# Patient Record
Sex: Male | Born: 1950
Health system: Southern US, Community
[De-identification: ages and names within clinical notes are randomized; demographics above are authoritative.]

## PROBLEM LIST (undated history)

## (undated) DIAGNOSIS — Z87891 Personal history of nicotine dependence: Principal | ICD-10-CM

## (undated) DIAGNOSIS — I10 Essential (primary) hypertension: Secondary | ICD-10-CM

## (undated) DIAGNOSIS — E785 Hyperlipidemia, unspecified: Secondary | ICD-10-CM

## (undated) DIAGNOSIS — Z8639 Personal history of other endocrine, nutritional and metabolic disease: Secondary | ICD-10-CM

## (undated) DIAGNOSIS — G4730 Sleep apnea, unspecified: Secondary | ICD-10-CM

## (undated) DIAGNOSIS — I2510 Atherosclerotic heart disease of native coronary artery without angina pectoris: Secondary | ICD-10-CM

## (undated) DIAGNOSIS — I4821 Permanent atrial fibrillation: Secondary | ICD-10-CM

## (undated) DIAGNOSIS — F32A Depression, unspecified: Secondary | ICD-10-CM

## (undated) DIAGNOSIS — K219 Gastro-esophageal reflux disease without esophagitis: Secondary | ICD-10-CM

## (undated) DIAGNOSIS — Z9289 Personal history of other medical treatment: Secondary | ICD-10-CM

## (undated) DIAGNOSIS — F329 Major depressive disorder, single episode, unspecified: Secondary | ICD-10-CM

## (undated) HISTORY — DX: Personal history of other medical treatment: Z92.89

## (undated) HISTORY — PX: HERNIA REPAIR: SHX51

## (undated) HISTORY — PX: TUMOR EXCISION: SHX421

## (undated) HISTORY — PX: APPENDECTOMY: SHX54

## (undated) HISTORY — PX: ANKLE SURGERY: SHX546

## (undated) HISTORY — DX: Personal history of nicotine dependence: Z87.891

## (undated) HISTORY — DX: Permanent atrial fibrillation: I48.21

---

## 2005-08-29 ENCOUNTER — Other Ambulatory Visit: Payer: Self-pay

## 2005-08-29 ENCOUNTER — Observation Stay: Payer: Self-pay | Admitting: Internal Medicine

## 2009-04-21 ENCOUNTER — Ambulatory Visit: Payer: Self-pay | Admitting: Surgery

## 2009-04-28 ENCOUNTER — Ambulatory Visit: Payer: Self-pay | Admitting: Surgery

## 2013-03-11 LAB — LIPID PANEL
Cholesterol: 162 mg/dL (ref 0–200)
HDL: 42 mg/dL (ref 35–70)
LDL Cholesterol: 110 mg/dL
LDL/HDL RATIO: 2.6
Triglycerides: 49 mg/dL (ref 40–160)

## 2013-03-11 LAB — HEPATIC FUNCTION PANEL
ALK PHOS: 76 U/L (ref 25–125)
ALT: 32 U/L (ref 10–40)
AST: 28 U/L (ref 14–40)
Bilirubin, Total: 1.6 mg/dL

## 2013-03-11 LAB — CBC AND DIFFERENTIAL
HCT: 47 % (ref 41–53)
Hemoglobin: 15.8 g/dL (ref 13.5–17.5)
Neutrophils Absolute: 63 /uL
Platelets: 119 10*3/uL — AB (ref 150–399)
WBC: 5.5 10^3/mL

## 2013-03-11 LAB — TSH: TSH: 1.84 u[IU]/mL (ref <=5.90)

## 2013-03-11 LAB — BASIC METABOLIC PANEL
BUN: 16 mg/dL (ref 4–21)
Creatinine: 0.9 mg/dL (ref <=1.3)
Glucose: 93 mg/dL
POTASSIUM: 4.3 mmol/L (ref 3.4–5.3)
Sodium: 144 mmol/L (ref 137–147)

## 2013-03-11 LAB — PSA: PSA: 0.5

## 2015-01-27 DIAGNOSIS — Z87891 Personal history of nicotine dependence: Secondary | ICD-10-CM | POA: Insufficient documentation

## 2015-01-27 DIAGNOSIS — G4700 Insomnia, unspecified: Secondary | ICD-10-CM | POA: Insufficient documentation

## 2015-01-27 DIAGNOSIS — K219 Gastro-esophageal reflux disease without esophagitis: Secondary | ICD-10-CM | POA: Insufficient documentation

## 2015-01-27 DIAGNOSIS — I2510 Atherosclerotic heart disease of native coronary artery without angina pectoris: Secondary | ICD-10-CM | POA: Insufficient documentation

## 2015-01-27 DIAGNOSIS — E669 Obesity, unspecified: Secondary | ICD-10-CM | POA: Insufficient documentation

## 2015-01-27 DIAGNOSIS — D179 Benign lipomatous neoplasm, unspecified: Secondary | ICD-10-CM | POA: Insufficient documentation

## 2015-01-27 DIAGNOSIS — G4733 Obstructive sleep apnea (adult) (pediatric): Secondary | ICD-10-CM | POA: Insufficient documentation

## 2015-01-27 DIAGNOSIS — F32A Depression, unspecified: Secondary | ICD-10-CM | POA: Insufficient documentation

## 2015-01-27 DIAGNOSIS — I10 Essential (primary) hypertension: Secondary | ICD-10-CM | POA: Insufficient documentation

## 2015-01-27 DIAGNOSIS — F329 Major depressive disorder, single episode, unspecified: Secondary | ICD-10-CM | POA: Insufficient documentation

## 2015-01-27 DIAGNOSIS — E785 Hyperlipidemia, unspecified: Secondary | ICD-10-CM | POA: Insufficient documentation

## 2015-02-01 ENCOUNTER — Ambulatory Visit: Payer: Self-pay | Admitting: Family Medicine

## 2015-02-02 ENCOUNTER — Ambulatory Visit (INDEPENDENT_AMBULATORY_CARE_PROVIDER_SITE_OTHER): Payer: Medicare Other | Admitting: Family Medicine

## 2015-02-02 ENCOUNTER — Encounter: Payer: Self-pay | Admitting: Family Medicine

## 2015-02-02 VITALS — BP 168/100 | HR 68 | Temp 98.7°F | Resp 14 | Ht 69.0 in | Wt 217.0 lb

## 2015-02-02 DIAGNOSIS — I10 Essential (primary) hypertension: Secondary | ICD-10-CM

## 2015-02-02 DIAGNOSIS — F32A Depression, unspecified: Secondary | ICD-10-CM

## 2015-02-02 DIAGNOSIS — F329 Major depressive disorder, single episode, unspecified: Secondary | ICD-10-CM

## 2015-02-02 DIAGNOSIS — K4090 Unilateral inguinal hernia, without obstruction or gangrene, not specified as recurrent: Secondary | ICD-10-CM | POA: Diagnosis not present

## 2015-02-02 NOTE — Progress Notes (Signed)
Patient ID: Louis Luna, male   DOB: 26-Mar-1951, 64 y.o.   MRN: [id number]   Patient: Louis Luna Male    DOB: August 11, 1951   64 y.o.   MRN: [id number] Visit Date: 02/02/2015  Today's Provider: [name], MD   Chief Complaint  Patient presents with  . Skin Problem   Subjective:    HPI   Patient states he has some irritation in the lower right abdomen and close to right groin area. He notices burning sensation there with prolong standing. This does not bother him daily but comes and goes. No rash noted. He does state he had hernia surgery about 6 years ago and wonders if this sensation related to the scar. He does mention that he has MESH there. This has been bothering him for about 3 to 4 months.   Previous Medications   ASPIRIN 81 MG TABLET    Take by mouth.   ATENOLOL (TENORMIN) 50 MG TABLET    Take by mouth.   CETIRIZINE (ZYRTEC) 10 MG TABLET    Take by mouth.   CINNAMON 500 MG CAPSULE    Take by mouth.   DIAZEPAM (VALIUM) 5 MG TABLET    Take by mouth.   MULTIPLE VITAMIN PO    Take by mouth.   PSYLLIUM 48.57 % POWD    Take by mouth.   SERTRALINE (ZOLOFT) 50 MG TABLET    Take by mouth.    Review of Systems  Constitutional: Negative.   Respiratory: Negative.   Cardiovascular: Negative.   Genitourinary: Negative.   Skin: Negative.     History  Substance Use Topics  . Smoking status: Former Smoker -- 1.00 packs/day for 30 years    Types: Cigarettes    Quit date: 10/26/2011  . Smokeless tobacco: Never Used  . Alcohol Use: Yes     Comment: ocassionally-beer   Objective:   BP 168/100 mmHg  Pulse 68  Temp(Src) 98.7 F (37.1 C)  Resp 14  Ht 5\' 9"  (1.753 m)  Wt 217 lb (98.431 kg)  BMI 32.03 kg/m2  Physical Exam  Constitutional: He appears well-developed and well-nourished.  Eyes: Conjunctivae are normal. Pupils are equal, round, and reactive to light.  Neck: Normal range of motion. Neck supple.  Cardiovascular: Normal rate, regular rhythm, normal  heart sounds and intact distal pulses.   No murmur heard. Pulmonary/Chest: Effort normal and breath sounds normal.  Abdominal: Soft. There is no tenderness. A hernia is present. Hernia confirmed positive in the right inguinal area (moderate size present).  Genitourinary: Penis normal.  Skin: Skin is dry. No rash noted. No erythema.        Assessment & Plan:     Inguinal hernia, right - New. Plan: Ambulatory referral to General Surgery. Discussed with patient that he does not have to have surgery but it would be good t ogo for consultation. He did have hernia surgery in 2010. Refer back to Dr. Tamala Julian.  Essential hypertension -elevated today. Patient has not taking his B/P medication yet this morning. Advised patient to exercise as he wanted today and right after that take his medication. Follow for now. No changes. Re check on the next visit.  Clinical depression-stable.     Follow up: 4 months.

## 2015-03-13 ENCOUNTER — Other Ambulatory Visit: Payer: Self-pay | Admitting: Family Medicine

## 2015-03-22 ENCOUNTER — Other Ambulatory Visit: Payer: Self-pay | Admitting: Family Medicine

## 2015-04-04 ENCOUNTER — Telehealth: Payer: Self-pay | Admitting: Family Medicine

## 2015-04-04 NOTE — Telephone Encounter (Signed)
Pt's wife would like Dr. Rosanna Randy to write a letter stating that due to pt's hernia he can not travel. Wife stated that they were supposed to go on a cruise in September but because of pt having the hernia they can not go. Wife stated that pt is going to have surgery but hasn't scheduled it and he hasn't seen anyone other than Dr. Rosanna Randy in June about the hernia. Wife stated they need the letter to give to the company they booked the cruise with. Thanks TNP

## 2015-04-04 NOTE — Telephone Encounter (Signed)
See below please-aa 

## 2015-04-15 NOTE — Telephone Encounter (Signed)
Okay to write a letter for me to sign saying that he has a hernia. I'm not sure I cannot not told him he could not go on a cruise. He does need to follow-up with surgery and make a plan for how to deal with this.

## 2015-04-20 NOTE — Telephone Encounter (Signed)
Pt's wife returned Ana's call and would like a call back. Thanks TNP

## 2015-04-20 NOTE — Telephone Encounter (Signed)
[id number] 

## 2015-04-20 NOTE — Telephone Encounter (Signed)
Spoke with [name], wife, advised as below. No note needed-aa

## 2015-05-04 ENCOUNTER — Encounter: Payer: Self-pay | Admitting: Emergency Medicine

## 2015-05-04 ENCOUNTER — Inpatient Hospital Stay
Admission: EM | Admit: 2015-05-04 | Discharge: 2015-05-08 | DRG: 502 | Disposition: A | Discharge status: Home Health | Payer: Medicare Other | Attending: Orthopedic Surgery | Admitting: Orthopedic Surgery

## 2015-05-04 ENCOUNTER — Inpatient Hospital Stay: Payer: Medicare Other

## 2015-05-04 ENCOUNTER — Telehealth: Payer: Self-pay | Admitting: Family Medicine

## 2015-05-04 ENCOUNTER — Emergency Department: Payer: Medicare Other

## 2015-05-04 DIAGNOSIS — F329 Major depressive disorder, single episode, unspecified: Secondary | ICD-10-CM | POA: Diagnosis present

## 2015-05-04 DIAGNOSIS — Z803 Family history of malignant neoplasm of breast: Secondary | ICD-10-CM | POA: Diagnosis not present

## 2015-05-04 DIAGNOSIS — M25562 Pain in left knee: Secondary | ICD-10-CM | POA: Diagnosis not present

## 2015-05-04 DIAGNOSIS — Z87891 Personal history of nicotine dependence: Secondary | ICD-10-CM

## 2015-05-04 DIAGNOSIS — S76119A Strain of unspecified quadriceps muscle, fascia and tendon, initial encounter: Secondary | ICD-10-CM | POA: Diagnosis present

## 2015-05-04 DIAGNOSIS — Z7982 Long term (current) use of aspirin: Secondary | ICD-10-CM | POA: Diagnosis not present

## 2015-05-04 DIAGNOSIS — I10 Essential (primary) hypertension: Secondary | ICD-10-CM | POA: Diagnosis present

## 2015-05-04 DIAGNOSIS — Z820 Family history of epilepsy and other diseases of the nervous system: Secondary | ICD-10-CM | POA: Diagnosis not present

## 2015-05-04 DIAGNOSIS — G4733 Obstructive sleep apnea (adult) (pediatric): Secondary | ICD-10-CM | POA: Diagnosis present

## 2015-05-04 DIAGNOSIS — E785 Hyperlipidemia, unspecified: Secondary | ICD-10-CM | POA: Diagnosis present

## 2015-05-04 DIAGNOSIS — T148XXA Other injury of unspecified body region, initial encounter: Secondary | ICD-10-CM

## 2015-05-04 DIAGNOSIS — Z79899 Other long term (current) drug therapy: Secondary | ICD-10-CM

## 2015-05-04 DIAGNOSIS — Z8249 Family history of ischemic heart disease and other diseases of the circulatory system: Secondary | ICD-10-CM

## 2015-05-04 DIAGNOSIS — W1781XA Fall down embankment (hill), initial encounter: Secondary | ICD-10-CM | POA: Diagnosis present

## 2015-05-04 DIAGNOSIS — Z811 Family history of alcohol abuse and dependence: Secondary | ICD-10-CM | POA: Diagnosis not present

## 2015-05-04 DIAGNOSIS — Z833 Family history of diabetes mellitus: Secondary | ICD-10-CM | POA: Diagnosis not present

## 2015-05-04 DIAGNOSIS — S76112A Strain of left quadriceps muscle, fascia and tendon, initial encounter: Secondary | ICD-10-CM | POA: Diagnosis present

## 2015-05-04 DIAGNOSIS — Y9353 Activity, golf: Secondary | ICD-10-CM | POA: Diagnosis not present

## 2015-05-04 DIAGNOSIS — I2510 Atherosclerotic heart disease of native coronary artery without angina pectoris: Secondary | ICD-10-CM | POA: Diagnosis present

## 2015-05-04 DIAGNOSIS — Y929 Unspecified place or not applicable: Secondary | ICD-10-CM | POA: Diagnosis not present

## 2015-05-04 LAB — COMPREHENSIVE METABOLIC PANEL
ALBUMIN: 4.4 g/dL (ref 3.5–5.0)
ALT: 26 U/L (ref 17–63)
AST: 36 U/L (ref 15–41)
Alkaline Phosphatase: 75 U/L (ref 38–126)
Anion gap: 8 (ref 5–15)
BUN: 20 mg/dL (ref 6–20)
CHLORIDE: 105 mmol/L (ref 101–111)
CO2: 26 mmol/L (ref 22–32)
CREATININE: 0.87 mg/dL (ref 0.61–1.24)
Calcium: 9.1 mg/dL (ref 8.9–10.3)
GFR calc Af Amer: >60 mL/min (ref >60)
GLUCOSE: 92 mg/dL (ref 65–99)
POTASSIUM: 4.3 mmol/L (ref 3.5–5.1)
Sodium: 139 mmol/L (ref 135–145)
Total Bilirubin: 1.5 mg/dL — ABNORMAL HIGH (ref 0.3–1.2)
Total Protein: 7 g/dL (ref 6.5–8.1)

## 2015-05-04 LAB — CBC
HEMATOCRIT: 41.7 % (ref 40.0–52.0)
HEMOGLOBIN: 14.1 g/dL (ref 13.0–18.0)
MCH: 30 pg (ref 26.0–34.0)
MCHC: 33.7 g/dL (ref 32.0–36.0)
MCV: 89 fL (ref 80.0–100.0)
Platelets: 123 10*3/uL — ABNORMAL LOW (ref 150–440)
RBC: 4.68 MIL/uL (ref 4.40–5.90)
RDW: 17.2 % — ABNORMAL HIGH (ref 11.5–14.5)
WBC: 7.5 10*3/uL (ref 3.8–10.6)

## 2015-05-04 LAB — MRSA PCR SCREENING: MRSA by PCR: NEGATIVE

## 2015-05-04 LAB — PROTIME-INR
INR: 1.37
PROTHROMBIN TIME: 17.1 s — AB (ref 11.4–15.0)

## 2015-05-04 MED ORDER — CHLORHEXIDINE GLUCONATE 4 % EX LIQD: 60.0000 mL | Freq: Once | CUTANEOUS | Status: DC

## 2015-05-04 MED ORDER — SERTRALINE HCL 50 MG PO TABS
50.0000 mg | ORAL_TABLET | Freq: Every day | ORAL | Status: DC
Start: 2015-05-05 — End: 2015-05-08
  Filled 2015-05-04 (×2): qty 1

## 2015-05-04 MED ORDER — ATENOLOL 50 MG PO TABS
50.0000 mg | ORAL_TABLET | Freq: Every day | ORAL | Status: DC
  Administered 2015-05-05: 50 mg via ORAL
  Filled 2015-05-04 (×3): qty 1

## 2015-05-04 MED ORDER — TRAMADOL HCL 50 MG PO TABS
50.0000 mg | ORAL_TABLET | Freq: Once | ORAL | Status: AC
  Administered 2015-05-04: 50 mg via ORAL
  Filled 2015-05-04: qty 1

## 2015-05-04 MED ORDER — MORPHINE SULFATE (PF) 2 MG/ML IV SOLN
0.5000 mg | INTRAVENOUS | Status: DC | PRN
  Administered 2015-05-06: 0.5 mg via INTRAVENOUS
  Filled 2015-05-04: qty 1

## 2015-05-04 MED ORDER — IBUPROFEN 800 MG PO TABS
800.0000 mg | ORAL_TABLET | Freq: Once | ORAL | Status: AC
  Administered 2015-05-04: 800 mg via ORAL
  Filled 2015-05-04: qty 1

## 2015-05-04 MED ORDER — DIAZEPAM 5 MG PO TABS
5.0000 mg | ORAL_TABLET | Freq: Every day | ORAL | Status: DC | PRN
  Filled 2015-05-04: qty 1

## 2015-05-04 MED ORDER — IBUPROFEN 800 MG PO TABS: 800.0000 mg | ORAL_TABLET | Freq: Three times a day (TID) | ORAL | Status: DC | PRN

## 2015-05-04 MED ORDER — SODIUM CHLORIDE 0.9 % IV SOLN
INTRAVENOUS | Status: DC
  Administered 2015-05-04 – 2015-05-05 (×4): via INTRAVENOUS

## 2015-05-04 MED ORDER — HYDROCODONE-ACETAMINOPHEN 5-325 MG PO TABS
1.0000 | ORAL_TABLET | Freq: Four times a day (QID) | ORAL | Status: DC | PRN
  Administered 2015-05-05 (×2): 1 via ORAL
  Administered 2015-05-06: 2 via ORAL
  Administered 2015-05-06 (×2): 1 via ORAL
  Administered 2015-05-06: 2 via ORAL
  Filled 2015-05-04: qty 1
  Filled 2015-05-04: qty 2
  Filled 2015-05-04: qty 1
  Filled 2015-05-04: qty 2
  Filled 2015-05-04 (×2): qty 1

## 2015-05-04 MED ORDER — TRAMADOL HCL 50 MG PO TABS: 50.0000 mg | ORAL_TABLET | Freq: Four times a day (QID) | ORAL | Status: DC | PRN

## 2015-05-04 MED ORDER — SODIUM CHLORIDE 0.9 % IV SOLN: INTRAVENOUS | Status: DC

## 2015-05-04 MED ORDER — CEFAZOLIN SODIUM-DEXTROSE 2-3 GM-% IV SOLR
2.0000 g | INTRAVENOUS | Status: DC
  Filled 2015-05-04 (×2): qty 50

## 2015-05-04 NOTE — H&P (Signed)
Subjective:   Patient is a 64 y.o. male presents with left knee injury while golfing. He was stepping down a slope after having a ball go off to the side of the course, and felt a pop and went down with left knee pain. He's been unable to actively extend the knee since then and has noticed swelling and pain. Onset of symptoms was abrupt starting 2 hours ago with unchanged course since that time. The pain is located in the anterior left knee. Patient describes the pain as sharp continuous and rated as severe. Pain has been associated with swelling. Patient denies loss of consciousness. Symptoms are aggravated by any motion of the knee. Symptoms improve with maintaining knee extension. Past history includes no prior left knee surgery.  Previous studies include knee x-rays today showing apparent avulsion injury of the quadriceps.  Patient Active Problem List   Diagnosis Date Noted  . Quadriceps muscle rupture 05/04/2015  . Atherosclerosis of coronary artery 01/27/2015  . Clinical depression 01/27/2015  . Acid reflux 01/27/2015  . History of tobacco use 01/27/2015  . HLD (hyperlipidemia) 01/27/2015  . BP (high blood pressure) 01/27/2015  . Fatty tumor 01/27/2015  . Obstructive sleep apnea 01/27/2015   No past medical history on file.  Past Surgical History  Procedure Laterality Date  . Hernia repair      inguinal-left 04/2009  . Tumor excision      from Rt side of the neck-benign  . Ankle surgery    . Appendectomy      Prescriptions prior to admission  Medication Sig Dispense Refill Last Dose  . aspirin EC 81 MG tablet Take 81 mg by mouth daily.   05/04/2015 at 0800  . atenolol (TENORMIN) 50 MG tablet Take 50 mg by mouth daily.   05/04/2015 at 0800  . diazepam (VALIUM) 5 MG tablet Take 5 mg by mouth daily as needed for anxiety.   05/04/2015 at Unknown time  . Multiple Vitamin (MULTIVITAMIN WITH MINERALS) TABS tablet Take 1 tablet by mouth daily.   05/04/2015 at Unknown time  . psyllium (METAMUCIL  SMOOTH TEXTURE) 28 % packet Take 1 packet by mouth daily.   05/04/2015 at Unknown time  . sertraline (ZOLOFT) 50 MG tablet Take 50 mg by mouth daily.   05/04/2015 at Unknown time   No Known Allergies  Social History  Substance Use Topics  . Smoking status: Former Smoker -- 1.00 packs/day for 30 years    Types: Cigarettes    Quit date: 10/26/2011  . Smokeless tobacco: Never Used  . Alcohol Use: Yes     Comment: ocassionally-beer    Family History  Problem Relation Age of Onset  . Diabetes Mother   . Hypertension Mother   . Breast cancer Mother   . Alcohol abuse Father   . Hypertension Father   . Alzheimer's disease Father   . Alcohol abuse Brother   . Hypertension Brother   . Breast cancer Sister   . Melanoma Sister   . Depression Sister   . Breast cancer Sister   . Depression Sister   . Hypertension Brother     Review of Systems Pertinent items are noted in HPI.  Objective:   Patient Vitals for the past 8 hrs:  BP Temp Temp src Pulse Resp SpO2 Height Weight  05/04/15 1642 133/73 mmHg 97.6 F (36.4 C) Oral 73 16 100 % - -  05/04/15 1611 134/75 mmHg - - 68 16 98 % - -  05/04/15 1316 139/82  mmHg 97.6 F (36.4 C) Oral 65 18 98 % 5\' 8"  (1.727 m) 97.523 kg (215 lb)     Total I/O In: -  Out: 300 [Urine:300]    BP 133/73 mmHg  Pulse 73  Temp(Src) 97.6 F (36.4 C) (Oral)  Resp 16  Ht 5\' 8"  (1.727 m)  Wt 97.523 kg (215 lb)  BMI 32.70 kg/m2  SpO2 100%  General Appearance:    Alert, cooperative, no distress, appears stated age  Head:    Normocephalic, without obvious abnormality, atraumatic  Eyes:    PERRL, conjunctiva/corneas clear, EOM's intact, fundi    benign, both eyes       Ears:    Normal TM's and external ear canals, both ears  Nose:   Nares normal, septum midline, mucosa normal, no drainage    or sinus tenderness  Throat:   Lips, mucosa, and tongue normal; teeth and gums normal  Neck:   Supple, symmetrical, trachea midline, no adenopathy;       thyroid:   No enlargement/tenderness/nodules; no carotid   bruit or JVD  Back:     Symmetric, no curvature, ROM normal, no CVA tenderness  Lungs:     Clear to auscultation bilaterally, respirations unlabored  Chest wall:    No tenderness or deformity  Heart:    Regular rate and rhythm, S1 and S2 normal, no murmur, rub   or gallop  Abdomen:     Soft, non-tender, bowel sounds active all four quadrants,    no masses, no organomegaly  Genitalia:    Normal male without lesion, discharge or tenderness  Rectal:    Normal tone, normal prostate, no masses or tenderness;   guaiac negative stool  Extremities:   palpable dorsalis pedis and posterior tibial pulse to the left lower extremity. Skin is intact there is a large knee effusion and a palpable defect proximal to the patella with patella baja, left patella is approximately 3 cm distal compared to the right. He is unable to maintain active extension of the knee has gravity   Pulses:   2+ and symmetric all extremities  Skin:   Skin color, texture, turgor normal, no rashes or lesions  Lymph nodes:   Cervical, supraclavicular, and axillary nodes normal  Neurologic:   CNII-XII intact. Normal strength, sensation and reflexes      throughout    ECG: EKG reading pending.  Data ReviewRadiology review: Quadriceps injury with patella baja  Assessment:   Active Problems:   Quadriceps muscle rupture  loss of extensor mechanism necessitating repair to allow him to ambulate  Plan:   Plan on quadriceps repair tomorrow with multiple suture anchors. Risks benefits possible competitions were discussed with the patient. Discussed extensive postop rehabilitation and potential for rerupture

## 2015-05-04 NOTE — ED Provider Notes (Signed)
[facility] Emergency Department Provider Note  ____________________________________________  Time seen: Approximately 1:42 PM  I have reviewed the triage vital signs and the nursing notes.   HISTORY  Chief Complaint Knee Pain    HPI Louis Luna is a 64 y.o. male patient complaining of left knee pain secondary to a slipping incident while playing golf. Patient stated he was going down an embankment pickup golf ball when his right leg slipped and  severe twisting incident to the left knee. Left knee immediately swollen. Instead occurred approximately 2 hours ago. Patient states pain is currently at a 3/10 but immediately increases to a 7/10 with extension and dorsiflexion of the right lower extremity. Patient state difficult to bear weight. No palliative measures for this complaint.  No past medical history on file.  Patient Active Problem List   Diagnosis Date Noted  . Atherosclerosis of coronary artery 01/27/2015  . Clinical depression 01/27/2015  . Acid reflux 01/27/2015  . History of tobacco use 01/27/2015  . HLD (hyperlipidemia) 01/27/2015  . BP (high blood pressure) 01/27/2015  . Fatty tumor 01/27/2015  . Obstructive sleep apnea 01/27/2015    Past Surgical History  Procedure Laterality Date  . Hernia repair      inguinal-left 04/2009  . Tumor excision      from Rt side of the neck-benign  . Ankle surgery    . Appendectomy      Current Outpatient Rx  Name  Route  Sig  Dispense  Refill  . aspirin 81 MG tablet   Oral   Take by mouth.         Marland Kitchen atenolol (TENORMIN) 50 MG tablet      TAKE ONE TABLET BY MOUTH EVERY DAY   90 tablet   2   . cetirizine (ZYRTEC) 10 MG tablet   Oral   Take by mouth.         . Cinnamon 500 MG capsule   Oral   Take by mouth.         . diazepam (VALIUM) 5 MG tablet      TAKE ONE TABLET BY MOUTH NIGHTLY AT BEDTIME AS NEEDED FOR SLEEP   30 tablet   5   . ibuprofen (ADVIL,MOTRIN) 800 MG tablet   Oral   Take 1 tablet (800 mg total) by mouth every 8 (eight) hours as needed for moderate pain.   15 tablet   0   . MULTIPLE VITAMIN PO   Oral   Take by mouth.         . Psyllium 48.57 % POWD   Oral   Take by mouth.         . sertraline (ZOLOFT) 50 MG tablet      TAKE ONE TABLET BY MOUTH EVERY DAY   30 tablet   11   . traMADol (ULTRAM) 50 MG tablet   Oral   Take 1 tablet (50 mg total) by mouth every 6 (six) hours as needed.   20 tablet   0     Allergies Review of patient's allergies indicates no known allergies.  Family History  Problem Relation Age of Onset  . Diabetes Mother   . Hypertension Mother   . Breast cancer Mother   . Alcohol abuse Father   . Hypertension Father   . Alzheimer's disease Father   . Alcohol abuse Brother   . Hypertension Brother   . Breast cancer Sister   . Melanoma Sister   . Depression  Sister   . Breast cancer Sister   . Depression Sister   . Hypertension Brother     Social History Social History  Substance Use Topics  . Smoking status: Former Smoker -- 1.00 packs/day for 30 years    Types: Cigarettes    Quit date: 10/26/2011  . Smokeless tobacco: Never Used  . Alcohol Use: Yes     Comment: ocassionally-beer    Review of Systems Constitutional: No fever/chills Eyes: No visual changes. ENT: No sore throat. Cardiovascular: Denies chest pain. Respiratory: Denies shortness of breath. Gastrointestinal: No abdominal pain.  No nausea, no vomiting.  No diarrhea.  No constipation. Genitourinary: Negative for dysuria. Musculoskeletal: Left knee pain and edema Skin: Negative for rash. Neurological: Negative for headaches, focal weakness or numbness. 10-point ROS otherwise negative.  ____________________________________________   PHYSICAL EXAM:  VITAL SIGNS: ED Triage Vitals  Enc Vitals Group     BP 05/04/15 1316 139/82 mmHg     Pulse Rate 05/04/15 1316 65     Resp 05/04/15 1316 18     Temp 05/04/15 1316 97.6 F  (36.4 C)     Temp Source 05/04/15 1316 Oral     SpO2 05/04/15 1316 98 %     Weight 05/04/15 1316 215 lb (97.523 kg)     Height 05/04/15 1316 5\' 8"  (1.727 m)     Head Cir --      Peak Flow --      Pain Score 05/04/15 1321 3     Pain Loc --      Pain Edu? --      Excl. in [place]? --     Constitutional: Alert and oriented. Well appearing and in no acute distress. Eyes: Conjunctivae are normal. PERRL. EOMI. Head: Atraumatic. Nose: No congestion/rhinnorhea. Mouth/Throat: Mucous membranes are moist.  Oropharynx non-erythematous. Neck: No stridor.  No cervical spine tenderness to palpation. Hematological/Lymphatic/Immunilogical: No cervical lymphadenopathy. Cardiovascular: Normal rate, regular rhythm. Grossly normal heart sounds.  Good peripheral circulation. Respiratory: Normal respiratory effort.  No retractions. Lungs CTAB. Gastrointestinal: Soft and nontender. No distention. No abdominal bruits. No CVA tenderness. Musculoskeletal: No obvious deformity but obvious edema to superior aspect of the left patella. Tenderness palpation insertion point the superior patella tendon insertion point. Decreased range of motion with flexion.. Neurologic:  Normal speech and language. No gross focal neurologic deficits are appreciated. No gait instability. Skin:  Skin is warm, dry and intact. No rash noted. Psychiatric: Mood and affect are normal. Speech and behavior are normal.  ____________________________________________   LABS (all labs ordered are listed, but only abnormal results are displayed)  Labs Reviewed - No data to display ____________________________________________  EKG   ____________________________________________  RADIOLOGY avulsion fracture of the superior pole of the patella. I, Sable Feil, personally viewed and evaluated these images (plain radiographs) as part of my medical decision making.   ____________________________________________   PROCEDURES  Procedure(s)  performed: None  Critical Care performed: No  ____________________________________________   INITIAL IMPRESSION / ASSESSMENT AND PLAN / ED COURSE  Pertinent labs & imaging results that were available during my care of the patient were reviewed by me and considered in my medical decision making (see chart for details).  Avulsion fracture superior aspect of left patella. Discussed patient with Dr. Rudene Christians  who advised knee immobilizer and crutches and follow-up in his office in 2 days. State MRI is not needed when asked about getting study before discharge. Patient given prescription for ibuprofen and tramadol.Dr. Rudene Christians came to exam patient  and determine there is a patella tendon rupture and will admit patient. ____________________________________________   FINAL CLINICAL IMPRESSION(S) / ED DIAGNOSES  Final diagnoses:  Avulsion fracture      Sable Feil, PA-C 05/04/15 [place], PA-C 05/04/15 [place], PA-C 05/04/15 1548  Nena Polio, MD 05/04/15 [phone]

## 2015-05-04 NOTE — ED Notes (Signed)
Pt was playing golf, when he slipped and injured left knee. Has pain just above left knee.

## 2015-05-04 NOTE — Telephone Encounter (Signed)
Pt's wife called saying he is scheduled for tendon reattached on left  Knee tomorrow.  He would like for you to call him regarding his surgery.  (per wife,  he just wanted to talk to you before surgery)  His call back is  [phone]  [facility]

## 2015-05-04 NOTE — Telephone Encounter (Signed)
Pt reports that Dr. Rudene Christians will be operating on pt's left knee tomorrow for a "ruptured tendon". States the time has not yet been scheduled, and is requesting your opinion on the surgeon. CB# [phone]. Renaldo Fiddler, CMA

## 2015-05-05 ENCOUNTER — Inpatient Hospital Stay: Payer: Medicare Other | Admitting: Anesthesiology

## 2015-05-05 ENCOUNTER — Encounter
Admission: EM | Disposition: A | Discharge status: Home Health | Payer: Self-pay | Source: Home / Self Care | Attending: Orthopedic Surgery

## 2015-05-05 ENCOUNTER — Encounter: Payer: Self-pay | Admitting: Anesthesiology

## 2015-05-05 HISTORY — PX: QUADRICEPS TENDON REPAIR: SHX756

## 2015-05-05 SURGERY — REPAIR, TENDON, QUADRICEPS
Anesthesia: General | Site: Knee | Laterality: Left | Wound class: Clean

## 2015-05-05 MED ORDER — FENTANYL CITRATE (PF) 100 MCG/2ML IJ SOLN: 25.0000 ug | INTRAMUSCULAR | Status: DC | PRN

## 2015-05-05 MED ORDER — ROCURONIUM BROMIDE 100 MG/10ML IV SOLN
INTRAVENOUS | Status: DC | PRN
  Administered 2015-05-05: 25 mg via INTRAVENOUS
  Administered 2015-05-05: 5 mg via INTRAVENOUS

## 2015-05-05 MED ORDER — SODIUM CHLORIDE 0.9 % IV SOLN: INTRAVENOUS | Status: DC

## 2015-05-05 MED ORDER — ONDANSETRON HCL 4 MG/2ML IJ SOLN
INTRAMUSCULAR | Status: DC | PRN
  Administered 2015-05-05: 4 mg via INTRAVENOUS

## 2015-05-05 MED ORDER — ACETAMINOPHEN 10 MG/ML IV SOLN
INTRAVENOUS | Status: DC | PRN
  Administered 2015-05-05: 1000 mg via INTRAVENOUS

## 2015-05-05 MED ORDER — ONDANSETRON HCL 4 MG/2ML IJ SOLN
4.0000 mg | Freq: Once | INTRAMUSCULAR | Status: AC | PRN
  Administered 2015-05-05: 4 mg via INTRAVENOUS

## 2015-05-05 MED ORDER — LIDOCAINE HCL (CARDIAC) 20 MG/ML IV SOLN
INTRAVENOUS | Status: DC | PRN
  Administered 2015-05-05: 100 mg via INTRAVENOUS

## 2015-05-05 MED ORDER — PROPOFOL 10 MG/ML IV BOLUS
INTRAVENOUS | Status: DC | PRN
  Administered 2015-05-05: 180 mg via INTRAVENOUS

## 2015-05-05 MED ORDER — PROMETHAZINE HCL 25 MG/ML IJ SOLN
12.5000 mg | Freq: Once | INTRAMUSCULAR | Status: AC
  Administered 2015-05-05: 12.5 mg via INTRAVENOUS

## 2015-05-05 MED ORDER — BUPIVACAINE-EPINEPHRINE (PF) 0.5% -1:200000 IJ SOLN
INTRAMUSCULAR | Status: DC | PRN
  Administered 2015-05-05: 30 mL via PERINEURAL

## 2015-05-05 MED ORDER — HYDROMORPHONE HCL 1 MG/ML IJ SOLN
INTRAMUSCULAR | Status: DC | PRN
  Administered 2015-05-05: 1 mg via INTRAVENOUS

## 2015-05-05 MED ORDER — KETOROLAC TROMETHAMINE 30 MG/ML IJ SOLN
INTRAMUSCULAR | Status: DC | PRN
  Administered 2015-05-05: 30 mg via INTRAVENOUS

## 2015-05-05 MED ORDER — LACTATED RINGERS IV SOLN
INTRAVENOUS | Status: DC | PRN
  Administered 2015-05-05: 17:00:00 via INTRAVENOUS

## 2015-05-05 MED ORDER — FENTANYL CITRATE (PF) 100 MCG/2ML IJ SOLN
INTRAMUSCULAR | Status: DC | PRN
  Administered 2015-05-05 (×2): 50 ug via INTRAVENOUS

## 2015-05-05 MED ORDER — CEFAZOLIN SODIUM-DEXTROSE 2-3 GM-% IV SOLR
INTRAVENOUS | Status: DC | PRN
  Administered 2015-05-05: 2 g via INTRAVENOUS

## 2015-05-05 MED ORDER — NEOMYCIN-POLYMYXIN B GU 40-200000 IR SOLN
Status: DC | PRN
  Administered 2015-05-05: 4 mL

## 2015-05-05 MED ORDER — MIDAZOLAM HCL 2 MG/2ML IJ SOLN
INTRAMUSCULAR | Status: DC | PRN
  Administered 2015-05-05: 2 mg via INTRAVENOUS

## 2015-05-05 MED ORDER — SUCCINYLCHOLINE CHLORIDE 20 MG/ML IJ SOLN
INTRAMUSCULAR | Status: DC | PRN
  Administered 2015-05-05: 120 mg via INTRAVENOUS

## 2015-05-05 SURGICAL SUPPLY — 33 items
ANCHOR SUPER #2 ORTHOCORD (MISCELLANEOUS) ×2 IMPLANT
ANCHOR SUT BIO SW 4.75X19.1 (Anchor) ×4 IMPLANT
BANDAGE ELASTIC 6 CLIP NS LF (GAUZE/BANDAGES/DRESSINGS) ×2 IMPLANT
BLADE CLIPPER SURG (BLADE) ×2 IMPLANT
BLADE SURG SZ10 CARB STEEL (BLADE) ×2 IMPLANT
BNDG COHESIVE 4X5 TAN STRL (GAUZE/BANDAGES/DRESSINGS) ×2 IMPLANT
BNDG ESMARK 4X12 TAN STRL LF (GAUZE/BANDAGES/DRESSINGS) ×2 IMPLANT
BRACE KNEE POST OP SHORT (BRACE) ×2 IMPLANT
CANISTER SUCT 1200ML W/VALVE (MISCELLANEOUS) ×2 IMPLANT
CHLORAPREP W/TINT 26ML (MISCELLANEOUS) ×2 IMPLANT
GAUZE PETRO XEROFOAM 1X8 (MISCELLANEOUS) ×6 IMPLANT
GAUZE SPONGE 4X4 12PLY STRL (GAUZE/BANDAGES/DRESSINGS) ×4 IMPLANT
GLOVE BIOGEL PI IND STRL 9 (GLOVE) ×1 IMPLANT
GLOVE BIOGEL PI INDICATOR 9 (GLOVE) ×1
GLOVE SURG ORTHO 9.0 STRL STRW (GLOVE) ×2 IMPLANT
GOWN SPECIALTY ULTRA XL (MISCELLANEOUS) ×2 IMPLANT
GOWN STRL REUS W/ TWL LRG LVL3 (GOWN DISPOSABLE) ×1 IMPLANT
GOWN STRL REUS W/TWL LRG LVL3 (GOWN DISPOSABLE) ×1
HANDLE YANKAUER SUCT BULB TIP (MISCELLANEOUS) ×2 IMPLANT
KIT RM TURNOVER STRD PROC AR (KITS) ×2 IMPLANT
NS IRRIG 1000ML POUR BTL (IV SOLUTION) ×2 IMPLANT
PACK EXTREMITY ARMC (MISCELLANEOUS) ×2 IMPLANT
PAD ABD DERMACEA PRESS 5X9 (GAUZE/BANDAGES/DRESSINGS) ×8 IMPLANT
PAD CAST CTTN 4X4 STRL (SOFTGOODS) ×1 IMPLANT
PAD GROUND ADULT SPLIT (MISCELLANEOUS) ×2 IMPLANT
PADDING CAST COTTON 4X4 STRL (SOFTGOODS) ×1
PASSER SUT SWANSON 36MM LOOP (INSTRUMENTS) ×2 IMPLANT
SPONGE LAP 18X18 5 PK (GAUZE/BANDAGES/DRESSINGS) ×2 IMPLANT
STAPLER SKIN PROX 35W (STAPLE) ×2 IMPLANT
STOCKINETTE M/LG 89821 (MISCELLANEOUS) ×2 IMPLANT
SUT TIGER TAPE 7 IN WHITE (SUTURE) ×2 IMPLANT
SUT VIC AB 2-0 CT1 27 (SUTURE) ×1
SUT VIC AB 2-0 CT1 TAPERPNT 27 (SUTURE) ×1 IMPLANT

## 2015-05-05 NOTE — OR Nursing (Signed)
Patient states that nausea has subsided some.

## 2015-05-05 NOTE — Progress Notes (Signed)
Pt. Returned from surgery with locked brace and ice pack on left leg. Minimal pain. VSS. Neurochecks WDL. Family at the bedside.

## 2015-05-05 NOTE — Transfer of Care (Signed)
Immediate Anesthesia Transfer of Care Note  Patient: Louis Luna  Procedure(s) Performed: Procedure(s): REPAIR QUADRICEP TENDON (Left)  Patient Location: PACU  Anesthesia Type:General  Level of Consciousness: awake, alert , oriented and patient cooperative  Airway & Oxygen Therapy: Patient Spontanous Breathing and Patient connected to nasal cannula oxygen  Post-op Assessment: Report given to RN and Post -op Vital signs reviewed and stable  Post vital signs: Reviewed and stable  Last Vitals:   Filed Vitals:   05/05/15 1832  BP: 147/70  Pulse:   Temp: 36.5 C  Resp: 12    Complications: No apparent anesthesia complications

## 2015-05-05 NOTE — Progress Notes (Signed)
Spoke with Louis Luna in Respiratory, pt has scheduled EKG for this morning.

## 2015-05-05 NOTE — Op Note (Signed)
05/04/2015 - 05/05/2015  6:32 PM  PATIENT:  Louis Luna  64 y.o. male  PRE-OPERATIVE DIAGNOSIS:  QUADRACEPS tendon REPAIR  POST-OPERATIVE DIAGNOSIS:  QUADRACEPS tendon REPAIR  PROCEDURE:  Procedure(s): REPAIR QUADRICEP TENDON (Left)  SURGEON: Laurene Footman, MD  ASSISTANTS: None  ANESTHESIA:   spinal  EBL:  Total I/O In: 700 [I.V.:700] Out: 625 [Urine:600; Blood:25]  BLOOD ADMINISTERED:none  DRAINS: none   LOCAL MEDICATIONS USED:  MARCAINE     SPECIMEN:  No Specimen  DISPOSITION OF SPECIMEN:  N/A  COUNTS:  YES  TOURNIQUET:  47 minutes at 300 mmHg  IMPLANTS: Arthritis bio composite swivel lock suture anchor 2  DICTATION: .Dragon Dictation  patient was brought to the operating room and after adequate general anesthesia was obtained, the left leg was prepped and draped in the sterile fashion with a tourniquet by the upper thigh. After patient identification and timeout procedures were completed, the tourniquet was raised to 300 mmHg. A MIDLINE SKIN INCISION WAS MADE AND THE PROXIMAL POLE THE PATELLA AND DISTAL QUADRICEPS IDENTIFIED THERE WAS A COMPLETE RUPTURE. THE PROXIMAL END OF THE PATELLA WAS DEBRIDED TO GET RID OF SOME FRAYED TENDON AND A HEAVY SUTURE TAPE WAS WOVEN THROUGH THE QUADRICEPS TENDON WITH KRAKW SUTURES. ON BOTH SIDES. 2 SUTURE ANCHORS WERE THEN PREPARED WITH FIRST DRILLING 20 MM HOLES AND THEN PASSING THE SUTURE FROM THE KRAKW SUTURES COMING OUT THROUGH THE END OF THE QUADRICEPS TENDON INTO EACH OF THESE THERE IS INSERTED INTO THE PATELLA WITH APPROPRIATE TENSION AND TIGHTENED AND THEN THE RE-RESIDUAL SUTURE WAS WOVEN THROUGH THE TENDON AS ON ADDITIONAL FIXATION. A SECOND SUTURE TAPE WAS USED TO REINFORCE THE REPAIR AND THE ETHIBOND SUTURE THAT CAME WITH THIS AS A PASSING DEVICE WAS USED TO REPAIR THE MEDIAL AND LATERAL RETINACULUM. THE KNEE WAS STABLE TO 100 OF FLEXION AT THE CLOSE OF THE CASE. THE WOUND WAS IRRIGATED AND THEN CLOSED WITH 2-0 VICRYL  SUBCUTANEOUSLY AND SKIN STAPLES 30 CC OF HALF PERCENT SENSORCAINE WITH EPINEPHRINE INFILTRATED FOR POSTOP ANALGESIA. WOUND WAS CUT CLOSED WITH STAPLES AND THEN DRESSINGS WITH XEROFORM 4 X 4 WEB ROLL AND ACE WRAP FOLLOWED BY A HINGED KNEE BRACE LOCKED IN EXTENSION   CARE: Continues inpatient  PATIENT DISPOSITION:  PACU - hemodynamically stable.

## 2015-05-05 NOTE — Anesthesia Procedure Notes (Signed)
Procedure Name: Intubation Date/Time: 05/05/2015 5:12 PM Performed by: Aline Brochure Pre-anesthesia Checklist: Patient identified, Emergency Drugs available, Suction available and Patient being monitored Patient Re-evaluated:Patient Re-evaluated prior to inductionOxygen Delivery Method: Circle system utilized Preoxygenation: Pre-oxygenation with 100% oxygen Intubation Type: IV induction Ventilation: Mask ventilation without difficulty Laryngoscope Size: Mac and 4 Grade View: Grade II Tube type: Oral Tube size: 7.5 mm Number of attempts: 1 Airway Equipment and Method: Patient positioned with wedge pillow and Bougie stylet Placement Confirmation: positive ETCO2 and breath sounds checked- equal and bilateral Secured at: 22 cm Tube secured with: Tape Dental Injury: Teeth and Oropharynx as per pre-operative assessment  Difficulty Due To: Difficult Airway- due to anterior larynx

## 2015-05-05 NOTE — Care Management Note (Signed)
Case Management Note  Patient Details  Name: [name] MRN: [id number] Date of Birth: 1951/02/09  Subjective/Objective:                  Patient pending surgery today. He would like to return home with his wife Louis Luna. He uses [facility] for Rx. He has no DME and has never needed home health.   Action/Plan:  List of home health agencies left with patient. RNCM to continue to follow PT recommendation for therapy and DME.   Expected Discharge Date:  05/08/15               Expected Discharge Plan:     In-House Referral:     Discharge planning Services  CM Consult  Post Acute Care Choice:  Durable Medical Equipment, Home Health Choice offered to:  Patient  DME Arranged:    DME Agency:     HH Arranged:    [place] Agency:     Status of Service:     Medicare Important Message Given:    Date Medicare IM Given:    Medicare IM give by:    Date Additional Medicare IM Given:    Additional Medicare Important Message give by:     If discussed at [place] of Stay Meetings, dates discussed:    Additional Comments:  Marshell Garfinkel, RN 05/05/2015, 11:30 AM

## 2015-05-05 NOTE — Anesthesia Preprocedure Evaluation (Addendum)
Anesthesia Evaluation  Patient identified by MRN, date of birth, ID band  Reviewed: Allergy & Precautions, NPO status , Patient's Chart, lab work & pertinent test results  Airway Mallampati: II  TM Distance: >3 FB     Dental  (+) Chipped   Pulmonary sleep apnea , former smoker,    Pulmonary exam normal        Cardiovascular hypertension, Pt. on medications + CAD  Normal cardiovascular exam     Neuro/Psych Depression    GI/Hepatic Neg liver ROS, GERD  Medicated and Controlled,  Endo/Other  negative endocrine ROS  Renal/GU negative Renal ROS     Musculoskeletal Quadriceps rupture   Abdominal Normal abdominal exam  (+)   Peds negative pediatric ROS (+)  Hematology negative hematology ROS (+)   Anesthesia Other Findings   Reproductive/Obstetrics                           Anesthesia Physical Anesthesia Plan  ASA: III  Anesthesia Plan: General   Post-op Pain Management:    Induction: Intravenous  Airway Management Planned: Oral ETT  Additional Equipment:   Intra-op Plan:   Post-operative Plan: Extubation in OR  Informed Consent: I have reviewed the patients History and Physical, chart, labs and discussed the procedure including the risks, benefits and alternatives for the proposed anesthesia with the patient or authorized representative who has indicated his/her understanding and acceptance.   Dental advisory given  Plan Discussed with: CRNA and Surgeon  Anesthesia Plan Comments:       Anesthesia Quick Evaluation

## 2015-05-06 ENCOUNTER — Encounter: Payer: Self-pay | Admitting: Orthopedic Surgery

## 2015-05-06 LAB — CBC
HEMATOCRIT: 38.4 % — AB (ref 40.0–52.0)
Hemoglobin: 13.1 g/dL (ref 13.0–18.0)
MCH: 30.2 pg (ref 26.0–34.0)
MCHC: 34 g/dL (ref 32.0–36.0)
MCV: 88.8 fL (ref 80.0–100.0)
PLATELETS: 123 10*3/uL — AB (ref 150–440)
RBC: 4.33 MIL/uL — ABNORMAL LOW (ref 4.40–5.90)
RDW: 17.4 % — AB (ref 11.5–14.5)
WBC: 7.7 10*3/uL (ref 3.8–10.6)

## 2015-05-06 LAB — CREATININE, SERUM
Creatinine, Ser: 0.88 mg/dL (ref 0.61–1.24)
GFR calc Af Amer: >60 mL/min (ref >60)

## 2015-05-06 MED ORDER — METOCLOPRAMIDE HCL 5 MG PO TABS
5.0000 mg | ORAL_TABLET | Freq: Three times a day (TID) | ORAL | Status: DC | PRN
Start: 2015-05-06 — End: 2015-05-08

## 2015-05-06 MED ORDER — ONDANSETRON HCL 4 MG PO TABS: 4.0000 mg | ORAL_TABLET | Freq: Four times a day (QID) | ORAL | Status: DC | PRN

## 2015-05-06 MED ORDER — BISACODYL 10 MG RE SUPP
10.0000 mg | Freq: Every day | RECTAL | Status: DC | PRN
  Administered 2015-05-07: 10 mg via RECTAL
  Filled 2015-05-06 (×2): qty 1

## 2015-05-06 MED ORDER — ASPIRIN EC 81 MG PO TBEC
81.0000 mg | DELAYED_RELEASE_TABLET | Freq: Every day | ORAL | Status: DC
  Administered 2015-05-06 – 2015-05-08 (×3): 81 mg via ORAL
  Filled 2015-05-06 (×3): qty 1

## 2015-05-06 MED ORDER — MAGNESIUM CITRATE PO SOLN
1.0000 | Freq: Once | ORAL | Status: DC | PRN
Start: 2015-05-06 — End: 2015-05-08

## 2015-05-06 MED ORDER — ENOXAPARIN SODIUM 40 MG/0.4ML ~~LOC~~ SOLN
40.0000 mg | SUBCUTANEOUS | Status: DC
  Administered 2015-05-06 – 2015-05-08 (×3): 40 mg via SUBCUTANEOUS
  Filled 2015-05-06 (×3): qty 0.4

## 2015-05-06 MED ORDER — PSYLLIUM 95 % PO PACK
1.0000 | PACK | Freq: Every day | ORAL | Status: DC
  Administered 2015-05-06 – 2015-05-08 (×3): 1 via ORAL
  Filled 2015-05-06 (×3): qty 1

## 2015-05-06 MED ORDER — MAGNESIUM HYDROXIDE 400 MG/5ML PO SUSP: 30.0000 mL | Freq: Every day | ORAL | Status: DC | PRN

## 2015-05-06 MED ORDER — CEFAZOLIN SODIUM-DEXTROSE 2-3 GM-% IV SOLR
2.0000 g | Freq: Four times a day (QID) | INTRAVENOUS | Status: AC
  Administered 2015-05-06 (×3): 2 g via INTRAVENOUS
  Filled 2015-05-06 (×3): qty 50

## 2015-05-06 MED ORDER — OXYCODONE HCL 5 MG PO TABS
5.0000 mg | ORAL_TABLET | ORAL | Status: DC | PRN
  Administered 2015-05-06: 10 mg via ORAL
  Administered 2015-05-06: 5 mg via ORAL
  Administered 2015-05-07: 10 mg via ORAL
  Administered 2015-05-07: 5 mg via ORAL
  Filled 2015-05-06: qty 2
  Filled 2015-05-06 (×2): qty 1
  Filled 2015-05-06: qty 2

## 2015-05-06 MED ORDER — DOCUSATE SODIUM 100 MG PO CAPS
100.0000 mg | ORAL_CAPSULE | Freq: Two times a day (BID) | ORAL | Status: DC
Start: 2015-05-06 — End: 2015-05-08
  Administered 2015-05-06 – 2015-05-08 (×4): 100 mg via ORAL
  Filled 2015-05-06 (×5): qty 1

## 2015-05-06 MED ORDER — ACETAMINOPHEN 325 MG PO TABS: 650.0000 mg | ORAL_TABLET | Freq: Four times a day (QID) | ORAL | Status: DC | PRN

## 2015-05-06 MED ORDER — ONDANSETRON HCL 4 MG/2ML IJ SOLN: 4.0000 mg | Freq: Four times a day (QID) | INTRAMUSCULAR | Status: DC | PRN

## 2015-05-06 MED ORDER — METOCLOPRAMIDE HCL 5 MG/ML IJ SOLN: 5.0000 mg | Freq: Three times a day (TID) | INTRAMUSCULAR | Status: DC | PRN

## 2015-05-06 MED ORDER — DIAZEPAM 5 MG PO TABS
5.0000 mg | ORAL_TABLET | Freq: Every day | ORAL | Status: DC | PRN
  Administered 2015-05-06 – 2015-05-07 (×2): 5 mg via ORAL
  Filled 2015-05-06: qty 1

## 2015-05-06 MED ORDER — ATENOLOL 50 MG PO TABS
50.0000 mg | ORAL_TABLET | Freq: Every day | ORAL | Status: DC
  Administered 2015-05-06 – 2015-05-08 (×3): 50 mg via ORAL
  Filled 2015-05-06: qty 1

## 2015-05-06 MED ORDER — ACETAMINOPHEN 650 MG RE SUPP: 650.0000 mg | Freq: Four times a day (QID) | RECTAL | Status: DC | PRN

## 2015-05-06 MED ORDER — ADULT MULTIVITAMIN W/MINERALS CH
1.0000 | ORAL_TABLET | Freq: Every day | ORAL | Status: DC
  Administered 2015-05-07 – 2015-05-08 (×2): 1 via ORAL
  Filled 2015-05-06 (×2): qty 1

## 2015-05-06 MED ORDER — METHOCARBAMOL 1000 MG/10ML IJ SOLN
500.0000 mg | Freq: Four times a day (QID) | INTRAMUSCULAR | Status: DC | PRN
  Filled 2015-05-06: qty 5

## 2015-05-06 MED ORDER — ZOLPIDEM TARTRATE 5 MG PO TABS: 5.0000 mg | ORAL_TABLET | Freq: Every evening | ORAL | Status: DC | PRN

## 2015-05-06 MED ORDER — HYDROCODONE-ACETAMINOPHEN 5-325 MG PO TABS: 1.0000 | ORAL_TABLET | Freq: Four times a day (QID) | ORAL | Status: DC | PRN

## 2015-05-06 MED ORDER — METHOCARBAMOL 500 MG PO TABS: 500.0000 mg | ORAL_TABLET | Freq: Four times a day (QID) | ORAL | Status: DC | PRN

## 2015-05-06 MED ORDER — SERTRALINE HCL 50 MG PO TABS
50.0000 mg | ORAL_TABLET | Freq: Every day | ORAL | Status: DC
  Administered 2015-05-06 – 2015-05-08 (×3): 50 mg via ORAL
  Filled 2015-05-06: qty 1

## 2015-05-06 MED ORDER — MORPHINE SULFATE (PF) 2 MG/ML IV SOLN
2.0000 mg | INTRAVENOUS | Status: DC | PRN
  Administered 2015-05-06: 2 mg via INTRAVENOUS
  Filled 2015-05-06: qty 1

## 2015-05-06 NOTE — Care Management Note (Signed)
Case Management Note  Patient Details  Name: Louis Luna MRN: [id number] Date of Birth: 26-Apr-1951  Subjective/Objective:     Hospital bed ordered from [facility]              Action/Plan: Home next 24 hours if medically stable  Expected Discharge Date:  05/08/15               Expected Discharge Plan:     In-House Referral:     Discharge planning Services  CM Consult  Post Acute Care Choice:  Durable Medical Equipment, Home Health Choice offered to:  Patient  DME Arranged:    DME Agency:     HH Arranged:    [place] Agency:     Status of Service:     Medicare Important Message Given:    Date Medicare IM Given:    Medicare IM give by:    Date Additional Medicare IM Given:    Additional Medicare Important Message give by:     If discussed at [place] of Stay Meetings, dates discussed:    Additional Comments:  Jolly Mango, RN 05/06/2015, 12:36 PM

## 2015-05-06 NOTE — Anesthesia Postprocedure Evaluation (Signed)
  Anesthesia Post-op Note  Patient: Louis Luna  Procedure(s) Performed: Procedure(s): REPAIR QUADRICEP TENDON (Left)  Anesthesia type:General  Patient location: PACU  Post pain: Pain level controlled  Post assessment: Post-op Vital signs reviewed, Patient's Cardiovascular Status Stable, Respiratory Function Stable, Patent Airway and No signs of Nausea or vomiting  Post vital signs: Reviewed and stable  Last Vitals:  Filed Vitals:   05/06/15 0726  BP: 135/69  Pulse: 65  Temp: 36.8 C  Resp: 18    Level of consciousness: awake, alert  and patient cooperative  Complications: No apparent anesthesia complications

## 2015-05-06 NOTE — Progress Notes (Addendum)
  Subjective: 1 Day Post-Op Procedure(s) (LRB): REPAIR QUADRICEP TENDON (Left) Patient reports pain as moderate.   Patient seen in rounds with Dr. Rudene Christians. Patient is well, and has had no acute complaints or problems Plan is to go Home after hospital stay. Negative for chest pain and shortness of breath Fever: no Gastrointestinal: Negative for nausea and vomiting, now. The patient had nausea in the evening but is improved.  Objective: Vital signs in last 24 hours: Temp:  [97.7 F (36.5 C)-98.5 F (36.9 C)] 98.2 F (36.8 C) (09/09 0610) Pulse Rate:  [49-91] 64 (09/09 0610) Resp:  [12-21] 18 (09/09 0610) BP: (126-180)/(64-112) 130/64 mmHg (09/09 0610) SpO2:  [95 %-100 %] 95 % (09/09 0610)  Intake/Output from previous day:  Intake/Output Summary (Last 24 hours) at 05/06/15 0626 Last data filed at 05/06/15 0559  Gross per 24 hour  Intake 2667.5 ml  Output   1210 ml  Net 1457.5 ml    Intake/Output this shift: Total I/O In: 1967.5 [I.V.:1967.5] Out: 585 [Urine:585]  Labs:  Recent Labs  05/04/15 1608  HGB 14.1    Recent Labs  05/04/15 1608  WBC 7.5  RBC 4.68  HCT 41.7  PLT 123*    Recent Labs  05/04/15 1608  NA 139  K 4.3  CL 105  CO2 26  BUN 20  CREATININE 0.87  GLUCOSE 92  CALCIUM 9.1    Recent Labs  05/04/15 1608  INR 1.37     EXAM General - Patient is Alert and Oriented Extremity - Neurovascular intact Dorsiflexion/Plantar flexion intact Dressing/Incision - clean, dry Motor Function - intact, moving foot and toes well on exam.   History reviewed. No pertinent past medical history.  Assessment/Plan: 1 Day Post-Op Procedure(s) (LRB): REPAIR QUADRICEP TENDON (Left) Active Problems:   Quadriceps muscle rupture  Estimated body mass index is 32.7 kg/(m^2) as calculated from the following:   Height as of this encounter: 5\' 8"  (1.727 m).   Weight as of this encounter: 97.523 kg (215 lb). Up with therapy Plan for discharge tomorrow  DVT  Prophylaxis - Foot Pumps Weight-Bearing as tolerated to left leg  Reche Dixon, PA-C Orthopaedic Surgery 05/06/2015, 6:26 AM   Lovenox in hospital, plan ASA 81 mg daily at home

## 2015-05-06 NOTE — Progress Notes (Signed)
Physical Therapy Treatment Patient Details Name: Louis Luna MRN: [id number] DOB: Mar 23, 1951 Today's Date: 05/06/2015    History of Present Illness Pt is a 64 y.o. male who sustained a L knee avulsion fx of superior pole patella medially after slipping playing golf.  Pt s/p 05/05/15 L quadricep tendon repair.  Per discussion with Dr. Rudene Christians 05/06/15, pt PWB'ing 20-30 pounds L LE with brace locked in extension (NO ROM of knee).  PMH includes R ankle surgery and tumor excision of R neck.    PT Comments    Pt progressing well with functional mobility and able to ambulate 80 feet with RW CGA (chair to follow); pt did well maintaining [place] status this afternoon and reported no increase in pain during session.  Pt also demonstrates improved ability to stand from bed (at lowest height) and from recliner chair with RW this afternoon.  Anticipate with continued progress and ability to navigate stairs into home, pt will be able to discharge home with support of family.   Follow Up Recommendations  Home health PT;Supervision for mobility/OOB (pending pt's progress)     Equipment Recommendations  Rolling walker with 5" wheels;3in1 (PT)    Recommendations for Other Services       Precautions / Restrictions Precautions Precautions: Fall Required Braces or Orthoses: Knee Immobilizer - Left;Other Brace/Splint Knee Immobilizer - Left: On at all times Other Brace/Splint: L LE brace locked in extension at all times Restrictions Weight Bearing Restrictions: Yes LLE Weight Bearing: Partial weight bearing LLE Partial Weight Bearing Percentage or Pounds: 20-30 pounds Other Position/Activity Restrictions: No L knee ROM    Mobility  Bed Mobility Overal bed mobility: Needs Assistance Bed Mobility: Sit to Supine       Sit to supine: Supervision   General bed mobility comments: pt used his R LE to assist L LE into bed  Transfers Overall transfer level: Needs assistance Equipment used: Rolling  walker (2 wheeled) Transfers: Sit to/from [employer] to Stand: Min assist (from recliner chair and from bed at lowest height) Stand pivot transfers: Min guard (recliner to bed)       General transfer comment: intermittent vc's required for hand and feet positioning and for maintaining WB'ing precautions  Ambulation/Gait Ambulation/Gait assistance: Min guard;+2 safety/equipment (2nd assist for chair follow) Ambulation Distance (Feet): 80 Feet Assistive device: Rolling walker (2 wheeled)   Gait velocity: decreased   General Gait Details: pt required initial vc's and demo for PWB (20-30 pound) status; pt did well maintaining WB'ing (pt toe touch for balance when advancing RW and then [place] on L LE when advancing R LE)   Stairs            Wheelchair Mobility    Modified Rankin (Stroke Patients Only)       Balance                                    Cognition Arousal/Alertness: Awake/alert Behavior During Therapy: WFL for tasks assessed/performed Overall Cognitive Status: Within Functional Limits for tasks assessed                      Exercises      General Comments  Pt agreeable to PT session.      Pertinent Vitals/Pain Pain Assessment: 0-10 Pain Score: 6  Pain Location: L knee Pain Descriptors / Indicators: Sore;Tender Pain Intervention(s): Limited activity within patient's tolerance;Monitored during  session;Repositioned;Patient requesting pain meds-RN notified  Vitals stable and WFL throughout treatment session.    Home Living                      Prior Function            PT Goals (current goals can now be found in the care plan section) Acute Rehab PT Goals Patient Stated Goal: To be able to walk again PT Goal Formulation: With patient Time For Goal Achievement: 05/20/15 Potential to Achieve Goals: Good Progress towards PT goals: Progressing toward goals    Frequency  BID    PT Plan  Current plan remains appropriate    Co-evaluation             End of Session Equipment Utilized During Treatment: Gait belt;Left knee immobilizer Activity Tolerance: Patient tolerated treatment well;No increased pain Patient left: in bed;with call bell/phone within reach;with bed alarm set;with SCD's reapplied (L heel elevated via towel roll)     Time: 1610-9604 PT Time Calculation (min) (ACUTE ONLY): 38 min  Charges:  $Gait Training: 23-37 mins $Therapeutic Activity: 8-22 mins                    G CodesLeitha Bleak 2015-05-09, 5:40 PM Leitha Bleak, [place]

## 2015-05-06 NOTE — Evaluation (Signed)
Physical Therapy Evaluation Patient Details Name: Louis Luna MRN: [id number] DOB: 08/25/1951 Today's Date: 05/06/2015   History of Present Illness  Pt is a 64 y.o. male who sustained a L knee avulsion fx of superior pole patella medially after slipping playing golf.  Pt s/p 05/05/15 L quadricep tendon repair.  Per discussion with Dr. Rudene Christians 05/06/15, pt PWB'ing 20-30 pounds L LE with brace locked in extension (NO ROM of knee).  PMH includes R ankle surgery and tumor excision of R neck.  Clinical Impression  Currently pt demonstrates impairments with strength, balance, and limitations with functional mobility.  Prior to admission, pt was independent without AD.  Pt lives with his wife in 2 story home but is getting hospital bed delivered and plans to stay on main floor of home (has 4-5 steps to enter).  Currently pt required 2 assist to stand with bed height elevated (pt c/o R thigh cramping complicating standing) but after initial vc's for [place] status pt able to ambulate with 1 assist and RW short distance in room (SBA of 2nd for safety).  Pt would benefit from skilled PT to address above noted impairments and functional limitations.  Recommend pt discharge to home with HHPT (pending pt's progress) and assist for stairs when medically appropriate.     Follow Up Recommendations Home health PT;Supervision for mobility/OOB (pending pt's progress)    Equipment Recommendations  Rolling walker with 5" wheels;3in1 (PT)    Recommendations for Other Services       Precautions / Restrictions Precautions Precautions: Fall Required Braces or Orthoses: Other Brace/Splint Other Brace/Splint: L LE brace locked in extension at all times Restrictions Weight Bearing Restrictions: Yes LLE Weight Bearing: Partial weight bearing LLE Partial Weight Bearing Percentage or Pounds: 20-30 pounds Other Position/Activity Restrictions: No L knee ROM      Mobility  Bed Mobility Overal bed mobility: Needs  Assistance Bed Mobility: Supine to Sit     Supine to sit: Min assist;HOB elevated     General bed mobility comments: assist for L LE  Transfers Overall transfer level: Needs assistance Equipment used: Rolling walker (2 wheeled) Transfers: Sit to/from Stand Sit to Stand: Mod assist;+2 physical assistance;From elevated surface (2 trials to stand (pt unable to stand with bed at lowest position so bed height elevated))         General transfer comment: vc's required for hand and feet positioning and for maintaining WB'ing precautions  Ambulation/Gait Ambulation/Gait assistance: Min assist;+2 safety/equipment Ambulation Distance (Feet): 12 Feet Assistive device: Rolling walker (2 wheeled)       General Gait Details: pt required initial vc's for PWB (20-30 pound) status but then improved with practice/repetition  Stairs            Wheelchair Mobility    Modified Rankin (Stroke Patients Only)       Balance Overall balance assessment: Needs assistance Sitting-balance support: Feet supported Sitting balance-Leahy Scale: Good     Standing balance support: Bilateral upper extremity supported (on RW) Standing balance-Leahy Scale: Good                               Pertinent Vitals/Pain Pain Assessment: 0-10 Pain Score: 6  Pain Location: L knee Pain Descriptors / Indicators: Sore;Tender Pain Intervention(s): Limited activity within patient's tolerance;Monitored during session;Premedicated before session  See flowsheet for HR and O2 details.    Home Living Family/patient expects to be discharged to:: Private residence Living Arrangements:  Spouse/significant other Available Help at Discharge: Family Type of Home: House Home Access: Stairs to enter   [employer] of Steps: 4-5 steps to enter (front entry has B rails but can't reach both at same time; back entry has no railing) Home Layout: Two level;Able to live on main level with  bedroom/bathroom (pt has hospital bed planned for delivery today) Home Equipment: Hospital bed      Prior Function Level of Independence: Independent               Hand Dominance        Extremity/Trunk Assessment   Upper Extremity Assessment: Overall WFL for tasks assessed           Lower Extremity Assessment: RLE deficits/detail;LLE deficits/detail RLE Deficits / Details: WFL LLE Deficits / Details: good DF/PF AROM  Cervical / Trunk Assessment: Normal  Communication   Communication: No difficulties  Cognition Arousal/Alertness: Awake/alert Behavior During Therapy: WFL for tasks assessed/performed Overall Cognitive Status: Within Functional Limits for tasks assessed                      General Comments General comments (skin integrity, edema, etc.): bandages in place L LE  Nursing cleared pt for participation in physical therapy.  Pt agreeable to PT session. Pt's wife present during PT session. [place] discussed with MD Rudene Christians prior to PT session and MD placed [name] recommendations in PT order after session completed.    Exercises        Assessment/Plan    PT Assessment Patient needs continued PT services  PT Diagnosis Difficulty walking   PT Problem List Decreased strength;Decreased activity tolerance;Decreased balance;Decreased mobility;Decreased knowledge of use of DME;Decreased knowledge of precautions;Pain  PT Treatment Interventions DME instruction;Gait training;Stair training;Functional mobility training;Therapeutic activities;Therapeutic exercise;Balance training;Patient/family education   PT Goals (Current goals can be found in the Care Plan section) Acute Rehab PT Goals Patient Stated Goal: To be able to walk again PT Goal Formulation: With patient Time For Goal Achievement: 05/20/15 Potential to Achieve Goals: Good    Frequency BID   Barriers to discharge Inaccessible home environment Stairs    Co-evaluation                End of Session Equipment Utilized During Treatment: Gait belt;Left knee immobilizer Activity Tolerance: Patient tolerated treatment well Patient left: in chair;with call bell/phone within reach;with chair alarm set;with family/visitor present Nurse Communication: Mobility status;Precautions;Weight bearing status         Time: 7782-4235 PT Time Calculation (min) (ACUTE ONLY): 37 min   Charges:   PT Evaluation $Initial PT Evaluation Tier I: 1 Procedure PT Treatments $Therapeutic Activity: 8-22 mins   PT G CodesLeitha Luna 05-25-15, 1:29 PM Louis Luna, [place]

## 2015-05-06 NOTE — Progress Notes (Signed)
Pt. Alert and oriented. VSS. Pain controlled with meds per MAR. Neurochecks WDL. Pt. Dangled at the bedside. Tolerating PO's. Ice pack on incision. Resting quietly. Will continue to monitor.

## 2015-05-06 NOTE — Progress Notes (Signed)
Patient walking in hallway with PT - using walker.

## 2015-05-07 MED ORDER — HYDROCODONE-ACETAMINOPHEN 5-325 MG PO TABS
1.0000 | ORAL_TABLET | ORAL | Status: DC | PRN
  Administered 2015-05-07 (×2): 2 via ORAL
  Administered 2015-05-07 – 2015-05-08 (×2): 1 via ORAL
  Filled 2015-05-07: qty 1
  Filled 2015-05-07: qty 2
  Filled 2015-05-07: qty 1
  Filled 2015-05-07: qty 2

## 2015-05-07 MED ORDER — LACTULOSE 10 GM/15ML PO SOLN
10.0000 g | Freq: Two times a day (BID) | ORAL | Status: DC | PRN
  Administered 2015-05-07: 10 g via ORAL
  Filled 2015-05-07: qty 30

## 2015-05-07 MED ORDER — HYDROCODONE-ACETAMINOPHEN 5-325 MG PO TABS: 1.0000 | ORAL_TABLET | ORAL | Status: DC | PRN

## 2015-05-07 NOTE — Progress Notes (Signed)
Dr.Hooten here to see patient.  

## 2015-05-07 NOTE — Progress Notes (Signed)
   Subjective: 2 Days Post-Op Procedure(s) (LRB): REPAIR QUADRICEP TENDON (Left) Patient reports pain as mild.   Patient is well, and has had no acute complaints or problems Continue with physical therapy today.  Plan is to go Home after hospital stay provided he does well today. no nausea and no vomiting Patient denies any chest pains or shortness of breath. Objective: Vital signs in last 24 hours: Temp:  [98.1 F (36.7 C)-98.5 F (36.9 C)] 98.1 F (36.7 C) (09/10 0413) Pulse Rate:  [68-79] 71 (09/10 0413) Resp:  [18-20] 18 (09/10 0413) BP: (121-131)/(61-80) 131/61 mmHg (09/10 0413) SpO2:  [96 %-97 %] 96 % (09/10 0413) well approximated incision Heels are non tender and elevated off the bed using rolled towels Intake/Output from previous day: 09/09 0701 - 09/10 0700 In: 240 [P.O.:240] Out: 1910 [Urine:1910] Intake/Output this shift:     Recent Labs  05/04/15 1608 05/06/15 1002  HGB 14.1 13.1    Recent Labs  05/04/15 1608 05/06/15 1002  WBC 7.5 7.7  RBC 4.68 4.33*  HCT 41.7 38.4*  PLT 123* 123*    Recent Labs  05/04/15 1608 05/06/15 1002  NA 139  --   K 4.3  --   CL 105  --   CO2 26  --   BUN 20  --   CREATININE 0.87 0.88  GLUCOSE 92  --   CALCIUM 9.1  --     Recent Labs  05/04/15 1608  INR 1.37    EXAM General - Patient is Alert, Appropriate and Oriented Extremity - Neurologically intact Neurovascular intact Sensation intact distally Intact pulses distally Dorsiflexion/Plantar flexion intact Dressing - dressing C/D/I Motor Function - intact, moving foot and toes well on exam. Swelling of left lower extremity decreased but still has some swelling to the thigh region.  History reviewed. No pertinent past medical history.  Assessment/Plan: 2 Days Post-Op Procedure(s) (LRB): REPAIR QUADRICEP TENDON (Left) Active Problems:   Quadriceps muscle rupture  Estimated body mass index is 32.7 kg/(m^2) as calculated from the following:   Height  as of this encounter: 5\' 8"  (1.727 m).   Weight as of this encounter: 97.523 kg (215 lb). Up with therapy Plan for discharge tomorrow Discharge home with home health  Labs: Reviewed DVT Prophylaxis - Lovenox and TED hose Partial weightbearing left leg. 20-30 pounds  Knee immobilizer to be on at all times in extension. Range of motion at this time Patient will be discharged to home on aspirin and no Lovenox Patient does have a bed also at rehabilitation but we'll keep him one extra day in the hospital so that he go home tomorrow. Patient has not had a bowel movement as of yet.    Jillyn Ledger. [employer] [place] 05/07/2015, 7:41 AM

## 2015-05-07 NOTE — Progress Notes (Signed)
Physical Therapy Treatment Patient Details Name: Louis Luna MRN: [id number] DOB: [birth year] Today's Date: 05/07/2015    History of Present Illness Pt is a 64 y.o. male who sustained a L knee avulsion fx of superior pole patella medially after slipping playing golf.  Pt s/p 05/05/15 L quadricep tendon repair.  Per discussion with Dr. Rudene Christians 05/06/15, pt PWB'ing 20-30 pounds L LE with brace locked in extension (NO ROM of knee).  PMH includes R ankle surgery and tumor excision of R neck.    PT Comments    Patient responded well to cues for better positioning during bed mobility and transfers. He is able to transition sit to supine with supervision using bed rails. Patient requires CGA for sit<>Stand transfers demonstrating better safety awareness requiring less cues for advancing LLE forward. Patient was instructed in stair negotiation this afternoon utilizing railing for safety. Patient required min-mod  A for stair negotiation but was able to verbalize understanding of correct technique. He would benefit from additional skilled PT intervention to improve balance/gait safety including stair negotiation ability for safe entry/exit into home.  Follow Up Recommendations  Home health PT;Supervision for mobility/OOB (pending pt's progress)     Equipment Recommendations  Rolling walker with 5" wheels;3in1 (PT)    Recommendations for Other Services       Precautions / Restrictions Precautions Precautions: Fall Required Braces or Orthoses: Knee Immobilizer - Left;Other Brace/Splint Knee Immobilizer - Left: On at all times Other Brace/Splint: L LE brace locked in extension at all times Restrictions Weight Bearing Restrictions: Yes LLE Weight Bearing: Partial weight bearing LLE Partial Weight Bearing Percentage or Pounds: 20-30 pounds Other Position/Activity Restrictions: No L knee ROM    Mobility  Bed Mobility Overal bed mobility: Needs Assistance Bed Mobility: Sit to Supine;Supine to  Sit     Supine to sit: HOB elevated;Min assist Sit to supine: Supervision;HOB elevated   General bed mobility comments: Patient able to initiate LLE movement better, but requires min A for holding LLE into hip flexion as patient scoots edge of bed to avoid knee flexion; Patient required min Vcs for hand placement; Able to transition sit to supine with supervision, lifting LLE independently;  Transfers Overall transfer level: Needs assistance Equipment used: Rolling walker (2 wheeled) Transfers: Sit to/from Stand Sit to Stand: Min guard         General transfer comment: Transferred sit<>Stand from bed/chair, with RW, CGA with min Vcs for hand placement and to advance LLE forward prior to sitting for maintaining weight bearing precautions.  Ambulation/Gait Ambulation/Gait assistance: Min assist Ambulation Distance (Feet): 100 Feet Assistive device: Rolling walker (2 wheeled)   Gait velocity: decreased   General Gait Details: Patient required mod VCs for weight bearing precautions of 20-30 pounds. He tends to ambulate without LLE weight bearing, and will swing through pushing through BUE; Patient fatigues quickly in BUE and required 1 standing rest breaks where he was instructed in LLE toe touch weight bearing;    Stairs Stairs: Yes Stairs assistance: Mod assist Stair Management: One rail Left Number of Stairs: 4 General stair comments: ascend/descend 4 steps with BUE on left rail, one step at a time with BUE pushing through rail x1 rep with cues for foot placement and hand placement  Wheelchair Mobility    Modified Rankin (Stroke Patients Only)       Balance  Cognition Arousal/Alertness: Awake/alert Behavior During Therapy: WFL for tasks assessed/performed Overall Cognitive Status: Within Functional Limits for tasks assessed                      Exercises General Exercises - Lower Extremity Ankle  Circles/Pumps: AROM;Both;15 reps;Seated Gluteal Sets: AROM;Both;5 reps;Seated Long Arc Quad: AROM;Right;15 reps;Seated Hip ABduction/ADduction: AROM;Left;5 reps;Supine Hip Flexion/Marching: AROM;Right;15 reps;Seated Other Exercises Other Exercises: Patient required min Vcs for correct exercise technique including to increase ROM on RLE for better strengthening;He denies any increase in LLE pain with exercise. Other Exercises: bridges with RLE only, supine x10 reps with cues for glut squeeze at end of bridge;    General Comments        Pertinent Vitals/Pain Pain Assessment: 0-10 Pain Score: 4  Pain Location: left knee Pain Descriptors / Indicators: Sore Pain Intervention(s): Limited activity within patient's tolerance;Monitored during session;Repositioned    Home Living                      Prior Function            PT Goals (current goals can now be found in the care plan section) Acute Rehab PT Goals Patient Stated Goal: To be able to walk again PT Goal Formulation: With patient Time For Goal Achievement: 05/20/15 Potential to Achieve Goals: Good Progress towards PT goals: Progressing toward goals    Frequency  BID    PT Plan Current plan remains appropriate    Co-evaluation             End of Session Equipment Utilized During Treatment: Gait belt;Left knee immobilizer Activity Tolerance: Patient tolerated treatment well;No increased pain Patient left: with call bell/phone within reach;in chair;with chair alarm set (L heel elevated via towel roll)     Time: 1310-1355 PT Time Calculation (min) (ACUTE ONLY): 45 min  Charges:  $Gait Training: 8-22 mins $Therapeutic Exercise: 8-22 mins $Therapeutic Activity: 8-22 mins                    G Codes:      Hopkins,Alexys Gassett PT, DPT 05/07/2015, 2:06 PM

## 2015-05-07 NOTE — Progress Notes (Signed)
Physical Therapy Treatment Patient Details Name: Louis Luna MRN: [id number] DOB: 07-31-1951 Today's Date: 05/07/2015    History of Present Illness Pt is a 64 y.o. male who sustained a L knee avulsion fx of superior pole patella medially after slipping playing golf.  Pt s/p 05/05/15 L quadricep tendon repair.  Per discussion with Dr. Rudene Christians 05/06/15, pt PWB'ing 20-30 pounds L LE with brace locked in extension (NO ROM of knee).  PMH includes R ankle surgery and tumor excision of R neck.    PT Comments    Patient instructed in BLE strengthening/ROM exercise. PT avoided LLE knee ROM, but instead focused on hip for better strengthening. Patient demonstrate better ability to initiate LLE movement with bed mobility. He did have difficulty with walking, tending to avoid LLE weight bearing and demonstrating a swing to gait pattern with RW. Patient requires min A with gait, ambulating approximately 100 feet with 2 standing rest breaks due to fatigue. He would benefit from additional skilled PT intervention to improve balance/gait safety.  Follow Up Recommendations  Home health PT;Supervision for mobility/OOB (pending pt's progress)     Equipment Recommendations  Rolling walker with 5" wheels;3in1 (PT)    Recommendations for Other Services       Precautions / Restrictions Precautions Precautions: Fall Required Braces or Orthoses: Knee Immobilizer - Left;Other Brace/Splint Knee Immobilizer - Left: On at all times Other Brace/Splint: L LE brace locked in extension at all times Restrictions Weight Bearing Restrictions: Yes LLE Weight Bearing: Partial weight bearing LLE Partial Weight Bearing Percentage or Pounds: 20-30 pounds Other Position/Activity Restrictions: No L knee ROM    Mobility  Bed Mobility Overal bed mobility: Needs Assistance Bed Mobility: Supine to Sit     Supine to sit: Min assist;HOB elevated     General bed mobility comments: Patient able to initiate LLE movement  better, but requires min A for holding LLE into hip flexion as patient scoots edge of bed to avoid knee flexion; Patient required min Vcs for hand placement;  Transfers Overall transfer level: Needs assistance Equipment used: Rolling walker (2 wheeled)   Sit to Stand: Min assist         General transfer comment: Transferred sit<>Stand from bed/chair, with RW, min A with min Vcs for hand placement and to advance LLE forward prior to sitting for maintaining weight bearing precautions.  Ambulation/Gait Ambulation/Gait assistance: Min assist Ambulation Distance (Feet): 100 Feet Assistive device: Rolling walker (2 wheeled)   Gait velocity: decreased   General Gait Details: Patient required mod VCs for weight bearing precautions of 20-30 points. He tends to ambulate without LLE weight bearing, and will swing through pushing through BUE; Patient fatigues quickly in BUE and required 2 standing rest breaks where he was instructed in LLE toe touch weight bearing;    Stairs            Wheelchair Mobility    Modified Rankin (Stroke Patients Only)       Balance                                    Cognition Arousal/Alertness: Awake/alert Behavior During Therapy: WFL for tasks assessed/performed Overall Cognitive Status: Within Functional Limits for tasks assessed                      Exercises General Exercises - Lower Extremity Ankle Circles/Pumps: AROM;Both;15 reps Gluteal Sets: AROM;Both;5 reps;Seated Straight  Leg Raises: AROM;AAROM;Right;Left;Supine;15 reps (AAROM on LLE) Other Exercises Other Exercises: Patient required min Vcs for correct exercise technique including to increase ROM on RLE for better strengthening; He fatigues with SLR flexion on LLE but was able to participate better without knee pain/discomfort.    General Comments        Pertinent Vitals/Pain Pain Assessment: 0-10 Pain Score: 5  Pain Location: left knee Pain Descriptors /  Indicators: Sore Pain Intervention(s): Limited activity within patient's tolerance;Monitored during session;Repositioned    Home Living                      Prior Function            PT Goals (current goals can now be found in the care plan section) Acute Rehab PT Goals Patient Stated Goal: To be able to walk again PT Goal Formulation: With patient Time For Goal Achievement: 05/20/15 Potential to Achieve Goals: Good Progress towards PT goals: Progressing toward goals    Frequency  BID    PT Plan Current plan remains appropriate    Co-evaluation             End of Session Equipment Utilized During Treatment: Gait belt;Left knee immobilizer Activity Tolerance: Patient tolerated treatment well;No increased pain Patient left: with call bell/phone within reach;in chair;with chair alarm set (L heel elevated via towel roll)     Time: 9021-1155 PT Time Calculation (min) (ACUTE ONLY): 40 min  Charges:  $Gait Training: 8-22 mins $Therapeutic Exercise: 8-22 mins $Therapeutic Activity: 8-22 mins                    G Codes:      Hopkins,Margaret PT, DPT 05/07/2015, 12:41 PM

## 2015-05-07 NOTE — Progress Notes (Signed)
Pt. Alert and oriented. VSS. Pain controlled with PO pain meds. POD 2. Brace to left leg with ice pack on top. Using urinal. Tolerating PO's. Neurochecks WDL. Resting quietly.

## 2015-05-07 NOTE — Discharge Instructions (Signed)
Patient instructions  Patient will take one 81 mg enteric-coated aspirin per day. Keep the wound clean and dry. Do not take a shower or get the wound wet until the staples are removed at 2 weeks. Continue knee immobilizer at all times except for washing the skin. No range of motion to be performed on the left knee until notified by Dr. Rudene Christians. Regular diet Continue Norco 1-2 tablets every 4-6 hours when necessary pain. May take tramadol in between doses of Norco if needed. Continue Polar Care to left leg Patient will need follow-up with [facility] orthopedics the first of next week for wound check.

## 2015-05-08 MED ORDER — HYDROCODONE-ACETAMINOPHEN 5-325 MG PO TABS: 1.0000 | ORAL_TABLET | ORAL | Status: DC | PRN

## 2015-05-08 NOTE — Progress Notes (Signed)
Discussed discharge orders and instructions with pt and wife.  No questions at this time.  Rx given and IV removed.  Pt transported via car by wife and daughter.  Clarise Cruz, RN

## 2015-05-08 NOTE — Care Management Note (Signed)
Case Management Note  Patient Details  Name: Louis Luna MRN: [id number] Date of Birth: 16-Oct-1950  Subjective/Objective:      [name] did not want to wait to have his rolling walker delivered to [facility] today and he was provided with a loaner front wheel rolling walker by the nursing staff. Chastity at [facility] was advised and [facility] will mail a walker to [name] home address which should arrive within one to two days.               Action/Plan:   Expected Discharge Date:  05/08/15               Expected Discharge Plan:     In-House Referral:     Discharge planning Services  CM Consult  Post Acute Care Choice:  Durable Medical Equipment, Home Health Choice offered to:  Patient  DME Arranged:    DME Agency:     HH Arranged:    [place] Agency:     Status of Service:     Medicare Important Message Given:    Date Medicare IM Given:    Medicare IM give by:    Date Additional Medicare IM Given:    Additional Medicare Important Message give by:     If discussed at [place] of Stay Meetings, dates discussed:    Additional Comments:  Trinadee Verhagen A, RN 05/08/2015, 2:46 PM

## 2015-05-08 NOTE — Progress Notes (Signed)
Physical Therapy Treatment Patient Details Name: Louis Luna MRN: [id number] DOB: 06-13-1951 Today's Date: 05/08/2015    History of Present Illness Pt is a 64 y.o. male who sustained a L knee avulsion fx of superior pole patella medially after slipping playing golf.  Pt s/p 05/05/15 L quadricep tendon repair.  Per discussion with Dr. Rudene Christians 05/06/15, pt PWB'ing 20-30 pounds L LE with brace locked in extension (NO ROM of knee).  PMH includes R ankle surgery and tumor excision of R neck.    PT Comments    Patient progressing well. He is able to perform increased BLE AROM/strengthening exercise with less difficulty. Patient is supervision for bed mobility and mostly supervision-CGA for transfers with RW. Patient ambulates with close supervision demonstrating good mechanics, maintaining weight bearing restrictions. Patient was instructed in stair negotiation today and was min guard when going up backwards with RW. He demonstrates better safety awareness as compared to yesterday session. Patient would benefit from additional skilled PT Intervention to improve LE strength, balance/gait safety.  Follow Up Recommendations  Home health PT;Supervision for mobility/OOB (pending pt's progress)     Equipment Recommendations  Rolling walker with 5" wheels;3in1 (PT)    Recommendations for Other Services       Precautions / Restrictions Precautions Precautions: Fall Required Braces or Orthoses: Knee Immobilizer - Left;Other Brace/Splint Knee Immobilizer - Left: On at all times Other Brace/Splint: L LE brace locked in extension at all times Restrictions Weight Bearing Restrictions: Yes LLE Weight Bearing: Partial weight bearing LLE Partial Weight Bearing Percentage or Pounds: 20-30 pounds Other Position/Activity Restrictions: No L knee ROM    Mobility  Bed Mobility Overal bed mobility: Needs Assistance Bed Mobility: Supine to Sit     Supine to sit: Supervision     General bed mobility  comments: Patient able to use bed rails and move LLE independently with transitioning supine to sit. He does require supervision for safety when sitting edge of bed; patient required min VCs for correct exercise technique.  Transfers Overall transfer level: Needs assistance Equipment used: Rolling walker (2 wheeled) Transfers: Sit to/from Stand Sit to Stand: Min guard         General transfer comment: Patient transferred sit<>stand from bed/chair, CGA with RW. He is able to demonstrate good safety awareness with good hand placement. He did require modA +2 for transferring from sitting on steps x1 rep with B rail assist as patient had difficulty pushing through RLE from low surface.  Ambulation/Gait Ambulation/Gait assistance: Supervision Ambulation Distance (Feet): 75 Feet Assistive device: Rolling walker (2 wheeled)   Gait velocity: decreased   General Gait Details:  He tends to ambulate without LLE weight bearing, and will swing through pushing through BUE; Patient fatigues quickly in BUE and required 1 standing rest breaks where he was instructed in LLE toe touch weight bearing; Patient able to maintain LLE weight bearing precautions well.   Stairs Stairs: Yes Stairs assistance: Min guard Stair Management: One rail Left;Backwards;With walker Number of Stairs: 8 General stair comments: Pt ascend/descend 4 steps x2 reps; Initially patient attempted sideways with using left rail, however he had increased difficulty pushing through BUE; PT instructed patient in backwards with RW for assistance. Patient able to demonstrate good safety awareness and maintained weight bearing restrictions when performing backwards.  Wheelchair Mobility    Modified Rankin (Stroke Patients Only)       Balance  Cognition Arousal/Alertness: Awake/alert Behavior During Therapy: WFL for tasks assessed/performed Overall Cognitive Status: Within  Functional Limits for tasks assessed                      Exercises General Exercises - Lower Extremity Ankle Circles/Pumps: AROM;Both;15 reps;Supine Heel Slides: AROM;Right;15 reps;Supine Hip ABduction/ADduction: AROM;Both;15 reps;Supine Straight Leg Raises: AROM;Strengthening;15 reps;Supine;Right Other Exercises Other Exercises: Patient required min Vcs for correct exercise technique including to increase ROM on RLE for better strengthening;He denies any increase in LLE pain with exercise. Other Exercises: bridges with RLE only, supine x15 reps with cues for glut squeeze at end of bridge;    General Comments        Pertinent Vitals/Pain Pain Assessment: 0-10 Pain Score: 5  Pain Location: left knee Pain Descriptors / Indicators: Cramping Pain Intervention(s): Limited activity within patient's tolerance;Monitored during session;Repositioned    Home Living                      Prior Function            PT Goals (current goals can now be found in the care plan section) Acute Rehab PT Goals Patient Stated Goal: To be able to walk again PT Goal Formulation: With patient Time For Goal Achievement: 05/20/15 Potential to Achieve Goals: Good Progress towards PT goals: Progressing toward goals    Frequency  BID    PT Plan Current plan remains appropriate    Co-evaluation             End of Session Equipment Utilized During Treatment: Gait belt;Left knee immobilizer Activity Tolerance: Patient tolerated treatment well;No increased pain Patient left: with call bell/phone within reach;in chair;with chair alarm set (L heel elevated via towel roll)     Time: 7591-6384 PT Time Calculation (min) (ACUTE ONLY): 42 min  Charges:  $Gait Training: 23-37 mins $Therapeutic Exercise: 8-22 mins                    G Codes:      Hopkins,Ethelene Closser PT, DPT 05/08/2015, 9:48 AM

## 2015-05-08 NOTE — Care Management Note (Signed)
Case Management Note  Patient Details  Name: Louis Luna MRN: [id number] Date of Birth: 1950-09-06  Subjective/Objective:      Referral faxed to [place] requesting home health PT.               Action/Plan:   Expected Discharge Date:  05/08/15               Expected Discharge Plan:     In-House Referral:     Discharge planning Services  CM Consult  Post Acute Care Choice:  Durable Medical Equipment, Home Health Choice offered to:  Patient  DME Arranged:    DME Agency:     HH Arranged:    [place] Agency:     Status of Service:     Medicare Important Message Given:    Date Medicare IM Given:    Medicare IM give by:    Date Additional Medicare IM Given:    Additional Medicare Important Message give by:     If discussed at [place] of Stay Meetings, dates discussed:    Additional Comments:  Cordero Surette A, RN 05/08/2015, 1:10 PM

## 2015-05-08 NOTE — Discharge Summary (Signed)
Physician Discharge Summary  Patient ID: Louis Luna MRN: [id number] DOB/AGE: 65-10-1950 65 y.o.  Admit date: 05/04/2015 Discharge date: 05/08/2015  Admission Diagnoses:  Hypertension [I10] Avulsion fracture [T14.8]   Discharge Diagnoses: Patient Active Problem List   Diagnosis Date Noted  . Quadriceps muscle rupture 05/04/2015  . Atherosclerosis of coronary artery 01/27/2015  . Clinical depression 01/27/2015  . Acid reflux 01/27/2015  . History of tobacco use 01/27/2015  . HLD (hyperlipidemia) 01/27/2015  . BP (high blood pressure) 01/27/2015  . Fatty tumor 01/27/2015  . Obstructive sleep apnea 01/27/2015    History reviewed. No pertinent past medical history.   Transfusion: No transfusions given doing this admission   Consultants (if any):   no consultations on this admission  Discharged Condition: Improved  Hospital Course: [name] is an 64 y.o. male who was admitted 05/04/2015 with a diagnosis of rupture left quadricep mechanism and went to the operating room on 05/04/2015 - 05/05/2015 and underwent the above named procedures.    Surgeries:Procedure(s):  REPAIR QUADRICEP TENDON on 05/04/2015 - 05/05/2015  ANESTHESIA: spinal  EBL: Total I/O In: 700 [I.V.:700] Out: 625 [Urine:600; Blood:25]  BLOOD ADMINISTERED:none  DRAINS: none   LOCAL MEDICATIONS USED: MARCAINE   SPECIMEN: No Specimen  DISPOSITION OF SPECIMEN: N/A  COUNTS: YES  TOURNIQUET: 47 minutes at 300 mmHg  IMPLANTS: Arthritis bio composite swivel lock suture anchor 2 Patient tolerated the surgery well. No complications .Patient was taken to PACU where she was stabilized and then transferred to the orthopedic floor.  Patient started on Lovenox 30 q 12 hrs. Foot pumps applied bilaterally at 80 mm hg. Heels elevated off bed with rolled towels. No evidence of DVT. Calves non tender. Negative Homan. Physical therapy started on day #1 for gait training and transfer with OT starting on   day #1 for ADL and assisted devices. Patient has done well with therapy. Ambulated 100 feet upon being discharged. Was able to go up 4 steps independently and safely.  Patient's IV , foley discontinued on day #1    He was given perioperative antibiotics:  Anti-infectives    Start     Dose/Rate Route Frequency Ordered Stop   05/06/15 0902  ceFAZolin (ANCEF) IVPB 2 g/50 mL premix     2 g 100 mL/hr over 30 Minutes Intravenous Every 6 hours 05/06/15 0902 05/06/15 2230   05/05/15 0600  ceFAZolin (ANCEF) IVPB 2 g/50 mL premix  Status:  Discontinued     2 g 100 mL/hr over 30 Minutes Intravenous On call to O.R. 05/04/15 1534 05/06/15 0902    .  He was given sequential compression devices, early ambulation, and TED stockings and Lovenox for DVT prophylaxis.  He benefited maximally from the hospital stay and there were no complications.    Recent vital signs:  Filed Vitals:   05/08/15 0408  BP: 129/68  Pulse: 67  Temp: 98.7 F (37.1 C)  Resp: 18    Recent laboratory studies:  Lab Results  Component Value Date   HGB 13.1 05/06/2015   HGB 14.1 05/04/2015   HGB 15.8 03/11/2013   Lab Results  Component Value Date   WBC 7.7 05/06/2015   PLT 123* 05/06/2015   Lab Results  Component Value Date   INR 1.37 05/04/2015   Lab Results  Component Value Date   NA 139 05/04/2015   K 4.3 05/04/2015   CL 105 05/04/2015   CO2 26 05/04/2015   BUN 20 05/04/2015   CREATININE 0.88 05/06/2015  GLUCOSE 92 05/04/2015    Discharge Medications:     Medication List    TAKE these medications        aspirin EC 81 MG tablet  Take 81 mg by mouth daily.     atenolol 50 MG tablet  Commonly known as:  TENORMIN  Take 50 mg by mouth daily.     diazepam 5 MG tablet  Commonly known as:  VALIUM  Take 5 mg by mouth daily as needed for anxiety.     HYDROcodone-acetaminophen 5-325 MG per tablet  Commonly known as:  NORCO/VICODIN  Take 1-2 tablets by mouth every 4 (four) hours as needed for  moderate pain.     HYDROcodone-acetaminophen 5-325 MG per tablet  Commonly known as:  NORCO/VICODIN  Take 1-2 tablets by mouth every 4 (four) hours as needed for moderate pain.     ibuprofen 800 MG tablet  Commonly known as:  ADVIL,MOTRIN  Take 1 tablet (800 mg total) by mouth every 8 (eight) hours as needed for moderate pain.     multivitamin with minerals Tabs tablet  Take 1 tablet by mouth daily.     psyllium 28 % packet  Commonly known as:  METAMUCIL SMOOTH TEXTURE  Take 1 packet by mouth daily.     sertraline 50 MG tablet  Commonly known as:  ZOLOFT  Take 50 mg by mouth daily.     traMADol 50 MG tablet  Commonly known as:  ULTRAM  Take 1 tablet (50 mg total) by mouth every 6 (six) hours as needed.        Diagnostic Studies: Chest Portable 1 View  05/04/2015   CLINICAL DATA:  Hypertension.  Preop.  EXAM: PORTABLE CHEST - 1 VIEW  COMPARISON:  None.  FINDINGS: The heart size and mediastinal contours are within normal limits. Both lungs are clear. The visualized skeletal structures are unremarkable.  IMPRESSION: No acute cardiopulmonary abnormality seen.   Electronically Signed   By: Marijo Conception, M.D.   On: 05/04/2015 15:54   Dg Knee Complete 4 Views Left  05/04/2015   CLINICAL DATA:  Fall today  EXAM: LEFT KNEE - COMPLETE 4+ VIEW  COMPARISON:  None.  FINDINGS: Extensive soft tissue swelling superior to the patella with joint effusion. There is a bony fragment superior to the patella medially compatible with avulsed superior pole of the patella. This is a relatively small piece of avulsed bone.  Medial and lateral joint space is normal.  No other fracture.  IMPRESSION: Avulsion fracture of the superior pole patella medially. There is joint effusion and soft tissue edema/ hemorrhage superior to the patella.   Electronically Signed   By: Franchot Gallo M.D.   On: 05/04/2015 14:27    Disposition: Final discharge disposition not confirmed      Discharge Instructions    Diet -  low sodium heart healthy    Complete by:  As directed      Diet - low sodium heart healthy    Complete by:  As directed      Increase activity slowly    Complete by:  As directed      Increase activity slowly    Complete by:  As directed            Follow-up Information    Follow up with MENZ,MICHAEL, MD. Schedule an appointment as soon as possible for a visit in 3 days.   Specialty:  Orthopedic Surgery   Why:  for further treatment.  Contact information:   [place] [address] [phone]        Signed: Watt Climes 05/08/2015, 6:37 AM

## 2015-05-08 NOTE — Care Management Note (Signed)
Case Management Note  Patient Details  Name: Louis Luna MRN: [id number] Date of Birth: [birth year]  Subjective/Objective:       Also obtained a verbal order from Dr Marry Guan for home health PT.              Action/Plan:   Expected Discharge Date:  05/08/15               Expected Discharge Plan:     In-House Referral:     Discharge planning Services  CM Consult  Post Acute Care Choice:  Durable Medical Equipment, Home Health Choice offered to:  Patient  DME Arranged:    DME Agency:     HH Arranged:    [place] Agency:     Status of Service:     Medicare Important Message Given:    Date Medicare IM Given:    Medicare IM give by:    Date Additional Medicare IM Given:    Additional Medicare Important Message give by:     If discussed at [place] of Stay Meetings, dates discussed:    Additional Comments:  Thalia Turkington A, RN 05/08/2015, 1:08 PM

## 2015-05-08 NOTE — Care Management Note (Signed)
Case Management Note  Patient Details  Name: Louis Luna MRN: [id number] Date of Birth: Jan 04, 1951  Subjective/Objective:     Called Vance Peper PA for Dr Rudene Christians and received a verbal order for [name] to have home health PT.               Action/Plan:   Expected Discharge Date:  05/08/15               Expected Discharge Plan:     In-House Referral:     Discharge planning Services  CM Consult  Post Acute Care Choice:  Durable Medical Equipment, Home Health Choice offered to:  Patient  DME Arranged:    DME Agency:     HH Arranged:    [place] Agency:     Status of Service:     Medicare Important Message Given:    Date Medicare IM Given:    Medicare IM give by:    Date Additional Medicare IM Given:    Additional Medicare Important Message give by:     If discussed at [place] of Stay Meetings, dates discussed:    Additional Comments:  Stewart Pimenta A, RN 05/08/2015, 1:00 PM

## 2015-05-08 NOTE — Care Management Note (Signed)
Case Management Note  Patient Details  Name: MITUL HALLOWELL MRN: [id number] Date of Birth: 17-Feb-1951  Subjective/Objective:     Pending call back from Vance Peper per home health orders.               Action/Plan:   Expected Discharge Date:  05/08/15               Expected Discharge Plan:     In-House Referral:     Discharge planning Services  CM Consult  Post Acute Care Choice:  Durable Medical Equipment, Home Health Choice offered to:  Patient  DME Arranged:    DME Agency:     HH Arranged:    [place] Agency:     Status of Service:     Medicare Important Message Given:    Date Medicare IM Given:    Medicare IM give by:    Date Additional Medicare IM Given:    Additional Medicare Important Message give by:     If discussed at [place] of Stay Meetings, dates discussed:    Additional Comments:  Mychael Soots A, RN 05/08/2015, 9:37 AM

## 2015-05-08 NOTE — Progress Notes (Signed)
   Subjective: 3 Days Post-Op Procedure(s) (LRB): REPAIR QUADRICEP TENDON (Left) Patient reports pain as 2 on 0-10 scale.   Patient is well, and has had no acute complaints or problems Continue with physical  therapy today.  Plan is to go Home after hospital stay. no nausea and no vomiting Patient denies any chest pains or shortness of breath. Objective: Vital signs in last 24 hours: Temp:  [97.8 F (36.6 C)-99.4 F (37.4 C)] 98.7 F (37.1 C) (09/11 0408) Pulse Rate:  [58-85] 67 (09/11 0408) Resp:  [18] 18 (09/11 0408) BP: (122-141)/(65-85) 129/68 mmHg (09/11 0408) SpO2:  [93 %-98 %] 95 % (09/11 0408) well approximated incision Heels are non tender and elevated off the bed using rolled towels Intake/Output from previous day: 09/10 0701 - 09/11 0700 In: 720 [P.O.:720] Out: 1410 [Urine:1410] Intake/Output this shift: Total I/O In: -  Out: 825 [Urine:825]   Recent Labs  05/06/15 1002  HGB 13.1    Recent Labs  05/06/15 1002  WBC 7.7  RBC 4.33*  HCT 38.4*  PLT 123*    Recent Labs  05/06/15 1002  CREATININE 0.88   No results for input(s): LABPT, INR in the last 72 hours.  EXAM General - Patient is Alert, Appropriate and Oriented Extremity - Neurologically intact Neurovascular intact Sensation intact distally Intact pulses distally Dorsiflexion/Plantar flexion intact Dressing - dressing C/D/I Motor Function - intact, moving foot and toes well on exam.   History reviewed. No pertinent past medical history.  Assessment/Plan: 3 Days Post-Op Procedure(s) (LRB): REPAIR QUADRICEP TENDON (Left) Active Problems:   Quadriceps muscle rupture  Estimated body mass index is 32.7 kg/(m^2) as calculated from the following:   Height as of this encounter: 5\' 8"  (1.727 m).   Weight as of this encounter: 97.523 kg (215 lb). Up with therapy Discharge home with home health today  Labs: None DVT Prophylaxis - Lovenox Partial weightbearing left leg   Jon R. Viola Burbank 05/08/2015, 6:31 AM

## 2015-05-08 NOTE — Progress Notes (Signed)
Discharge home with home health PT. T.O: Dr Trilby Drummer, RN, BSN, CM on 05/08/15 @ 1pm.

## 2015-06-06 ENCOUNTER — Ambulatory Visit: Payer: Medicare Other | Admitting: Family Medicine

## 2015-08-18 ENCOUNTER — Other Ambulatory Visit: Payer: Self-pay

## 2015-08-18 MED ORDER — DIAZEPAM 5 MG PO TABS: 5.0000 mg | ORAL_TABLET | Freq: Every day | ORAL | Status: DC | PRN

## 2015-08-18 NOTE — Telephone Encounter (Signed)
Per Dr. Rosanna Randy, ok to send in diazepam into patient's pharmacy.

## 2015-08-24 ENCOUNTER — Other Ambulatory Visit: Payer: Self-pay | Admitting: Family Medicine

## 2015-08-25 ENCOUNTER — Telehealth: Payer: Self-pay

## 2015-08-25 MED ORDER — DIAZEPAM 5 MG PO TABS: 5.0000 mg | ORAL_TABLET | Freq: Every day | ORAL | Status: DC | PRN

## 2015-08-25 NOTE — Telephone Encounter (Signed)
Patient emailed refill request for Diazepam this is Dr.Gilbert's patient. Please review. Thank you-aa

## 2015-08-25 NOTE — Telephone Encounter (Signed)
I have signed the medication above for refills. Can we please call this into his pharmacy for him? Thank you!

## 2015-08-26 NOTE — Telephone Encounter (Signed)
Pt advised when he came by the office this morning asking about his refill, RX called in-aa

## 2015-09-18 DIAGNOSIS — G478 Other sleep disorders: Secondary | ICD-10-CM | POA: Diagnosis not present

## 2015-09-18 DIAGNOSIS — G4733 Obstructive sleep apnea (adult) (pediatric): Secondary | ICD-10-CM | POA: Diagnosis not present

## 2015-09-19 ENCOUNTER — Other Ambulatory Visit: Payer: Self-pay | Admitting: Family Medicine

## 2015-09-19 DIAGNOSIS — F329 Major depressive disorder, single episode, unspecified: Secondary | ICD-10-CM

## 2015-09-19 DIAGNOSIS — F32A Depression, unspecified: Secondary | ICD-10-CM

## 2015-09-28 ENCOUNTER — Telehealth: Payer: Self-pay

## 2015-09-28 NOTE — Telephone Encounter (Signed)
error 

## 2015-10-19 DIAGNOSIS — G4733 Obstructive sleep apnea (adult) (pediatric): Secondary | ICD-10-CM | POA: Diagnosis not present

## 2015-10-19 DIAGNOSIS — G478 Other sleep disorders: Secondary | ICD-10-CM | POA: Diagnosis not present

## 2015-11-15 ENCOUNTER — Telehealth: Payer: Self-pay

## 2015-11-15 NOTE — Telephone Encounter (Signed)
Called patient to let him know that I told him the wrong date for his CPE. His appt is 12/22/15 @9 :45 NOT on 12/15/15. Thanks!

## 2015-11-16 DIAGNOSIS — G478 Other sleep disorders: Secondary | ICD-10-CM | POA: Diagnosis not present

## 2015-11-16 DIAGNOSIS — G4733 Obstructive sleep apnea (adult) (pediatric): Secondary | ICD-10-CM | POA: Diagnosis not present

## 2015-12-17 DIAGNOSIS — G478 Other sleep disorders: Secondary | ICD-10-CM | POA: Diagnosis not present

## 2015-12-17 DIAGNOSIS — G4733 Obstructive sleep apnea (adult) (pediatric): Secondary | ICD-10-CM | POA: Diagnosis not present

## 2015-12-22 ENCOUNTER — Ambulatory Visit (INDEPENDENT_AMBULATORY_CARE_PROVIDER_SITE_OTHER): Payer: Medicare Other | Admitting: Family Medicine

## 2015-12-22 ENCOUNTER — Encounter: Payer: Self-pay | Admitting: Family Medicine

## 2015-12-22 VITALS — BP 124/82 | HR 80 | Temp 98.0°F | Resp 16 | Ht 68.0 in | Wt 232.0 lb

## 2015-12-22 DIAGNOSIS — G4733 Obstructive sleep apnea (adult) (pediatric): Secondary | ICD-10-CM | POA: Diagnosis not present

## 2015-12-22 DIAGNOSIS — F329 Major depressive disorder, single episode, unspecified: Secondary | ICD-10-CM

## 2015-12-22 DIAGNOSIS — F32A Depression, unspecified: Secondary | ICD-10-CM

## 2015-12-22 DIAGNOSIS — Z0000 Encounter for general adult medical examination without abnormal findings: Secondary | ICD-10-CM | POA: Diagnosis not present

## 2015-12-22 DIAGNOSIS — E785 Hyperlipidemia, unspecified: Secondary | ICD-10-CM | POA: Diagnosis not present

## 2015-12-22 DIAGNOSIS — I10 Essential (primary) hypertension: Secondary | ICD-10-CM | POA: Diagnosis not present

## 2015-12-22 DIAGNOSIS — Z87891 Personal history of nicotine dependence: Secondary | ICD-10-CM

## 2015-12-22 DIAGNOSIS — Z1211 Encounter for screening for malignant neoplasm of colon: Secondary | ICD-10-CM

## 2015-12-22 MED ORDER — SERTRALINE HCL 50 MG PO TABS: 50.0000 mg | ORAL_TABLET | Freq: Every day | ORAL | Status: DC

## 2015-12-22 MED ORDER — DIAZEPAM 5 MG PO TABS: 5.0000 mg | ORAL_TABLET | Freq: Every day | ORAL | Status: DC | PRN

## 2015-12-22 MED ORDER — ATENOLOL 50 MG PO TABS: 50.0000 mg | ORAL_TABLET | Freq: Every day | ORAL | Status: DC

## 2015-12-22 NOTE — Progress Notes (Signed)
Patient ID: Louis Luna, male   DOB: 26-Sep-1950, 65 y.o.   MRN: [id number]       Patient: Louis Luna, Male    DOB: 04-20-1951, 65 y.o.   MRN: [id number] Visit Date: 12/22/2015  Today's Provider: [name], MD   Chief Complaint  Patient presents with  . Medicare annual wellness visit  . Hypertension   Subjective:    Annual wellness visit Louis Luna is a 65 y.o. male. He feels well. He reports exercising 3 times a week. He reports he is sleeping well. He sleeps on average 7 hours a night.  Colonoscopy- never Tdap- 08/19/2007 Zoster- 02/07/2012   Hypertension, follow-up:  BP Readings from Last 3 Encounters:  12/22/15 124/82  05/08/15 136/67  02/02/15 168/100    He was last seen for hypertension 6 months ago.  BP at that visit was 160/100. Management since that visit includes no changes. He reports good compliance with treatment. He is not having side effects.  He is exercising. He is adherent to low salt diet.   Outside blood pressures are checked occasionally. Patient denies chest pressure/discomfort, irregular heart beat, palpitations and tachypnea.      Weight trend: stable Wt Readings from Last 3 Encounters:  12/22/15 232 lb (105.235 kg)  05/04/15 215 lb (97.523 kg)  02/02/15 217 lb (98.431 kg)    Current diet: well balanced    Lipid/Cholesterol, Follow-up:   Last seen for this 6 months ago.  Management changes since that visit include no changes. . Last Lipid Panel:    Component Value Date/Time   CHOL 162 03/11/2013   TRIG 49 03/11/2013   HDL 42 03/11/2013   LDLCALC 110 03/11/2013    He reports good compliance with treatment. He is not having side effects.  Current symptoms include none and have been stable. Weight trend: stable Prior visit with dietician: no Current diet: well balanced Current exercise: walking  Wt Readings from Last 3 Encounters:  12/22/15 232 lb (105.235 kg)  05/04/15 215 lb (97.523 kg)    02/02/15 217 lb (98.431 kg)     Review of Systems  Constitutional: Negative.   HENT: Negative.   Eyes: Negative.   Respiratory: Negative.   Cardiovascular: Negative.   Gastrointestinal: Negative.   Endocrine: Negative.   Genitourinary: Negative.   Musculoskeletal: Positive for myalgias and arthralgias. Negative for back pain, joint swelling, gait problem and neck pain.  Skin: Negative.   Allergic/Immunologic: Negative.   Neurological: Negative.   Hematological: Negative.   Psychiatric/Behavioral: Negative.     Social History   Social History  . Marital Status: Married    Spouse Name: [name]  . Number of Children: 2  . Years of Education: 18   Occupational History  . retired    Social History Main Topics  . Smoking status: Former Smoker -- 1.00 packs/day for 30 years    Types: Cigarettes    Quit date: 10/26/2011  . Smokeless tobacco: Never Used  . Alcohol Use: 0.6 oz/week    1 Cans of beer per week     Comment: ocassionally-beer  . Drug Use: No  . Sexual Activity: No   Other Topics Concern  . Not on file   Social History Narrative    History reviewed. No pertinent past medical history.   Patient Active Problem List   Diagnosis Date Noted  . Quadriceps muscle rupture 05/04/2015  . Atherosclerosis of coronary artery 01/27/2015  . Clinical depression 01/27/2015  . Acid reflux 01/27/2015  .  History of tobacco use 01/27/2015  . HLD (hyperlipidemia) 01/27/2015  . BP (high blood pressure) 01/27/2015  . Fatty tumor 01/27/2015  . Obstructive sleep apnea 01/27/2015    Past Surgical History  Procedure Laterality Date  . Hernia repair      inguinal-left 04/2009  . Tumor excision      from Rt side of the neck-benign  . Ankle surgery    . Appendectomy    . Quadriceps tendon repair Left 05/05/2015    Procedure: REPAIR QUADRICEP TENDON;  Surgeon: Hessie Knows, MD;  Location: [facility] ORS;  Service: Orthopedics;  Laterality: Left;    His family history includes  Alcohol abuse in his brother and father; Alzheimer's disease in his father; Breast cancer in his mother, sister, and sister; Depression in his sister and sister; Diabetes in his mother; Hypertension in his brother, brother, father, and mother; Melanoma in his sister.    Previous Medications   ASPIRIN EC 81 MG TABLET    Take 81 mg by mouth daily.   HYDROCODONE-ACETAMINOPHEN (NORCO/VICODIN) 5-325 MG PER TABLET    Take 1-2 tablets by mouth every 4 (four) hours as needed for moderate pain.   HYDROCODONE-ACETAMINOPHEN (NORCO/VICODIN) 5-325 MG PER TABLET    Take 1-2 tablets by mouth every 4 (four) hours as needed for moderate pain.   IBUPROFEN (ADVIL,MOTRIN) 800 MG TABLET    Take 1 tablet (800 mg total) by mouth every 8 (eight) hours as needed for moderate pain.   MULTIPLE VITAMIN (MULTIVITAMIN WITH MINERALS) TABS TABLET    Take 1 tablet by mouth daily.   PSYLLIUM (METAMUCIL SMOOTH TEXTURE) 28 % PACKET    Take 1 packet by mouth daily.   TRAMADOL (ULTRAM) 50 MG TABLET    Take 1 tablet (50 mg total) by mouth every 6 (six) hours as needed.    Patient Care Team: Jerrol Banana., MD as PCP - General (Family Medicine)     Objective:   Vitals: BP 124/82 mmHg  Pulse 80  Temp(Src) 98 F (36.7 C)  Resp 16  Ht 5\' 8"  (1.727 m)  Wt 232 lb (105.235 kg)  BMI 35.28 kg/m2  Physical Exam  Constitutional: He is oriented to person, place, and time. He appears well-developed and well-nourished.  HENT:  Head: Normocephalic and atraumatic.  Right Ear: External ear normal.  Left Ear: External ear normal.  Nose: Nose normal.  Eyes: Conjunctivae are normal.  Neck: Neck supple. No thyromegaly present.  Cardiovascular: Normal rate, regular rhythm and normal heart sounds.   Pulmonary/Chest: Effort normal and breath sounds normal.  Abdominal: Soft.  Genitourinary: Rectum normal, prostate normal and penis normal.  Lymphadenopathy:    He has no cervical adenopathy.  Neurological: He is alert and oriented  to person, place, and time.  Skin: Skin is warm and dry.  Lipomas on arms and trunk.  Psychiatric: He has a normal mood and affect. His behavior is normal. Judgment and thought content normal.    Activities of Daily Living In your present state of health, do you have any difficulty performing the following activities: 12/22/2015 05/04/2015  Hearing? N -  Vision? N -  Difficulty concentrating or making decisions? N -  Walking or climbing stairs? N -  Dressing or bathing? N -  Doing errands, shopping? N N    Fall Risk Assessment Fall Risk  12/22/2015  Falls in the past year? Yes  Number falls in past yr: 1  Injury with Fall? Yes     Depression Screen  PHQ 2/9 Scores 12/22/2015  PHQ - 2 Score 0    Cognitive Testing - 6-CIT  Correct? Score   What year is it? yes 0 0 or 4  What month is it? yes 0 0 or 3  Memorize:    Pia Mau,  41,  [address]  [place],      What time is it? (within 1 hour) yes 0 0 or 3  Count backwards from 20 yes 0 0, 2, or 4  Name the months of the year yes 0 0, 2, or 4  Repeat name & address above no 4 0, 2, 4, 6, 8, or 10       TOTAL SCORE  4/28   Interpretation:  Normal  Normal (0-7) Abnormal (8-28)       Assessment & Plan:     Annual Wellness Visit  Reviewed patient's Family Medical History Reviewed and updated list of patient's medical providers Assessment of cognitive impairment was done Assessed patient's functional ability Established a written schedule for health screening [place] Completed and Reviewed  Exercise Activities and Dietary recommendations Goals    None      Immunization History  Administered Date(s) Administered  . Tdap 08/19/2007  . Zoster 02/07/2012    Health Maintenance  Topic Date Due  . Hepatitis C Screening  04-Jan-1951  . COLONOSCOPY  09/16/1999  . PNA vac Low Risk Adult (1 of 2 - PCV13) 09/15/2014  . INFLUENZA VACCINE  03/27/2016  . TETANUS/TDAP  08/18/2017  . ZOSTAVAX   Completed      Discussed health benefits of physical activity, and encouraged him to engage in regular exercise appropriate for his age and condition.    1. Medicare annual wellness visit, subsequent   2. Obstructive sleep apnea  - CBC with Differential/Platelet - TSH  3. Essential hypertension  - Comprehensive metabolic panel - TSH  4. HLD (hyperlipidemia)  - Lipid panel  5. History of tobacco use 1ppd for 40 years. - CT CHEST LUNG CA SCREEN LOW DOSE W/O CM; Future  6. Colon cancer screening  - Ambulatory referral to Gastroenterology  7. Clinical depression Stable for years. - sertraline (ZOLOFT) 50 MG tablet; Take 1 tablet (50 mg total) by mouth daily.  Dispense: 30 tablet; Refill: 12  I have done the exam and reviewed the above chart and it is accurate to the best of my knowledge.

## 2015-12-26 ENCOUNTER — Telehealth: Payer: Self-pay | Admitting: *Deleted

## 2015-12-26 NOTE — Telephone Encounter (Signed)
Received referral for low dose lung cancer screening CT scan. Voicemail left at phone number listed in EMR for patient to call me back to facilitate scheduling scan.  

## 2015-12-27 ENCOUNTER — Telehealth: Payer: Self-pay | Admitting: Family Medicine

## 2015-12-27 NOTE — Telephone Encounter (Signed)
Pt would like to get lab work done tomorrow morning.He states that he will need [facility] form to get this done

## 2015-12-27 NOTE — Telephone Encounter (Signed)
Pt advised that lab slip re printed, he lost his original slip-aa

## 2015-12-28 DIAGNOSIS — I10 Essential (primary) hypertension: Secondary | ICD-10-CM | POA: Diagnosis not present

## 2015-12-28 DIAGNOSIS — E785 Hyperlipidemia, unspecified: Secondary | ICD-10-CM | POA: Diagnosis not present

## 2015-12-28 DIAGNOSIS — G4733 Obstructive sleep apnea (adult) (pediatric): Secondary | ICD-10-CM | POA: Diagnosis not present

## 2015-12-29 LAB — CBC WITH DIFFERENTIAL/PLATELET
Basophils Absolute: 0.1 10*3/uL (ref 0.0–0.2)
Basos: 1 %
EOS (ABSOLUTE): 0.2 10*3/uL (ref 0.0–0.4)
Eos: 4 %
HEMATOCRIT: 41.1 % (ref 37.5–51.0)
Hemoglobin: 13.7 g/dL (ref 12.6–17.7)
IMMATURE GRANULOCYTES: 0 %
Immature Grans (Abs): 0 10*3/uL (ref 0.0–0.1)
LYMPHS ABS: 1.4 10*3/uL (ref 0.7–3.1)
LYMPHS: 26 %
MCH: 29.8 pg (ref 26.6–33.0)
MCHC: 33.3 g/dL (ref 31.5–35.7)
MCV: 90 fL (ref 79–97)
MONOS ABS: 0.5 10*3/uL (ref 0.1–0.9)
Monocytes: 10 %
Neutrophils Absolute: 3.2 10*3/uL (ref 1.4–7.0)
Neutrophils: 59 %
PLATELETS: 158 10*3/uL (ref 150–379)
RBC: 4.59 x10E6/uL (ref 4.14–5.80)
RDW: 14.9 % (ref 12.3–15.4)
WBC: 5.5 10*3/uL (ref 3.4–10.8)

## 2015-12-29 LAB — COMPREHENSIVE METABOLIC PANEL
A/G RATIO: 1.9 (ref 1.2–2.2)
ALT: 20 IU/L (ref 0–44)
AST: 23 IU/L (ref 0–40)
Albumin: 4.2 g/dL (ref 3.6–4.8)
Alkaline Phosphatase: 85 IU/L (ref 39–117)
BILIRUBIN TOTAL: 0.8 mg/dL (ref 0.0–1.2)
BUN/Creatinine Ratio: 17 (ref 10–24)
BUN: 16 mg/dL (ref 8–27)
CALCIUM: 9.2 mg/dL (ref 8.6–10.2)
CHLORIDE: 102 mmol/L (ref 96–106)
CO2: 24 mmol/L (ref 18–29)
Creatinine, Ser: 0.92 mg/dL (ref 0.76–1.27)
GFR calc Af Amer: 100 mL/min/{1.73_m2} (ref >59)
GFR, EST NON AFRICAN AMERICAN: 86 mL/min/{1.73_m2} (ref >59)
GLOBULIN, TOTAL: 2.2 g/dL (ref 1.5–4.5)
Glucose: 99 mg/dL (ref 65–99)
POTASSIUM: 4.6 mmol/L (ref 3.5–5.2)
SODIUM: 142 mmol/L (ref 134–144)
Total Protein: 6.4 g/dL (ref 6.0–8.5)

## 2015-12-29 LAB — LIPID PANEL
CHOL/HDL RATIO: 4.1 ratio (ref 0.0–5.0)
Cholesterol, Total: 153 mg/dL (ref 100–199)
HDL: 37 mg/dL — AB (ref >39)
LDL Calculated: 105 mg/dL — ABNORMAL HIGH (ref 0–99)
TRIGLYCERIDES: 53 mg/dL (ref 0–149)
VLDL Cholesterol Cal: 11 mg/dL (ref 5–40)

## 2015-12-29 LAB — TSH: TSH: 1.91 u[IU]/mL (ref 0.450–4.500)

## 2016-01-17 ENCOUNTER — Telehealth: Payer: Self-pay | Admitting: *Deleted

## 2016-01-17 NOTE — Telephone Encounter (Signed)
Received referral for initial lung cancer screening scan. Contacted patient and obtained smoking history (former smoker, quit 10/26/11, 30 pack year history), as well as answering questions related to screening process. Patient is tentatively scheduled for shared decision making visit and CT scan on 01/27/16, pending insurance approval from business office.

## 2016-01-26 ENCOUNTER — Other Ambulatory Visit: Payer: Self-pay | Admitting: Family Medicine

## 2016-01-26 ENCOUNTER — Encounter: Payer: Self-pay | Admitting: Family Medicine

## 2016-01-26 DIAGNOSIS — Z87891 Personal history of nicotine dependence: Secondary | ICD-10-CM

## 2016-01-26 HISTORY — DX: Personal history of nicotine dependence: Z87.891

## 2016-01-27 ENCOUNTER — Inpatient Hospital Stay: Payer: Medicare Other | Attending: Family Medicine | Admitting: Family Medicine

## 2016-01-27 ENCOUNTER — Encounter: Payer: Self-pay | Admitting: Family Medicine

## 2016-01-27 ENCOUNTER — Ambulatory Visit
Admission: RE | Admit: 2016-01-27 | Discharge: 2016-01-27 | Disposition: A | Discharge status: Home / Self Care | Payer: Medicare Other | Source: Ambulatory Visit | Attending: Family Medicine | Admitting: Family Medicine

## 2016-01-27 DIAGNOSIS — Z87891 Personal history of nicotine dependence: Secondary | ICD-10-CM | POA: Diagnosis not present

## 2016-01-27 DIAGNOSIS — Z122 Encounter for screening for malignant neoplasm of respiratory organs: Secondary | ICD-10-CM | POA: Diagnosis not present

## 2016-01-27 DIAGNOSIS — K769 Liver disease, unspecified: Secondary | ICD-10-CM | POA: Insufficient documentation

## 2016-01-27 NOTE — Progress Notes (Signed)
In accordance with CMS guidelines, patient has meet eligibility criteria including age, absence of signs or symptoms of lung cancer, the specific calculation of cigarette smoking pack-years was 30 years and is a former smoker, having quit in 2013.   A shared decision-making session was conducted prior to the performance of CT scan. This includes one or more decision aids, includes benefits and harms of screening, follow-up diagnostic testing, over-diagnosis, false positive rate, and total radiation exposure.  Counseling on the importance of adherence to annual lung cancer LDCT screening, impact of co-morbidities, and ability or willingness to undergo diagnosis and treatment is imperative for compliance of the program.  Counseling on the importance of continued smoking cessation for former smokers; the importance of smoking cessation for current smokers and information about tobacco cessation interventions have been given to patient including the [place] at [facility], 1800 quit [place], as well as [place] specific smoking cessation programs.  Written order for lung cancer screening with LDCT has been given to the patient and any and all questions have been answered to the best of my abilities.   Yearly follow up will be scheduled by Burgess Estelle, Thoracic Navigator.

## 2016-01-30 ENCOUNTER — Telehealth: Payer: Self-pay | Admitting: *Deleted

## 2016-01-30 NOTE — Telephone Encounter (Signed)
Notified patient of LDCT lung cancer screening results with recommendation for 12 month follow up imaging. Also notified of incidental finding noted below. Patient verbalizes understanding.   IMPRESSION: 1. Lung-RADS Category 1, negative. Continue annual screening with low-dose chest CT without contrast in 12 months. 2. Coronary artery calcification. 3. Enlarging low-attenuation lesion in the liver, likely a cyst although definitive characterization is limited without post-contrast imaging.

## 2016-03-14 ENCOUNTER — Encounter: Payer: Self-pay | Admitting: *Deleted

## 2016-03-22 ENCOUNTER — Encounter: Payer: Self-pay | Admitting: *Deleted

## 2016-03-23 ENCOUNTER — Ambulatory Visit: Payer: Medicare Other | Admitting: Anesthesiology

## 2016-03-23 ENCOUNTER — Ambulatory Visit
Admission: RE | Admit: 2016-03-23 | Discharge: 2016-03-23 | Disposition: A | Discharge status: Home / Self Care | Payer: Medicare Other | Source: Ambulatory Visit | Attending: Unknown Physician Specialty | Admitting: Unknown Physician Specialty

## 2016-03-23 ENCOUNTER — Encounter: Payer: Self-pay | Admitting: *Deleted

## 2016-03-23 ENCOUNTER — Ambulatory Visit
Admission: RE | Admit: 2016-03-23 | Payer: Medicare Other | Source: Ambulatory Visit | Admitting: Unknown Physician Specialty

## 2016-03-23 ENCOUNTER — Encounter: Admission: RE | Payer: Self-pay | Source: Ambulatory Visit

## 2016-03-23 ENCOUNTER — Encounter
Admission: RE | Disposition: A | Discharge status: Home / Self Care | Payer: Self-pay | Source: Ambulatory Visit | Attending: Unknown Physician Specialty

## 2016-03-23 DIAGNOSIS — K219 Gastro-esophageal reflux disease without esophagitis: Secondary | ICD-10-CM | POA: Insufficient documentation

## 2016-03-23 DIAGNOSIS — F329 Major depressive disorder, single episode, unspecified: Secondary | ICD-10-CM | POA: Insufficient documentation

## 2016-03-23 DIAGNOSIS — D125 Benign neoplasm of sigmoid colon: Secondary | ICD-10-CM | POA: Insufficient documentation

## 2016-03-23 DIAGNOSIS — K5730 Diverticulosis of large intestine without perforation or abscess without bleeding: Secondary | ICD-10-CM | POA: Diagnosis not present

## 2016-03-23 DIAGNOSIS — Z87891 Personal history of nicotine dependence: Secondary | ICD-10-CM | POA: Diagnosis not present

## 2016-03-23 DIAGNOSIS — I2510 Atherosclerotic heart disease of native coronary artery without angina pectoris: Secondary | ICD-10-CM | POA: Diagnosis not present

## 2016-03-23 DIAGNOSIS — I10 Essential (primary) hypertension: Secondary | ICD-10-CM | POA: Diagnosis not present

## 2016-03-23 DIAGNOSIS — Z7982 Long term (current) use of aspirin: Secondary | ICD-10-CM | POA: Insufficient documentation

## 2016-03-23 DIAGNOSIS — Z1211 Encounter for screening for malignant neoplasm of colon: Secondary | ICD-10-CM | POA: Diagnosis not present

## 2016-03-23 DIAGNOSIS — K635 Polyp of colon: Secondary | ICD-10-CM | POA: Diagnosis not present

## 2016-03-23 DIAGNOSIS — K648 Other hemorrhoids: Secondary | ICD-10-CM | POA: Diagnosis not present

## 2016-03-23 DIAGNOSIS — G4730 Sleep apnea, unspecified: Secondary | ICD-10-CM | POA: Insufficient documentation

## 2016-03-23 DIAGNOSIS — Z79899 Other long term (current) drug therapy: Secondary | ICD-10-CM | POA: Diagnosis not present

## 2016-03-23 DIAGNOSIS — K640 First degree hemorrhoids: Secondary | ICD-10-CM | POA: Diagnosis not present

## 2016-03-23 DIAGNOSIS — K5790 Diverticulosis of intestine, part unspecified, without perforation or abscess without bleeding: Secondary | ICD-10-CM | POA: Diagnosis not present

## 2016-03-23 HISTORY — DX: Essential (primary) hypertension: I10

## 2016-03-23 HISTORY — PX: COLONOSCOPY: SHX5424

## 2016-03-23 HISTORY — DX: Major depressive disorder, single episode, unspecified: F32.9

## 2016-03-23 HISTORY — DX: Personal history of other endocrine, nutritional and metabolic disease: Z86.39

## 2016-03-23 HISTORY — DX: Gastro-esophageal reflux disease without esophagitis: K21.9

## 2016-03-23 HISTORY — DX: Atherosclerotic heart disease of native coronary artery without angina pectoris: I25.10

## 2016-03-23 HISTORY — DX: Sleep apnea, unspecified: G47.30

## 2016-03-23 HISTORY — DX: Depression, unspecified: F32.A

## 2016-03-23 LAB — SURGICAL PATHOLOGY

## 2016-03-23 LAB — HM COLONOSCOPY

## 2016-03-23 SURGERY — COLONOSCOPY
Anesthesia: General

## 2016-03-23 SURGERY — COLONOSCOPY WITH PROPOFOL
Anesthesia: General

## 2016-03-23 MED ORDER — PROPOFOL 500 MG/50ML IV EMUL
INTRAVENOUS | Status: DC | PRN
  Administered 2016-03-23: 125 ug/kg/min via INTRAVENOUS

## 2016-03-23 MED ORDER — LACTATED RINGERS IV SOLN
INTRAVENOUS | Status: DC | PRN
  Administered 2016-03-23: 14:00:00 via INTRAVENOUS

## 2016-03-23 MED ORDER — PROPOFOL 10 MG/ML IV BOLUS
INTRAVENOUS | Status: DC | PRN
  Administered 2016-03-23: 100 mg via INTRAVENOUS

## 2016-03-23 MED ORDER — SODIUM CHLORIDE 0.9 % IV SOLN: INTRAVENOUS | Status: DC

## 2016-03-23 MED ORDER — ONDANSETRON HCL 4 MG/2ML IJ SOLN: 4.0000 mg | Freq: Once | INTRAMUSCULAR | Status: DC | PRN

## 2016-03-23 MED ORDER — SODIUM CHLORIDE 0.9 % IV SOLN
INTRAVENOUS | Status: DC
  Administered 2016-03-23: 1000 mL via INTRAVENOUS

## 2016-03-23 MED ORDER — FENTANYL CITRATE (PF) 100 MCG/2ML IJ SOLN: 25.0000 ug | INTRAMUSCULAR | Status: DC | PRN

## 2016-03-23 MED ORDER — EPHEDRINE SULFATE 50 MG/ML IJ SOLN
INTRAMUSCULAR | Status: DC | PRN
  Administered 2016-03-23: 10 mg via INTRAVENOUS

## 2016-03-23 NOTE — H&P (Signed)
Primary Care Physician:  [name], MD Primary Gastroenterologist:  Dr. Vira Agar  Pre-Procedure History & Physical: HPI:  Louis Luna is a 65 y.o. male is here for an colonoscopy.   Past Medical History:  Diagnosis Date  . Coronary artery disease   . Depression   . GERD (gastroesophageal reflux disease)   . H/O elevated lipids   . Hypertension   . Personal history of tobacco use, presenting hazards to health 01/26/2016  . Sleep apnea     Past Surgical History:  Procedure Laterality Date  . ANKLE SURGERY    . APPENDECTOMY    . HERNIA REPAIR     inguinal-left 04/2009  . QUADRICEPS TENDON REPAIR Left 05/05/2015   Procedure: REPAIR QUADRICEP TENDON;  Surgeon: Hessie Knows, MD;  Location: [facility] ORS;  Service: Orthopedics;  Laterality: Left;  . TUMOR EXCISION     from Rt side of the neck-benign    Prior to Admission medications   Medication Sig Start Date End Date Taking? Authorizing Provider  aspirin EC 81 MG tablet Take 81 mg by mouth daily.   Yes Historical Provider, MD  atenolol (TENORMIN) 50 MG tablet Take 1 tablet (50 mg total) by mouth daily. 12/22/15  Yes Richard Maceo Pro., MD  diazepam (VALIUM) 5 MG tablet Take 1 tablet (5 mg total) by mouth daily as needed for anxiety. 12/22/15  Yes Richard Maceo Pro., MD  HYDROcodone-acetaminophen (NORCO/VICODIN) 5-325 MG per tablet Take 1-2 tablets by mouth every 4 (four) hours as needed for moderate pain. 05/07/15  Yes Watt Climes, PA  HYDROcodone-acetaminophen (NORCO/VICODIN) 5-325 MG per tablet Take 1-2 tablets by mouth every 4 (four) hours as needed for moderate pain. 05/08/15  Yes Watt Climes, PA  ibuprofen (ADVIL,MOTRIN) 800 MG tablet Take 1 tablet (800 mg total) by mouth every 8 (eight) hours as needed for moderate pain. 05/04/15  Yes Sable Feil, PA-C  Multiple Vitamin (MULTIVITAMIN WITH MINERALS) TABS tablet Take 1 tablet by mouth daily.   Yes Historical Provider, MD  psyllium (METAMUCIL SMOOTH TEXTURE) 28 % packet  Take 1 packet by mouth daily.   Yes Historical Provider, MD  sertraline (ZOLOFT) 50 MG tablet Take 1 tablet (50 mg total) by mouth daily. 12/22/15  Yes Richard Maceo Pro., MD  traMADol (ULTRAM) 50 MG tablet Take 1 tablet (50 mg total) by mouth every 6 (six) hours as needed. 05/04/15 05/03/16 Yes Sable Feil, PA-C    Allergies as of 02/22/2016  . (No Known Allergies)    Family History  Problem Relation Age of Onset  . Diabetes Mother   . Hypertension Mother   . Breast cancer Mother   . Alcohol abuse Father   . Hypertension Father   . Alzheimer's disease Father   . Alcohol abuse Brother   . Hypertension Brother   . Breast cancer Sister   . Melanoma Sister   . Depression Sister   . Breast cancer Sister   . Depression Sister   . Hypertension Brother     Social History   Social History  . Marital status: Married    Spouse name: [name]  . Number of children: 2  . Years of education: 80   Occupational History  . retired    Social History Main Topics  . Smoking status: Former Smoker    Packs/day: 1.00    Years: 30.00    Types: Cigarettes    Quit date: 10/26/2011  . Smokeless tobacco: Never Used  .  Alcohol use 0.6 oz/week    1 Cans of beer per week     Comment: ocassionally-beer  . Drug use: No  . Sexual activity: No   Other Topics Concern  . Not on file   Social History Narrative  . No narrative on file    Review of Systems: See HPI, otherwise negative ROS  Physical Exam: BP (!) 142/80   Pulse (!) 59   Temp 97.6 F (36.4 C) (Tympanic)   Resp 18   Ht 5\' 9"  (1.753 m)   Wt 102.1 kg (225 lb)   SpO2 97%   BMI 33.23 kg/m  General:   Alert,  pleasant and cooperative in NAD Head:  Normocephalic and atraumatic. Neck:  Supple; no masses or thyromegaly. Lungs:  Clear throughout to auscultation.    Heart:  Regular rate and rhythm. Abdomen:  Soft, nontender and nondistended. Normal bowel sounds, without guarding, and without rebound.   Neurologic:  Alert and   oriented x4;  grossly normal neurologically.  Impression/Plan: [name] is here for an colonoscopy to be performed for screening  Risks, benefits, limitations, and alternatives regarding  colonoscopy have been reviewed with the patient.  Questions have been answered.  All parties agreeable.   Gaylyn Cheers, MD  03/23/2016, 1:36 PM

## 2016-03-23 NOTE — Anesthesia Postprocedure Evaluation (Signed)
Anesthesia Post Note  Patient: Louis Luna  Procedure(s) Performed: Procedure(s) (LRB): COLONOSCOPY (N/A)  Patient location during evaluation: PACU Anesthesia Type: General Level of consciousness: awake and alert and oriented Pain management: pain level controlled Vital Signs Assessment: post-procedure vital signs reviewed and stable Respiratory status: spontaneous breathing Cardiovascular status: blood pressure returned to baseline Anesthetic complications: no    Last Vitals:  Vitals:   03/23/16 1433 03/23/16 1443  BP: 107/65 109/73  Pulse: (!) 52 (!) 56  Resp: (!) 22 (!) 22  Temp:      Last Pain:  Vitals:   03/23/16 1413  TempSrc: Tympanic                 Joelly Bolanos

## 2016-03-23 NOTE — Op Note (Signed)
[facility]astroenter[facility] Patient Name: Louis Luna Procedure Date: 03/23/2016 1:38 PM MRN: [id number] Account #: [id number] Date of Birth: April 30, 1951 Admit Type: Outpatient Age: 65 Room: [facility] ENDO ROOM 1 Gender: Male Note Status: Finalized Procedure:            Colonoscopy Indications:          Screening for colorectal malignant neoplasm Providers:            Scot Jun, MD Referring MD:         Ferdinand Lango. Sullivan Lone, MD (Referring MD) Medicines:            Propofol per Anesthesia Complications:        No immediate complications. Procedure:            Pre-Anesthesia Assessment:                       - After reviewing the risks and benefits, the patient                        was deemed in satisfactory condition to undergo the                        procedure.                       After obtaining informed consent, the colonoscope was                        passed under direct vision. Throughout the procedure,                        the patient's blood pressure, pulse, and oxygen                        saturations were monitored continuously. The                        Colonoscope was introduced through the anus and                        advanced to the the cecum, identified by appendiceal                        orifice and ileocecal valve. The colonoscopy was                        performed without difficulty. The patient tolerated the                        procedure well. The quality of the bowel preparation                        was excellent. Findings:      A diminutive polyp was found in the sigmoid colon. The polyp was       sessile. The polyp was removed with a jumbo cold forceps. Resection and       retrieval were complete.      Multiple medium-mouthed diverticula were found in the sigmoid colon.      Internal hemorrhoids were found during endoscopy. The hemorrhoids were       medium-sized and Grade  I (internal hemorrhoids that do not  prolapse).      The exam was otherwise without abnormality. Impression:           - One diminutive polyp in the sigmoid colon, removed                        with a jumbo cold forceps. Resected and retrieved.                       - Diverticulosis in the sigmoid colon.                       - Internal hemorrhoids.                       - The examination was otherwise normal. Recommendation:       - Await pathology results. Manya Silvas, MD 03/23/2016 2:05:40 PM This report has been signed electronically. Number of Addenda: 0 Note Initiated On: 03/23/2016 1:38 PM Scope Withdrawal Time: 0 hours 14 minutes 4 seconds  Total Procedure Duration: 0 hours 19 minutes 21 seconds       [facility]

## 2016-03-23 NOTE — Anesthesia Preprocedure Evaluation (Signed)
Anesthesia Evaluation  Patient identified by MRN, date of birth, ID band Patient awake    Reviewed: Allergy & Precautions, NPO status , Patient's Chart, lab work & pertinent test results, reviewed documented beta blocker date and time   Airway Mallampati: II  TM Distance: >3 FB     Dental  (+) Chipped   Pulmonary sleep apnea , former smoker,    Pulmonary exam normal        Cardiovascular hypertension, Pt. on medications and Pt. on home beta blockers + CAD  Normal cardiovascular exam     Neuro/Psych PSYCHIATRIC DISORDERS Depression    GI/Hepatic Neg liver ROS, GERD  Medicated,  Endo/Other  negative endocrine ROS  Renal/GU negative Renal ROS  negative genitourinary   Musculoskeletal negative musculoskeletal ROS (+) Quadriceps rupture   Abdominal Normal abdominal exam  (+)   Peds negative pediatric ROS (+)  Hematology negative hematology ROS (+)   Anesthesia Other Findings   Reproductive/Obstetrics                             Anesthesia Physical  Anesthesia Plan  ASA: III  Anesthesia Plan: General   Post-op Pain Management:    Induction: Intravenous  Airway Management Planned: Nasal Cannula  Additional Equipment:   Intra-op Plan:   Post-operative Plan:   Informed Consent: I have reviewed the patients History and Physical, chart, labs and discussed the procedure including the risks, benefits and alternatives for the proposed anesthesia with the patient or authorized representative who has indicated his/her understanding and acceptance.   Dental advisory given  Plan Discussed with: CRNA and Surgeon  Anesthesia Plan Comments:         Anesthesia Quick Evaluation

## 2016-03-23 NOTE — Transfer of Care (Signed)
Immediate Anesthesia Transfer of Care Note  Patient: Louis Luna  Procedure(s) Performed: Procedure(s): COLONOSCOPY (N/A)  Patient Location: PACU  Anesthesia Type:General  Level of Consciousness: awake  Airway & Oxygen Therapy: Patient Spontanous Breathing and Patient connected to nasal cannula oxygen  Post-op Assessment: Report given to RN and Post -op Vital signs reviewed and stable  Post vital signs: Reviewed and stable  Last Vitals:  Vitals:   03/23/16 1304 03/23/16 1413  BP: (!) 142/80 117/62  Pulse: (!) 59 71  Resp: 18 18  Temp: 36.4 C 36.3 C    Last Pain:  Vitals:   03/23/16 1413  TempSrc: Tympanic         Complications: No apparent anesthesia complications

## 2016-03-26 ENCOUNTER — Encounter: Payer: Self-pay | Admitting: Unknown Physician Specialty

## 2016-04-23 ENCOUNTER — Ambulatory Visit
Admission: RE | Admit: 2016-04-23 | Discharge: 2016-04-23 | Disposition: A | Discharge status: Home / Self Care | Payer: Medicare Other | Source: Ambulatory Visit | Attending: Family Medicine | Admitting: Family Medicine

## 2016-04-23 ENCOUNTER — Ambulatory Visit (INDEPENDENT_AMBULATORY_CARE_PROVIDER_SITE_OTHER): Payer: Medicare Other | Admitting: Family Medicine

## 2016-04-23 VITALS — BP 118/88 | HR 72 | Temp 98.2°F | Resp 14 | Wt 231.0 lb

## 2016-04-23 DIAGNOSIS — M7051 Other bursitis of knee, right knee: Secondary | ICD-10-CM | POA: Insufficient documentation

## 2016-04-23 DIAGNOSIS — I739 Peripheral vascular disease, unspecified: Secondary | ICD-10-CM | POA: Diagnosis not present

## 2016-04-23 DIAGNOSIS — M1711 Unilateral primary osteoarthritis, right knee: Secondary | ICD-10-CM | POA: Diagnosis not present

## 2016-04-23 DIAGNOSIS — M179 Osteoarthritis of knee, unspecified: Secondary | ICD-10-CM | POA: Diagnosis not present

## 2016-04-23 DIAGNOSIS — I10 Essential (primary) hypertension: Secondary | ICD-10-CM

## 2016-04-23 DIAGNOSIS — G4733 Obstructive sleep apnea (adult) (pediatric): Secondary | ICD-10-CM | POA: Diagnosis not present

## 2016-04-23 MED ORDER — NAPROXEN 500 MG PO TABS: 500.0000 mg | ORAL_TABLET | Freq: Two times a day (BID) | ORAL | 0 refills | Status: DC

## 2016-04-23 NOTE — Progress Notes (Signed)
Subjective:  HPI  Patient has had right knee pain for 2 weeks now. Pain started after he mowed the yard and had to physically push the lawnmower for about 3 hours due to it not working properly automatically. Ever since then he has had achy/naggin pain located in the back of the right knee and inner lateral area. No weakness in the leg, no radiation of the pain to other areas. No redness or swelling. He has used Voltaren gel he had from before and this did help him.  Prior to Admission medications   Medication Sig Start Date End Date Taking? Authorizing Provider  aspirin EC 81 MG tablet Take 81 mg by mouth daily.   Yes Historical Provider, MD  atenolol (TENORMIN) 50 MG tablet Take 1 tablet (50 mg total) by mouth daily. 12/22/15  Yes Richard Maceo Pro., MD  diazepam (VALIUM) 5 MG tablet Take 1 tablet (5 mg total) by mouth daily as needed for anxiety. 12/22/15  Yes Richard Maceo Pro., MD  ibuprofen (ADVIL,MOTRIN) 800 MG tablet Take 1 tablet (800 mg total) by mouth every 8 (eight) hours as needed for moderate pain. 05/04/15  Yes Sable Feil, PA-C  Multiple Vitamin (MULTIVITAMIN WITH MINERALS) TABS tablet Take 1 tablet by mouth daily.   Yes Historical Provider, MD  psyllium (METAMUCIL SMOOTH TEXTURE) 28 % packet Take 1 packet by mouth daily.   Yes Historical Provider, MD  sertraline (ZOLOFT) 50 MG tablet Take 1 tablet (50 mg total) by mouth daily. 12/22/15  Yes Richard Maceo Pro., MD  traMADol (ULTRAM) 50 MG tablet Take 1 tablet (50 mg total) by mouth every 6 (six) hours as needed. Patient not taking: Reported on 04/23/2016 05/04/15 05/03/16  Sable Feil, PA-C    Patient Active Problem List   Diagnosis Date Noted  . Personal history of tobacco use, presenting hazards to health 01/26/2016  . Quadriceps muscle rupture 05/04/2015  . Atherosclerosis of coronary artery 01/27/2015  . Clinical depression 01/27/2015  . Acid reflux 01/27/2015  . History of tobacco use 01/27/2015  . HLD  (hyperlipidemia) 01/27/2015  . BP (high blood pressure) 01/27/2015  . Fatty tumor 01/27/2015  . Obstructive sleep apnea 01/27/2015    Past Medical History:  Diagnosis Date  . Coronary artery disease   . Depression   . GERD (gastroesophageal reflux disease)   . H/O elevated lipids   . Hypertension   . Personal history of tobacco use, presenting hazards to health 01/26/2016  . Sleep apnea     Social History   Social History  . Marital status: Married    Spouse name: [name]  . Number of children: 2  . Years of education: 96   Occupational History  . retired    Social History Main Topics  . Smoking status: Former Smoker    Packs/day: 1.00    Years: 30.00    Types: Cigarettes    Quit date: 10/26/2011  . Smokeless tobacco: Never Used  . Alcohol use 0.6 oz/week    1 Cans of beer per week     Comment: ocassionally-beer  . Drug use: No  . Sexual activity: No   Other Topics Concern  . Not on file   Social History Narrative  . No narrative on file    No Known Allergies  Review of Systems  Constitutional: Negative.   Respiratory: Negative.   Cardiovascular: Negative.   Musculoskeletal: Positive for joint pain.    Immunization History  Administered Date(s)  Administered  . Tdap 08/19/2007  . Zoster 02/07/2012   Objective:  BP (!) 132/98   Pulse 72   Temp 98.2 F (36.8 C)   Resp 14   Wt 231 lb (104.8 kg)   BMI 34.11 kg/m   Physical Exam  Constitutional: He is oriented to person, place, and time and well-developed, well-nourished, and in no distress.  HENT:  Head: Normocephalic and atraumatic.  Right Ear: External ear normal.  Left Ear: External ear normal.  Nose: Nose normal.  Eyes: Conjunctivae are normal. Pupils are equal, round, and reactive to light.  Neck: Normal range of motion. Neck supple.  Cardiovascular: Normal rate, regular rhythm, normal heart sounds and intact distal pulses.   No murmur heard. Pulmonary/Chest: Effort normal and breath  sounds normal. No respiratory distress. He has no wheezes.  Musculoskeletal: He exhibits tenderness.  Tender over the medial joint line on the right. Mild Bakers cyst on the right.  Neurological: He is alert and oriented to person, place, and time.  Skin: Skin is warm and dry.  Psychiatric: Mood, memory, affect and judgment normal.    Lab Results  Component Value Date   WBC 5.5 12/28/2015   HGB 13.1 05/06/2015   HCT 41.1 12/28/2015   PLT 158 12/28/2015   GLUCOSE 99 12/28/2015   CHOL 153 12/28/2015   TRIG 53 12/28/2015   HDL 37 (L) 12/28/2015   LDLCALC 105 (H) 12/28/2015   TSH 1.910 12/28/2015   PSA 0.5 03/11/2013   INR 1.37 05/04/2015    CMP     Component Value Date/Time   NA 142 12/28/2015 0822   K 4.6 12/28/2015 0822   CL 102 12/28/2015 0822   CO2 24 12/28/2015 0822   GLUCOSE 99 12/28/2015 0822   GLUCOSE 92 05/04/2015 1608   BUN 16 12/28/2015 0822   CREATININE 0.92 12/28/2015 0822   CALCIUM 9.2 12/28/2015 0822   PROT 6.4 12/28/2015 0822   ALBUMIN 4.2 12/28/2015 0822   AST 23 12/28/2015 0822   ALT 20 12/28/2015 0822   ALKPHOS 85 12/28/2015 0822   BILITOT 0.8 12/28/2015 0822   GFRNONAA 86 12/28/2015 0822   GFRAA 100 12/28/2015 0822    Assessment and Plan :  1. Bursitis of right knee New. Will treat with Naproxen and he can also use Voltaren gel as needed. Get Xray. Follow pending results. May need to refer to orthopedic will follow. - naproxen (NAPROSYN) 500 MG tablet; Take 1 tablet (500 mg total) by mouth 2 (two) times daily with a meal.  Dispense: 60 tablet; Refill: 0 - DG Knee Complete 4 Views Right; Future  2. Essential hypertension   3. Obstructive sleep apnea  Patient was seen and examined by Dr. Eulas Post and note was scribed by Theressa Millard, RMA.  I have done the exam and reviewed the above chart and it is accurate to the best of my knowledge.  Miguel Aschoff MD [facility] 04/23/2016  9:26 AM

## 2016-06-11 ENCOUNTER — Ambulatory Visit (INDEPENDENT_AMBULATORY_CARE_PROVIDER_SITE_OTHER): Payer: Medicare Other | Admitting: Family Medicine

## 2016-06-11 VITALS — BP 110/80 | HR 60 | Temp 97.8°F | Resp 16 | Wt 236.0 lb

## 2016-06-11 DIAGNOSIS — Z23 Encounter for immunization: Secondary | ICD-10-CM | POA: Diagnosis not present

## 2016-06-11 DIAGNOSIS — J3089 Other allergic rhinitis: Secondary | ICD-10-CM | POA: Diagnosis not present

## 2016-06-11 MED ORDER — CETIRIZINE HCL 10 MG PO TABS: 10.0000 mg | ORAL_TABLET | Freq: Every day | ORAL | 11 refills | Status: DC

## 2016-06-11 NOTE — Progress Notes (Signed)
Louis Luna  MRN: [id number] DOB: March 04, 1951  Subjective:  HPI   The patient is a 65 year old male who presents for evaluation of a right inguinal hernia.  He states he had trouble about 1 year ago and was in the process of seeing a surgeon when he had to have surgery on his leg and put the hernia treatment on home.  He states that it does not protrude out but he does get a pulling or burning feeling at the site.   Patient Active Problem List   Diagnosis Date Noted  . Personal history of tobacco use, presenting hazards to health 01/26/2016  . Quadriceps muscle rupture 05/04/2015  . Atherosclerosis of coronary artery 01/27/2015  . Clinical depression 01/27/2015  . Acid reflux 01/27/2015  . History of tobacco use 01/27/2015  . HLD (hyperlipidemia) 01/27/2015  . BP (high blood pressure) 01/27/2015  . Fatty tumor 01/27/2015  . Obstructive sleep apnea 01/27/2015    Past Medical History:  Diagnosis Date  . Coronary artery disease   . Depression   . GERD (gastroesophageal reflux disease)   . H/O elevated lipids   . Hypertension   . Personal history of tobacco use, presenting hazards to health 01/26/2016  . Sleep apnea     Social History   Social History  . Marital status: Married    Spouse name: [name]  . Number of children: 2  . Years of education: 69   Occupational History  . retired    Social History Main Topics  . Smoking status: Former Smoker    Packs/day: 1.00    Years: 30.00    Types: Cigarettes    Quit date: 10/26/2011  . Smokeless tobacco: Never Used  . Alcohol use 0.6 oz/week    1 Cans of beer per week     Comment: ocassionally-beer  . Drug use: No  . Sexual activity: No   Other Topics Concern  . Not on file   Social History Narrative  . No narrative on file    Outpatient Encounter Prescriptions as of 06/11/2016  Medication Sig  . aspirin EC 81 MG tablet Take 81 mg by mouth daily.  Marland Kitchen atenolol (TENORMIN) 50 MG tablet Take 1 tablet (50 mg  total) by mouth daily.  . diazepam (VALIUM) 5 MG tablet Take 1 tablet (5 mg total) by mouth daily as needed for anxiety.  Marland Kitchen ibuprofen (ADVIL,MOTRIN) 800 MG tablet Take 1 tablet (800 mg total) by mouth every 8 (eight) hours as needed for moderate pain.  . Multiple Vitamin (MULTIVITAMIN WITH MINERALS) TABS tablet Take 1 tablet by mouth daily.  . naproxen (NAPROSYN) 500 MG tablet Take 1 tablet (500 mg total) by mouth 2 (two) times daily with a meal.  . psyllium (METAMUCIL SMOOTH TEXTURE) 28 % packet Take 1 packet by mouth daily.  . sertraline (ZOLOFT) 50 MG tablet Take 1 tablet (50 mg total) by mouth daily.   No facility-administered encounter medications on file as of 06/11/2016.     No Known Allergies  Review of Systems  Constitutional: Negative for chills, fever and malaise/fatigue.  Respiratory: Negative for cough, shortness of breath and wheezing.   Cardiovascular: Negative for chest pain, palpitations, orthopnea and leg swelling.  Gastrointestinal: Negative for abdominal pain, blood in stool, constipation, diarrhea, heartburn, melena, nausea and vomiting.  Neurological: Negative for dizziness, weakness and headaches.   Objective:  BP 110/80 (BP Location: Right Arm, Patient Position: Sitting, Cuff Size: Normal)   Pulse 60  Temp 97.8 F (36.6 C) (Oral)   Resp 16   Wt 236 lb (107 kg)   BMI 34.85 kg/m   Physical Exam  Constitutional: He is oriented to person, place, and time and well-developed, well-nourished, and in no distress.  HENT:  Head: Normocephalic.  Eyes: Pupils are equal, round, and reactive to light.  Neck: Normal range of motion.  Cardiovascular: Normal rate, regular rhythm, normal heart sounds and intact distal pulses.   Pulmonary/Chest: Effort normal and breath sounds normal.  Abdominal: Soft.  Genitourinary: Penis normal.  Genitourinary Comments: Last year we were able to find a hernia. This year there is no discernible hernia that I can find on 3 different  exams. The right lower quadrant/right pelvic area is area of tenderness in the inguinal canal exam is normal.  Neurological: He is alert and oriented to person, place, and time.  Skin: Skin is warm and dry.  He shaved his pubic hair in the region that is tender areas no rash in this area.  Psychiatric: Mood, memory, affect and judgment normal.    Assessment and Plan :  Right inguinal hernia I do think the patient has a small hernias this was present last year and has not been repaired. Will refer back to Dr. Tamala Julian at any point in time. Patient wishes to follow this for now. Watch for any rash or any changes in the symptoms Hypertension  I have done the exam and reviewed the chart and it is accurate to the best of my knowledge. Miguel Aschoff M.D. [facility]

## 2016-07-12 ENCOUNTER — Other Ambulatory Visit: Payer: Self-pay | Admitting: Family Medicine

## 2016-07-16 DIAGNOSIS — G4733 Obstructive sleep apnea (adult) (pediatric): Secondary | ICD-10-CM | POA: Diagnosis not present

## 2016-07-16 DIAGNOSIS — G478 Other sleep disorders: Secondary | ICD-10-CM | POA: Diagnosis not present

## 2016-07-31 DIAGNOSIS — Z23 Encounter for immunization: Secondary | ICD-10-CM | POA: Diagnosis not present

## 2016-08-14 DIAGNOSIS — H43391 Other vitreous opacities, right eye: Secondary | ICD-10-CM | POA: Diagnosis not present

## 2016-10-10 ENCOUNTER — Other Ambulatory Visit: Payer: Self-pay

## 2016-10-10 MED ORDER — OSELTAMIVIR PHOSPHATE 75 MG PO CAPS: 75.0000 mg | ORAL_CAPSULE | Freq: Every day | ORAL | 0 refills | Status: DC

## 2016-12-11 ENCOUNTER — Other Ambulatory Visit: Payer: Self-pay | Admitting: Family Medicine

## 2017-01-11 ENCOUNTER — Other Ambulatory Visit: Payer: Self-pay | Admitting: Family Medicine

## 2017-01-11 DIAGNOSIS — F329 Major depressive disorder, single episode, unspecified: Secondary | ICD-10-CM

## 2017-01-11 DIAGNOSIS — F32A Depression, unspecified: Secondary | ICD-10-CM

## 2017-01-14 ENCOUNTER — Telehealth: Payer: Self-pay | Admitting: Family Medicine

## 2017-01-14 ENCOUNTER — Other Ambulatory Visit: Payer: Self-pay | Admitting: Family Medicine

## 2017-01-14 ENCOUNTER — Ambulatory Visit
Admission: RE | Admit: 2017-01-14 | Discharge: 2017-01-14 | Disposition: A | Discharge status: Home / Self Care | Payer: Medicare Other | Source: Ambulatory Visit | Attending: Family Medicine | Admitting: Family Medicine

## 2017-01-14 ENCOUNTER — Ambulatory Visit (INDEPENDENT_AMBULATORY_CARE_PROVIDER_SITE_OTHER): Payer: Medicare Other | Admitting: Family Medicine

## 2017-01-14 ENCOUNTER — Encounter: Payer: Self-pay | Admitting: Family Medicine

## 2017-01-14 VITALS — BP 122/78 | HR 86 | Temp 98.6°F | Resp 17 | Wt 231.4 lb

## 2017-01-14 DIAGNOSIS — R059 Cough, unspecified: Secondary | ICD-10-CM

## 2017-01-14 DIAGNOSIS — R918 Other nonspecific abnormal finding of lung field: Secondary | ICD-10-CM | POA: Insufficient documentation

## 2017-01-14 DIAGNOSIS — R05 Cough: Secondary | ICD-10-CM | POA: Diagnosis not present

## 2017-01-14 MED ORDER — CEFDINIR 300 MG PO CAPS: 300.0000 mg | ORAL_CAPSULE | Freq: Two times a day (BID) | ORAL | 0 refills | Status: DC

## 2017-01-14 NOTE — Patient Instructions (Addendum)
We will call you with the x-ray report. Continue Mucinex DM.

## 2017-01-14 NOTE — Progress Notes (Signed)
Subjective:     Patient ID: Louis Luna, male   DOB: 06-20-1951, 66 y.o.   MRN: [id number]  HPI  Chief Complaint  Patient presents with  . Cough    Patient comes in office today with concerns of cough and congestion for the past 3-4days. Patient reports that day before symptoms began he had given blood and mowed lawn later that day. Associated with cough patient complains of sinus headache, wheezing and chills in the PM. Patient has been taking otc Mucinex D for relief.   Denies sore throat or significant sinus congestion. Reports significant chills but has not taken his temperature at home.   Review of Systems     Objective:   Physical Exam  Constitutional: He appears well-developed and well-nourished. No distress.  Ears: T.M's intact without inflammation Throat: no tonsillar enlargement or exudate Neck: no cervical adenopathy Lungs: Bilateral basilar crackles and end expiratory wheezes.     Assessment:    1. Cough: ? pneumonia - DG Chest 2 View; Future    Plan:    Further f/u pending x-ray report.

## 2017-01-14 NOTE — Telephone Encounter (Signed)
Error/MW °

## 2017-01-24 ENCOUNTER — Encounter: Payer: Self-pay | Admitting: Family Medicine

## 2017-01-24 ENCOUNTER — Ambulatory Visit (INDEPENDENT_AMBULATORY_CARE_PROVIDER_SITE_OTHER): Payer: Medicare Other | Admitting: Family Medicine

## 2017-01-24 ENCOUNTER — Telehealth: Payer: Self-pay | Admitting: *Deleted

## 2017-01-24 VITALS — BP 120/84 | HR 71 | Temp 98.4°F | Resp 16 | Wt 231.2 lb

## 2017-01-24 DIAGNOSIS — J180 Bronchopneumonia, unspecified organism: Secondary | ICD-10-CM | POA: Diagnosis not present

## 2017-01-24 DIAGNOSIS — R059 Cough, unspecified: Secondary | ICD-10-CM

## 2017-01-24 DIAGNOSIS — R05 Cough: Secondary | ICD-10-CM | POA: Diagnosis not present

## 2017-01-24 NOTE — Progress Notes (Signed)
Subjective:     Patient ID: Louis Luna, male   DOB: 05/19/1951, 66 y.o.   MRN: [id number]  HPI  Chief Complaint  Patient presents with  . Cough    Patient returns to office today for follow up, last office visit was 01/14/17 patient was seen for cough for 72hrs. Chest x-ray showed possible left lower lobe bronchopneumonia, patient states that his cough is improved and he denies shortness of breath or wheezing.   States he feels better and completed 10 day course of cefdinir.   Review of Systems     Objective:   Physical Exam  Constitutional: He appears well-developed and well-nourished. No distress.  Pulmonary/Chest: Breath sounds normal. He has no wheezes.       Assessment:    1. Cough: resolved  2. Bronchopneumonia: improved-f/u CXR In 3 weeks    Plan:    Further f/u pending x-ray results.

## 2017-01-24 NOTE — Telephone Encounter (Signed)
Notified patient that annual lung cancer screening low dose CT scan is due currently or will be in near future. Discussed recent diagnosis with pneumonia and planned xray in 3-4 weeks. Will call back in approximately 1 month for consideration of lung screening scan at that time.

## 2017-01-24 NOTE — Patient Instructions (Signed)
Call for follow up chest x-ray in 3 weeks

## 2017-02-25 IMAGING — CT CT CHEST LUNG CANCER SCREENING LOW DOSE W/O CM
1 of 2 series · 15 of 32 positions shown, 19 images · non-contrast
Comparison: 08/29/2005.

CLINICAL DATA: Former smoker, quit 4 years ago, 30 pack-year
history, lung cancer screening.

EXAM:
CT CHEST WITHOUT CONTRAST LOW-DOSE FOR LUNG CANCER SCREENING
TECHNIQUE: Multidetector CT imaging of the chest was performed following the
standard protocol without IV contrast.

[Series 3: lungs · axial · 0.84mm/px · z∈[-742,-442]mm · 15 of 330 slices shown, 19 images]
[im 15/330  mediastinal]
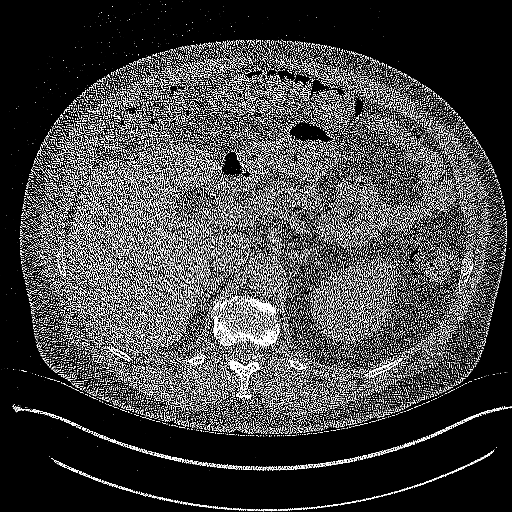
[im 15/330  lung]
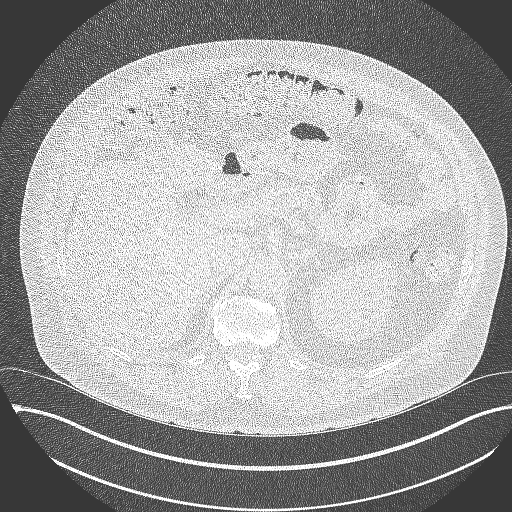
[im 45/330  lung]
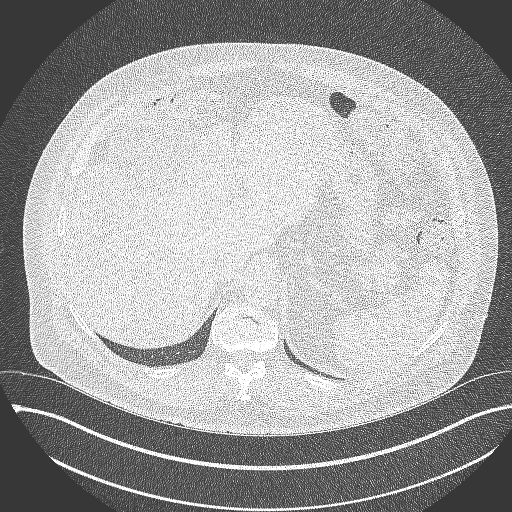
[im 75/330  lung]
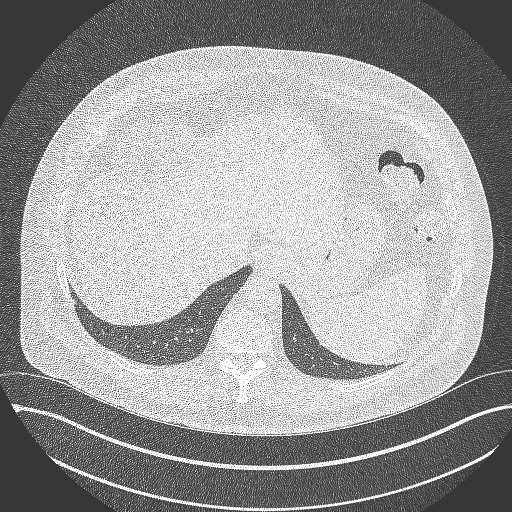
[im 83/330  lung]
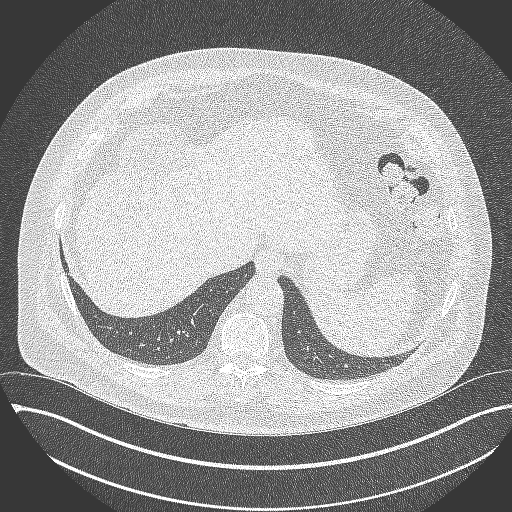
[im 105/330  mediastinal]
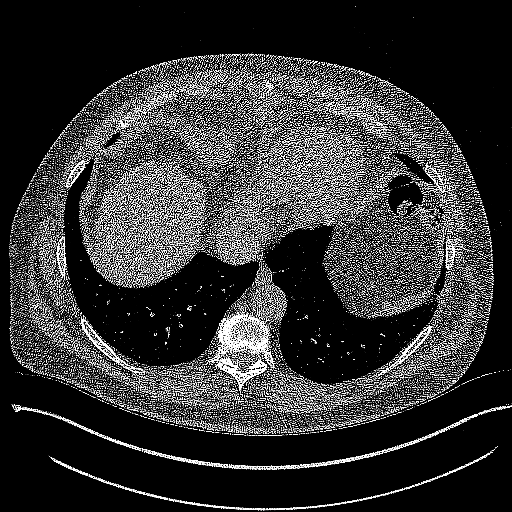
[im 105/330  lung]
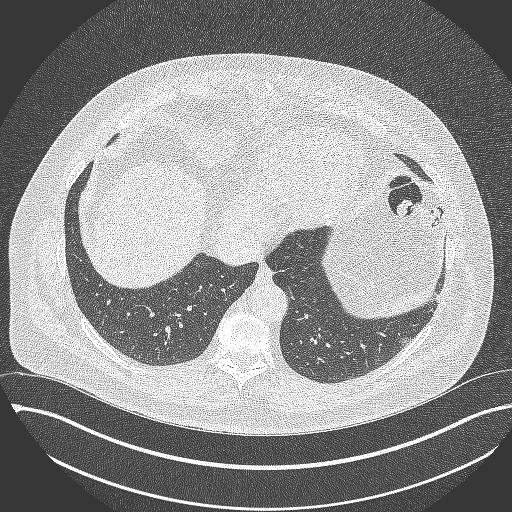
[im 120/330  lung]
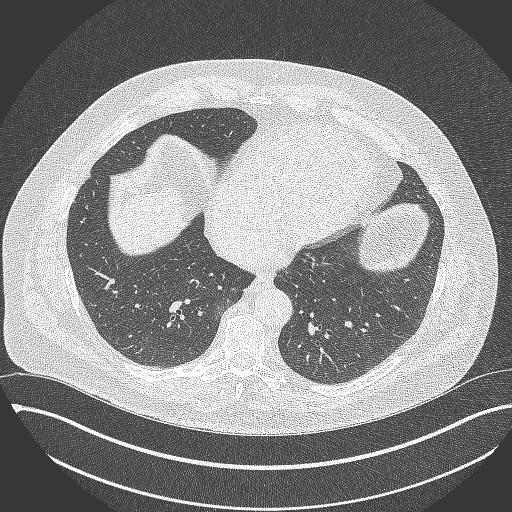
[im 150/330  lung]
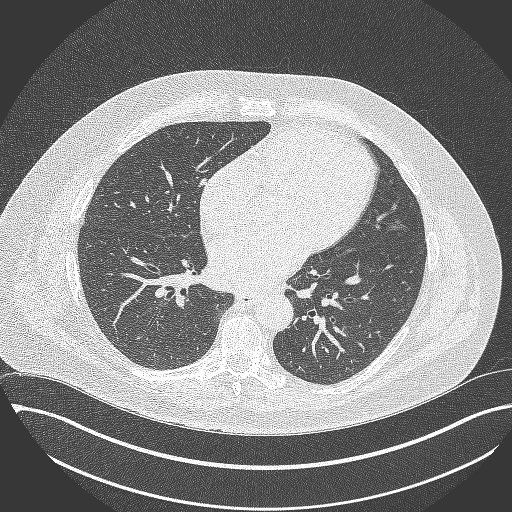
[im 165/330  lung]
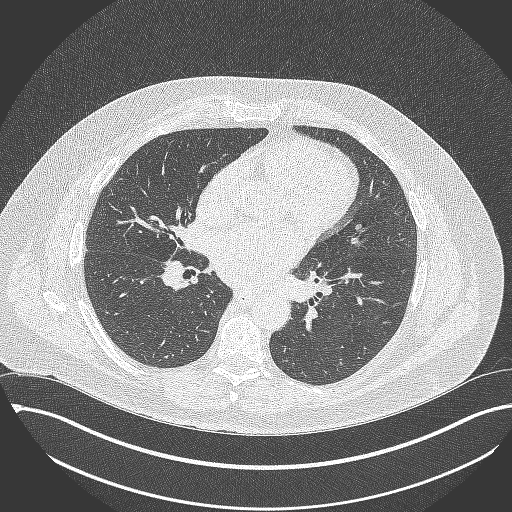
[im 180/330  mediastinal]
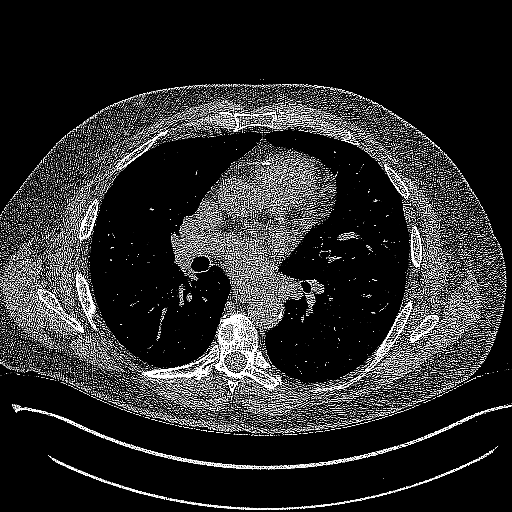
[im 180/330  lung]
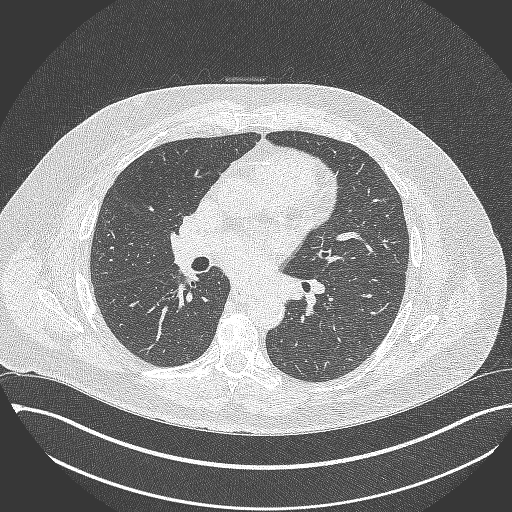
[im 210/330  lung]
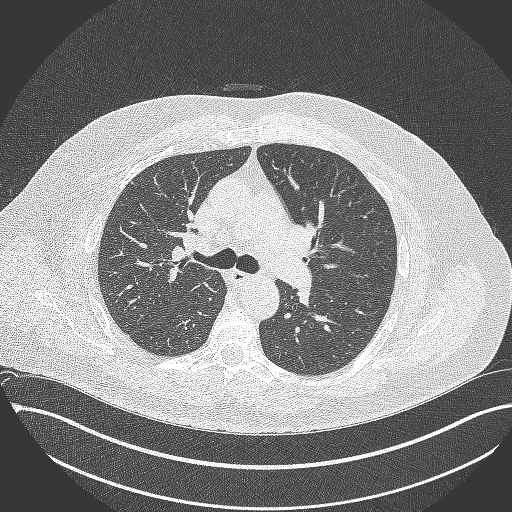
[im 225/330  lung]
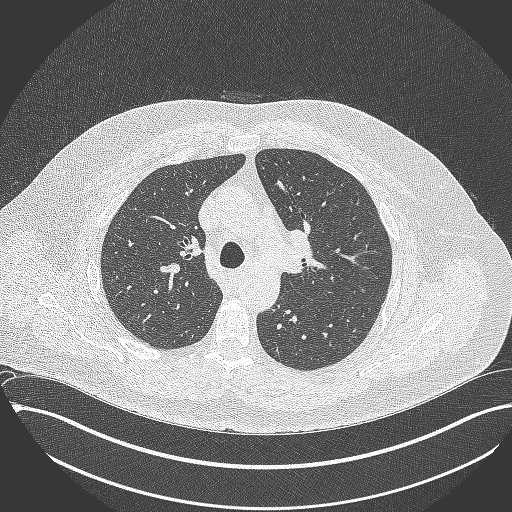
[im 247/330  lung]
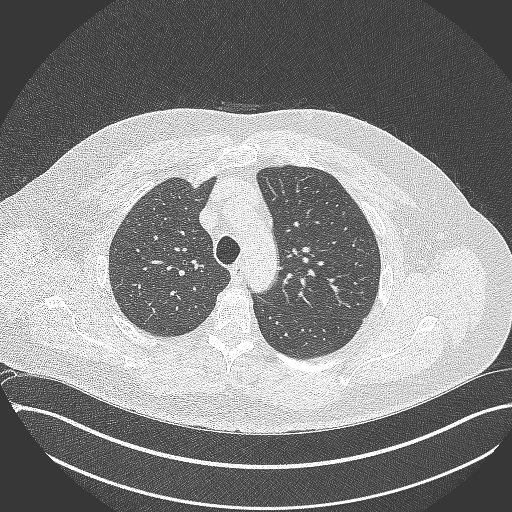
[im 270/330  mediastinal]
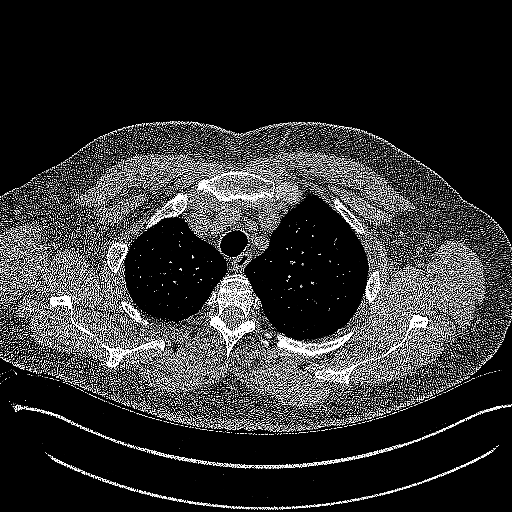
[im 270/330  lung]
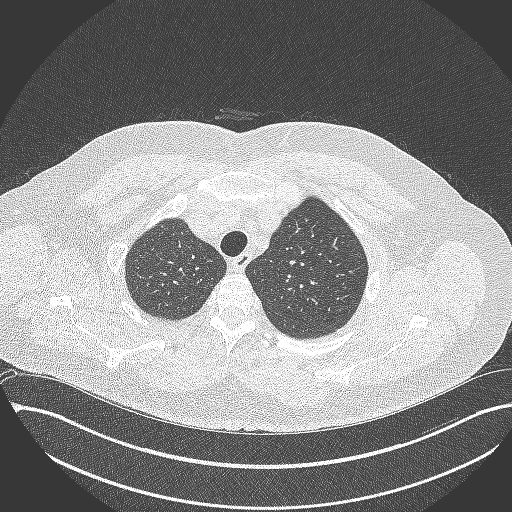
[im 285/330  lung]
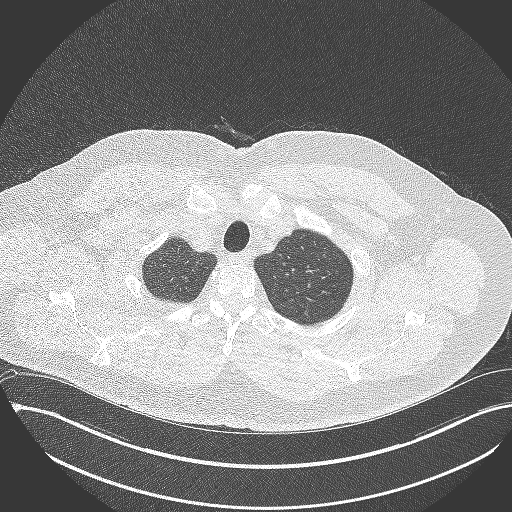
[im 315/330  lung]
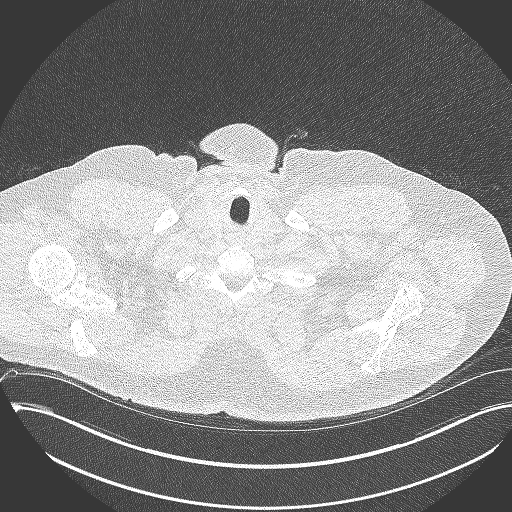

[15 of 32 positions shown; findings below may reference images not displayed]

FINDINGS: Mediastinum/Nodes: Mediastinal lymph nodes are not enlarged by CT
size criteria. Hilar regions are difficult to definitively evaluate
without IV contrast but appear grossly unremarkable. No axillary
adenopathy. Coronary artery calcification. Mild pericardial
calcification. Heart size normal. No pericardial or pleural
effusion.

Lungs/Pleura: No worrisome pulmonary nodules. Ground-glass scarring
is seen in the medial right lower lobe, adjacent to the spine. No
pleural fluid. Airway is unremarkable.

Upper abdomen: Low-attenuation lesion in segment 4 of the liver
measures 8.2 cm, increased from 4.5 cm. Visualized portions of the
liver, adrenal glands, left kidney, spleen, pancreas, stomach and
bowel are otherwise grossly unremarkable. No upper abdominal
adenopathy.

Musculoskeletal: No worrisome lytic or sclerotic lesions.
Degenerative changes are seen in the shoulders bilaterally.
IMPRESSION: 1. Lung-RADS Category 1, negative. Continue annual screening with
low-dose chest CT without contrast in 12 months.
2. Coronary artery calcification.
3. Enlarging low-attenuation lesion in the liver, likely a cyst
although definitive characterization is limited without
post-contrast imaging.

## 2017-03-07 ENCOUNTER — Telehealth: Payer: Self-pay | Admitting: *Deleted

## 2017-03-07 DIAGNOSIS — Z87891 Personal history of nicotine dependence: Secondary | ICD-10-CM

## 2017-03-07 NOTE — Telephone Encounter (Signed)
Notified patient that annual lung cancer screening low dose CT scan is due currently or will be in near future. Confirmed that patient is within the age range of 55-77, and asymptomatic, (no signs or symptoms of lung cancer). Patient denies illness that would prevent curative treatment for lung cancer if found. Verified smoking history, (former, quit 10/26/11. 30 pack year). The shared decision making visit was done 01/27/16. Patient is agreeable for CT scan being scheduled.

## 2017-03-13 ENCOUNTER — Ambulatory Visit
Admission: RE | Admit: 2017-03-13 | Discharge: 2017-03-13 | Disposition: A | Discharge status: Home / Self Care | Payer: Medicare Other | Source: Ambulatory Visit | Attending: Oncology | Admitting: Oncology

## 2017-03-13 ENCOUNTER — Ambulatory Visit (INDEPENDENT_AMBULATORY_CARE_PROVIDER_SITE_OTHER): Payer: Medicare Other

## 2017-03-13 VITALS — BP 144/82 | HR 60 | Temp 97.7°F | Ht 69.0 in | Wt 235.6 lb

## 2017-03-13 DIAGNOSIS — Z0000 Encounter for general adult medical examination without abnormal findings: Secondary | ICD-10-CM

## 2017-03-13 DIAGNOSIS — Z122 Encounter for screening for malignant neoplasm of respiratory organs: Secondary | ICD-10-CM | POA: Insufficient documentation

## 2017-03-13 DIAGNOSIS — Z87891 Personal history of nicotine dependence: Secondary | ICD-10-CM | POA: Diagnosis not present

## 2017-03-13 DIAGNOSIS — I700 Atherosclerosis of aorta: Secondary | ICD-10-CM | POA: Diagnosis not present

## 2017-03-13 DIAGNOSIS — Z23 Encounter for immunization: Secondary | ICD-10-CM

## 2017-03-13 NOTE — Progress Notes (Signed)
Subjective:   Louis Luna is a 66 y.o. male who presents for Medicare Annual/Subsequent preventive examination.  Review of Systems:  N/A  Cardiac Risk Factors include: advanced age (>86men, >35 women);dyslipidemia;hypertension;male gender;obesity (BMI >30kg/m2)     Objective:    Vitals: BP (!) 144/82 (BP Location: Left Arm)   Pulse 60   Temp 97.7 F (36.5 C) (Oral)   Ht 5\' 9"  (1.753 m)   Wt 235 lb 9.6 oz (106.9 kg)   BMI 34.79 kg/m   Body mass index is 34.79 kg/m.  Tobacco History  Smoking Status  . Former Smoker  . Packs/day: 1.00  . Years: 30.00  . Types: Cigarettes  . Quit date: 10/26/2011  Smokeless Tobacco  . Never Used     Counseling given: Not Answered   Past Medical History:  Diagnosis Date  . Coronary artery disease   . Depression   . GERD (gastroesophageal reflux disease)   . H/O elevated lipids   . Hypertension   . Personal history of tobacco use, presenting hazards to health 01/26/2016  . Sleep apnea    Past Surgical History:  Procedure Laterality Date  . ANKLE SURGERY    . APPENDECTOMY    . COLONOSCOPY N/A 03/23/2016   Procedure: COLONOSCOPY;  Surgeon: Manya Silvas, MD;  Location: [facility] ENDOSCOPY;  Service: Endoscopy;  Laterality: N/A;  . HERNIA REPAIR     inguinal-left 04/2009  . QUADRICEPS TENDON REPAIR Left 05/05/2015   Procedure: REPAIR QUADRICEP TENDON;  Surgeon: Hessie Knows, MD;  Location: [facility] ORS;  Service: Orthopedics;  Laterality: Left;  . TUMOR EXCISION     from Rt side of the neck-benign   Family History  Problem Relation Age of Onset  . Diabetes Mother   . Hypertension Mother   . Breast cancer Mother   . Alcohol abuse Father   . Hypertension Father   . Alzheimer's disease Father   . Alcohol abuse Brother   . Hypertension Brother   . Breast cancer Sister   . Melanoma Sister   . Depression Sister   . Breast cancer Sister   . Depression Sister   . Hypertension Brother    History  Sexual Activity  . Sexual  activity: No    Outpatient Encounter Prescriptions as of 03/13/2017  Medication Sig  . aspirin EC 81 MG tablet Take 81 mg by mouth daily.  Marland Kitchen atenolol (TENORMIN) 50 MG tablet Take 1 tablet (50 mg total) by mouth daily.  . cetirizine (ZYRTEC) 10 MG tablet Take 1 tablet (10 mg total) by mouth daily.  . diazepam (VALIUM) 5 MG tablet take 1 tablet by mouth once daily if needed for anxiety  . glucosamine-chondroitin 500-400 MG tablet Take 1 tablet by mouth daily.  Marland Kitchen ibuprofen (ADVIL,MOTRIN) 800 MG tablet Take 1 tablet (800 mg total) by mouth every 8 (eight) hours as needed for moderate pain.  . Multiple Vitamin (MULTIVITAMIN WITH MINERALS) TABS tablet Take 1 tablet by mouth daily.  . psyllium (METAMUCIL SMOOTH TEXTURE) 28 % packet Take 1 packet by mouth daily.  . saw palmetto 80 MG capsule Take 80 mg by mouth 2 (two) times daily.  . sertraline (ZOLOFT) 50 MG tablet take 1 tablet by mouth once daily   No facility-administered encounter medications on file as of 03/13/2017.     Activities of Daily Living In your present state of health, do you have any difficulty performing the following activities: 03/13/2017  Hearing? N  Vision? N  Difficulty concentrating or  making decisions? N  Walking or climbing stairs? N  Dressing or bathing? N  Doing errands, shopping? N  Preparing Food and eating ? N  Using the Toilet? N  In the past six months, have you accidently leaked urine? N  Do you have problems with loss of bowel control? N  Managing your Medications? N  Managing your Finances? N  Housekeeping or managing your Housekeeping? N  Some recent data might be hidden    Patient Care Team: Jerrol Banana., MD as PCP - General (Family Medicine) Dingeldein, Remo Lipps, MD as Consulting Physician (Ophthalmology)   Assessment:     Exercise Activities and Dietary recommendations Current Exercise Habits: Structured exercise class (and yardwork), Type of exercise: walking;Other - see  comments;strength training/weights (eliptical), Time (Minutes): > 60, Frequency (Times/Week): 3, Weekly Exercise (Minutes/Week): 0, Intensity: Moderate  Goals    . Exercise 150 minutes per week (moderate activity)          Continue exercising for over 150 minutes a week.       Fall Risk Fall Risk  03/13/2017 12/22/2015  Falls in the past year? No Yes  Number falls in past yr: - 1  Injury with Fall? - Yes   Depression Screen PHQ 2/9 Scores 03/13/2017 03/13/2017 12/22/2015  PHQ - 2 Score 0 0 0  PHQ- 9 Score 3 - -    Cognitive Function     6CIT Screen 03/13/2017  What Year? 0 points  What month? 0 points  What time? 0 points  Count back from 20 0 points  Months in reverse 0 points  Repeat phrase 0 points  Total Score 0    Immunization History  Administered Date(s) Administered  . Influenza Split 06/28/2011, 07/14/2012  . Influenza, High Dose Seasonal PF 06/11/2016  . Pneumococcal Conjugate-13 03/13/2017  . Tdap 08/19/2007  . Zoster 02/07/2012   Screening Tests Health Maintenance  Topic Date Due  . INFLUENZA VACCINE  03/27/2017  . TETANUS/TDAP  08/18/2017  . PNA vac Low Risk Adult (2 of 2 - PPSV23) 03/13/2018  . COLONOSCOPY  03/23/2026  . Hepatitis C Screening  Completed      Plan:  I have personally reviewed and addressed the Medicare Annual Wellness questionnaire and have noted the following in the patient's chart:  A. Medical and social history B. Use of alcohol, tobacco or illicit drugs  C. Current medications and supplements D. Functional ability and status E.  Nutritional status F.  Physical activity G. Advance directives H. List of other physicians I.  Hospitalizations, surgeries, and ER visits in previous 12 months J.  [place] such as hearing and vision if needed, cognitive and depression L. Referrals and appointments - none  In addition, I have reviewed and discussed with patient certain preventive protocols, quality metrics, and best  practice recommendations. A written personalized care plan for preventive services as well as general preventive health recommendations were provided to patient.  See attached scanned questionnaire for additional information.   Signed,  Fabio Neighbors, LPN Nurse Health Advisor   MD Recommendations: None

## 2017-03-13 NOTE — Patient Instructions (Signed)
Mr. Louis Luna , Thank you for taking time to come for your Medicare Wellness Visit. I appreciate your ongoing commitment to your health goals. Please review the following plan we discussed and let me know if I can assist you in the future.   Screening recommendations/referrals: Colonoscopy: completed 03/23/16, due 02/2026 Recommended yearly ophthalmology/optometry visit for glaucoma screening and checkup Recommended yearly dental visit for hygiene and checkup  Vaccinations: Influenza vaccine: due 04/2017 Pneumococcal vaccine: Prevnar 13 given today Tdap vaccine: completed 08/19/07, due 07/2017 Shingles vaccine: completed 02/07/12  Advanced directives: Please bring a copy of your POA (Power of [place]) and/or Living Will to your next appointment.   Conditions/risks identified: Continue exercising for over 150 minutes a week.   Next appointment: 03/21/17 @ 8:30 AM  Preventive Care 67 Years and Older, Male Preventive care refers to lifestyle choices and visits with your health care provider that can promote health and wellness. What does preventive care include?  A yearly physical exam. This is also called an annual well check.  Dental exams once or twice a year.  Routine eye exams. Ask your health care provider how often you should have your eyes checked.  Personal lifestyle choices, including:  Daily care of your teeth and gums.  Regular physical activity.  Eating a healthy diet.  Avoiding tobacco and drug use.  Limiting alcohol use.  Practicing safe sex.  Taking low doses of aspirin every day.  Taking vitamin and mineral supplements as recommended by your health care provider. What happens during an annual well check? The services and screenings done by your health care provider during your annual well check will depend on your age, overall health, lifestyle risk factors, and family history of disease. Counseling  Your health care provider may ask you questions about  your:  Alcohol use.  Tobacco use.  Drug use.  Emotional well-being.  Home and relationship well-being.  Sexual activity.  Eating habits.  History of falls.  Memory and ability to understand (cognition).  Work and work Statistician. Screening  You may have the following tests or measurements:  Height, weight, and BMI.  Blood pressure.  Lipid and cholesterol levels. These may be checked every 5 years, or more frequently if you are over 35 years old.  Skin check.  Lung cancer screening. You may have this screening every year starting at age 12 if you have a 30-pack-year history of smoking and currently smoke or have quit within the past 15 years.  Fecal occult blood test (FOBT) of the stool. You may have this test every year starting at age 66.  Flexible sigmoidoscopy or colonoscopy. You may have a sigmoidoscopy every 5 years or a colonoscopy every 10 years starting at age 50.  Prostate cancer screening. Recommendations will vary depending on your family history and other risks.  Hepatitis C blood test.  Hepatitis B blood test.  Sexually transmitted disease (STD) testing.  Diabetes screening. This is done by checking your blood sugar (glucose) after you have not eaten for a while (fasting). You may have this done every 1-3 years.  Abdominal aortic aneurysm (AAA) screening. You may need this if you are a current or former smoker.  Osteoporosis. You may be screened starting at age 17 if you are at high risk. Talk with your health care provider about your test results, treatment options, and if necessary, the need for more tests. Vaccines  Your health care provider may recommend certain vaccines, such as:  Influenza vaccine. This is recommended every  year.  Tetanus, diphtheria, and acellular pertussis (Tdap, Td) vaccine. You may need a Td booster every 10 years.  Zoster vaccine. You may need this after age 5.  Pneumococcal 13-valent conjugate (PCV13) vaccine.  One dose is recommended after age 79.  Pneumococcal polysaccharide (PPSV23) vaccine. One dose is recommended after age 78. Talk to your health care provider about which screenings and vaccines you need and how often you need them. This information is not intended to replace advice given to you by your health care provider. Make sure you discuss any questions you have with your health care provider. Document Released: 09/09/2015 Document Revised: 05/02/2016 Document Reviewed: 06/14/2015 Elsevier Interactive Patient Education  2017 [place] Prevention in the Home Falls can cause injuries. They can happen to people of all ages. There are many things you can do to make your home safe and to help prevent falls. What can I do on the outside of my home?  Regularly fix the edges of walkways and driveways and fix any cracks.  Remove anything that might make you trip as you walk through a door, such as a raised step or threshold.  Trim any bushes or trees on the path to your home.  Use bright outdoor lighting.  Clear any walking paths of anything that might make someone trip, such as rocks or tools.  Regularly check to see if handrails are loose or broken. Make sure that both sides of any steps have handrails.  Any raised decks and porches should have guardrails on the edges.  Have any leaves, snow, or ice cleared regularly.  Use sand or salt on walking paths during winter.  Clean up any spills in your garage right away. This includes oil or grease spills. What can I do in the bathroom?  Use night lights.  Install grab bars by the toilet and in the tub and shower. Do not use towel bars as grab bars.  Use non-skid mats or decals in the tub or shower.  If you need to sit down in the shower, use a plastic, non-slip stool.  Keep the floor dry. Clean up any water that spills on the floor as soon as it happens.  Remove soap buildup in the tub or shower regularly.  Attach bath  mats securely with double-sided non-slip rug tape.  Do not have throw rugs and other things on the floor that can make you trip. What can I do in the bedroom?  Use night lights.  Make sure that you have a light by your bed that is easy to reach.  Do not use any sheets or blankets that are too big for your bed. They should not hang down onto the floor.  Have a firm chair that has side arms. You can use this for support while you get dressed.  Do not have throw rugs and other things on the floor that can make you trip. What can I do in the kitchen?  Clean up any spills right away.  Avoid walking on wet floors.  Keep items that you use a lot in easy-to-reach places.  If you need to reach something above you, use a strong step stool that has a grab bar.  Keep electrical cords out of the way.  Do not use floor polish or wax that makes floors slippery. If you must use wax, use non-skid floor wax.  Do not have throw rugs and other things on the floor that can make you trip. What can  I do with my stairs?  Do not leave any items on the stairs.  Make sure that there are handrails on both sides of the stairs and use them. Fix handrails that are broken or loose. Make sure that handrails are as long as the stairways.  Check any carpeting to make sure that it is firmly attached to the stairs. Fix any carpet that is loose or worn.  Avoid having throw rugs at the top or bottom of the stairs. If you do have throw rugs, attach them to the floor with carpet tape.  Make sure that you have a light switch at the top of the stairs and the bottom of the stairs. If you do not have them, ask someone to add them for you. What else can I do to help prevent falls?  Wear shoes that:  Do not have high heels.  Have rubber bottoms.  Are comfortable and fit you well.  Are closed at the toe. Do not wear sandals.  If you use a stepladder:  Make sure that it is fully opened. Do not climb a closed  stepladder.  Make sure that both sides of the stepladder are locked into place.  Ask someone to hold it for you, if possible.  Clearly mark and make sure that you can see:  Any grab bars or handrails.  First and last steps.  Where the edge of each step is.  Use tools that help you move around (mobility aids) if they are needed. These include:  Canes.  Walkers.  Scooters.  Crutches.  Turn on the lights when you go into a dark area. Replace any light bulbs as soon as they burn out.  Set up your furniture so you have a clear path. Avoid moving your furniture around.  If any of your floors are uneven, fix them.  If there are any pets around you, be aware of where they are.  Review your medicines with your doctor. Some medicines can make you feel dizzy. This can increase your chance of falling. Ask your doctor what other things that you can do to help prevent falls. This information is not intended to replace advice given to you by your health care provider. Make sure you discuss any questions you have with your health care provider. Document Released: 06/09/2009 Document Revised: 01/19/2016 Document Reviewed: 09/17/2014 Elsevier Interactive Patient Education  2017 [employer].

## 2017-03-15 ENCOUNTER — Encounter: Payer: Self-pay | Admitting: *Deleted

## 2017-03-21 ENCOUNTER — Ambulatory Visit (INDEPENDENT_AMBULATORY_CARE_PROVIDER_SITE_OTHER): Payer: Medicare Other | Admitting: Family Medicine

## 2017-03-21 ENCOUNTER — Encounter: Payer: Self-pay | Admitting: Family Medicine

## 2017-03-21 VITALS — BP 132/76 | HR 58 | Temp 97.8°F | Resp 14 | Wt 236.0 lb

## 2017-03-21 DIAGNOSIS — E7849 Other hyperlipidemia: Secondary | ICD-10-CM

## 2017-03-21 DIAGNOSIS — E784 Other hyperlipidemia: Secondary | ICD-10-CM | POA: Diagnosis not present

## 2017-03-21 DIAGNOSIS — Z1211 Encounter for screening for malignant neoplasm of colon: Secondary | ICD-10-CM

## 2017-03-21 DIAGNOSIS — Z0000 Encounter for general adult medical examination without abnormal findings: Secondary | ICD-10-CM | POA: Diagnosis not present

## 2017-03-21 LAB — POCT URINALYSIS DIPSTICK
Bilirubin, UA: NEGATIVE
Blood, UA: NEGATIVE
GLUCOSE UA: NEGATIVE
Ketones, UA: NEGATIVE
Leukocytes, UA: NEGATIVE
NITRITE UA: NEGATIVE
PROTEIN UA: NEGATIVE
Spec Grav, UA: 1.01 (ref 1.010–1.025)
UROBILINOGEN UA: 0.2 U/dL
pH, UA: 6 (ref 5.0–8.0)

## 2017-03-21 LAB — IFOBT (OCCULT BLOOD): IMMUNOLOGICAL FECAL OCCULT BLOOD TEST: NEGATIVE

## 2017-03-21 NOTE — Progress Notes (Signed)
Patient: Louis Luna, Male    DOB: Nov 16, 1950, 66 y.o.   MRN: [id number] Visit Date: 03/21/2017  Today's Provider: [name], MD   Chief Complaint  Patient presents with  . Annual Exam   Subjective:   Louis Luna is a 66 y.o. male who presents today for his Annual physical exam He feels well. He reports exercising walking 3 times a week. He reports he is sleeping well. Patient saw [facility] for Wellness Visit on 03/13/2017 Last colonoscopy was 03/23/16-diverticulosis, internal hemorrhoids, otherwise normal. Immunization History  Administered Date(s) Administered  . Influenza Split 06/28/2011, 07/14/2012  . Influenza, High Dose Seasonal PF 06/11/2016  . Pneumococcal Conjugate-13 03/13/2017  . Tdap 08/19/2007  . Zoster 02/07/2012   Review of Systems  Constitutional: Negative.   HENT: Negative.   Eyes: Negative.   Respiratory: Negative.   Cardiovascular: Negative.   Gastrointestinal: Negative.   Endocrine: Negative.   Genitourinary: Negative.   Musculoskeletal: Negative.   Skin: Negative.   Allergic/Immunologic: Negative.   Neurological: Negative.   Hematological: Negative.   Psychiatric/Behavioral: Negative.     Patient Active Problem List   Diagnosis Date Noted  . Personal history of tobacco use, presenting hazards to health 01/26/2016  . Quadriceps muscle rupture 05/04/2015  . Atherosclerosis of coronary artery 01/27/2015  . Clinical depression 01/27/2015  . Acid reflux 01/27/2015  . History of tobacco use 01/27/2015  . HLD (hyperlipidemia) 01/27/2015  . BP (high blood pressure) 01/27/2015  . Fatty tumor 01/27/2015  . Obstructive sleep apnea 01/27/2015    Social History   Social History  . Marital status: Married    Spouse name: [name]  . Number of children: 2  . Years of education: 56   Occupational History  . retired    Social History Main Topics  . Smoking status: Former Smoker    Packs/day: 1.00    Years: 30.00    Types:  Cigarettes    Quit date: 10/26/2011  . Smokeless tobacco: Never Used  . Alcohol use Yes     Comment: ocassionally-beer  . Drug use: No  . Sexual activity: No   Other Topics Concern  . Not on file   Social History Narrative  . No narrative on file    Past Surgical History:  Procedure Laterality Date  . ANKLE SURGERY    . APPENDECTOMY    . COLONOSCOPY N/A 03/23/2016   Procedure: COLONOSCOPY;  Surgeon: Manya Silvas, MD;  Location: [facility] ENDOSCOPY;  Service: Endoscopy;  Laterality: N/A;  . HERNIA REPAIR     inguinal-left 04/2009  . QUADRICEPS TENDON REPAIR Left 05/05/2015   Procedure: REPAIR QUADRICEP TENDON;  Surgeon: Hessie Knows, MD;  Location: [facility] ORS;  Service: Orthopedics;  Laterality: Left;  . TUMOR EXCISION     from Rt side of the neck-benign    His family history includes Alcohol abuse in his brother and father; Alzheimer's disease in his father; Breast cancer in his mother, sister, and sister; Depression in his sister and sister; Diabetes in his mother; Hypertension in his brother, brother, father, and mother; Melanoma in his sister.     Outpatient Encounter Prescriptions as of 03/21/2017  Medication Sig  . aspirin EC 81 MG tablet Take 81 mg by mouth daily.  Marland Kitchen atenolol (TENORMIN) 50 MG tablet Take 1 tablet (50 mg total) by mouth daily.  . diazepam (VALIUM) 5 MG tablet take 1 tablet by mouth once daily if needed for anxiety  . glucosamine-chondroitin 500-400 MG tablet Take 1  tablet by mouth daily.  Marland Kitchen ibuprofen (ADVIL,MOTRIN) 800 MG tablet Take 1 tablet (800 mg total) by mouth every 8 (eight) hours as needed for moderate pain.  . Multiple Vitamin (MULTIVITAMIN WITH MINERALS) TABS tablet Take 1 tablet by mouth daily.  . psyllium (METAMUCIL SMOOTH TEXTURE) 28 % packet Take 1 packet by mouth daily.  . saw palmetto 80 MG capsule Take 80 mg by mouth 2 (two) times daily.  . sertraline (ZOLOFT) 50 MG tablet take 1 tablet by mouth once daily  . [DISCONTINUED] cetirizine (ZYRTEC) 10  MG tablet Take 1 tablet (10 mg total) by mouth daily.   No facility-administered encounter medications on file as of 03/21/2017.     No Known Allergies  Patient Care Team: Jerrol Banana., MD as PCP - General (Family Medicine) Dingeldein, Remo Lipps, MD as Consulting Physician (Ophthalmology)   Objective:   Vitals:  Vitals:   03/21/17 0839  BP: 132/76  Pulse: (!) 58  Resp: 14  Temp: 97.8 F (36.6 C)  Weight: 236 lb (107 kg)    Physical Exam  Constitutional: He is oriented to person, place, and time. He appears well-developed and well-nourished.  HENT:  Head: Normocephalic and atraumatic.  Right Ear: External ear normal.  Left Ear: External ear normal.  Mouth/Throat: Oropharynx is clear and moist.  Eyes: Pupils are equal, round, and reactive to light. Conjunctivae are normal.  Neck: Normal range of motion. Neck supple.  Cardiovascular: Normal rate, regular rhythm, normal heart sounds and intact distal pulses.  Exam reveals no gallop.   No murmur heard. Pulmonary/Chest: Effort normal and breath sounds normal. No respiratory distress.  Abdominal: Soft. He exhibits no distension. There is no tenderness.  Genitourinary: Rectum normal, prostate normal and penis normal. Rectal exam shows guaiac negative stool. No penile tenderness.  Musculoskeletal: He exhibits no edema or tenderness.  Neurological: He is alert and oriented to person, place, and time.  Skin: Skin is warm and dry. No rash noted.  Psychiatric: He has a normal mood and affect. His behavior is normal. Judgment and thought content normal.    Activities of Daily Living In your present state of health, do you have any difficulty performing the following activities: 03/13/2017  Hearing? N  Vision? N  Difficulty concentrating or making decisions? N  Walking or climbing stairs? N  Dressing or bathing? N  Doing errands, shopping? N  Preparing Food and eating ? N  Using the Toilet? N  In the past six months, have  you accidently leaked urine? N  Do you have problems with loss of bowel control? N  Managing your Medications? N  Managing your Finances? N  Housekeeping or managing your Housekeeping? N  Some recent data might be hidden   6CIT Screen 03/13/2017  What Year? 0 points  What month? 0 points  What time? 0 points  Count back from 20 0 points  Months in reverse 0 points  Repeat phrase 0 points  Total Score 0     Fall Risk Assessment Fall Risk  03/13/2017 12/22/2015  Falls in the past year? No Yes  Number falls in past yr: - 1  Injury with Fall? - Yes     Depression Screen PHQ 2/9 Scores 03/13/2017 03/13/2017 12/22/2015  PHQ - 2 Score 0 0 0  PHQ- 9 Score 3 - -     Assessment & Plan:    1. Annual physical exam - CBC with Differential/Platelet - Comprehensive metabolic panel - Lipid Panel With LDL/HDL Ratio -  TSH - POCT urinalysis dipstick  2. Colon cancer screening - IFOBT POC (occult bld, rslt in office); Future  3. Other hyperlipidemia  HPI, Exam and A&P transcribed by Theressa Millard, RMA under direction and in the presence of Miguel Aschoff, MD. I have done the exam and reviewed the chart and it is accurate to the best of my knowledge. Development worker, community has been used and  any errors in dictation or transcription are unintentional. Miguel Aschoff M.D. [facility]

## 2017-03-22 LAB — COMPREHENSIVE METABOLIC PANEL
ALBUMIN: 4.4 g/dL (ref 3.6–4.8)
ALT: 20 IU/L (ref 0–44)
AST: 28 IU/L (ref 0–40)
Albumin/Globulin Ratio: 1.8 (ref 1.2–2.2)
Alkaline Phosphatase: 102 IU/L (ref 39–117)
BILIRUBIN TOTAL: 1 mg/dL (ref 0.0–1.2)
BUN / CREAT RATIO: 17 (ref 10–24)
BUN: 15 mg/dL (ref 8–27)
CALCIUM: 9 mg/dL (ref 8.6–10.2)
CO2: 24 mmol/L (ref 20–29)
CREATININE: 0.86 mg/dL (ref 0.76–1.27)
Chloride: 104 mmol/L (ref 96–106)
GFR, EST AFRICAN AMERICAN: 104 mL/min/{1.73_m2} (ref >59)
GFR, EST NON AFRICAN AMERICAN: 90 mL/min/{1.73_m2} (ref >59)
GLUCOSE: 97 mg/dL (ref 65–99)
Globulin, Total: 2.4 g/dL (ref 1.5–4.5)
Potassium: 5.2 mmol/L (ref 3.5–5.2)
Sodium: 142 mmol/L (ref 134–144)
TOTAL PROTEIN: 6.8 g/dL (ref 6.0–8.5)

## 2017-03-22 LAB — CBC WITH DIFFERENTIAL/PLATELET
BASOS ABS: 0.1 10*3/uL (ref 0.0–0.2)
Basos: 1 %
EOS (ABSOLUTE): 0.2 10*3/uL (ref 0.0–0.4)
Eos: 3 %
HEMOGLOBIN: 15.1 g/dL (ref 13.0–17.7)
Hematocrit: 45.6 % (ref 37.5–51.0)
IMMATURE GRANS (ABS): 0 10*3/uL (ref 0.0–0.1)
Immature Granulocytes: 0 %
LYMPHS: 23 %
Lymphocytes Absolute: 1.3 10*3/uL (ref 0.7–3.1)
MCH: 30.8 pg (ref 26.6–33.0)
MCHC: 33.1 g/dL (ref 31.5–35.7)
MCV: 93 fL (ref 79–97)
MONOCYTES: 10 %
Monocytes Absolute: 0.6 10*3/uL (ref 0.1–0.9)
NEUTROS ABS: 3.7 10*3/uL (ref 1.4–7.0)
Neutrophils: 63 %
Platelets: 150 10*3/uL (ref 150–379)
RBC: 4.91 x10E6/uL (ref 4.14–5.80)
RDW: 15.2 % (ref 12.3–15.4)
WBC: 5.8 10*3/uL (ref 3.4–10.8)

## 2017-03-22 LAB — TSH: TSH: 1.45 u[IU]/mL (ref 0.450–4.500)

## 2017-03-22 LAB — LIPID PANEL WITH LDL/HDL RATIO
Cholesterol, Total: 193 mg/dL (ref 100–199)
HDL: 40 mg/dL (ref >39)
LDL CALC: 143 mg/dL — AB (ref 0–99)
LDL/HDL RATIO: 3.6 ratio (ref 0.0–3.6)
Triglycerides: 52 mg/dL (ref 0–149)
VLDL CHOLESTEROL CAL: 10 mg/dL (ref 5–40)

## 2017-06-16 ENCOUNTER — Other Ambulatory Visit: Payer: Self-pay | Admitting: Family Medicine

## 2017-07-09 DIAGNOSIS — G4733 Obstructive sleep apnea (adult) (pediatric): Secondary | ICD-10-CM | POA: Diagnosis not present

## 2017-07-09 DIAGNOSIS — G478 Other sleep disorders: Secondary | ICD-10-CM | POA: Diagnosis not present

## 2017-07-12 ENCOUNTER — Ambulatory Visit: Payer: Medicare Other | Admitting: Family Medicine

## 2017-07-12 ENCOUNTER — Encounter: Payer: Self-pay | Admitting: Family Medicine

## 2017-07-12 VITALS — BP 122/80 | HR 98 | Temp 98.4°F | Resp 16 | Wt 239.4 lb

## 2017-07-12 DIAGNOSIS — J069 Acute upper respiratory infection, unspecified: Secondary | ICD-10-CM

## 2017-07-12 NOTE — Patient Instructions (Signed)
Discussed use of saline nasal spray to humidify your nasal passages. Use a nasal decongestant spray right after you have a nose bleed to constrict the bleeding vessels. Discussed use of Mucinex D for congestion, Delsym for cough, and Benadryl for postnasal drainage.

## 2017-07-12 NOTE — Progress Notes (Signed)
Subjective:     Patient ID: Louis Luna, male   DOB: 06/09/1951, 66 y.o.   MRN: [id number] Chief Complaint  Patient presents with  . URI    Patient comes in office today with complaints of cold like symptoms over the past 48hrs. Patient reports cough, runny nose and nose bleeds.   States he has bled from only the right nostril. Controls with pressures and use of saline nasal spray. HPI   Review of Systems     Objective:   Physical Exam  Constitutional: He appears well-developed and well-nourished. No distress.  Ears: T.M's intact without inflammation Sinuses: non-tender Throat: no tonsillar enlargement or exudate Neck: no cervical adenopathy Lungs: clear     Assessment:    1. Viral upper respiratory tract infection    Plan:    Discussed sx treatment and use of saline nasal spray to humidify and topical decongestant right after a nose bleed.

## 2017-07-15 ENCOUNTER — Telehealth: Payer: Self-pay | Admitting: Family Medicine

## 2017-07-15 ENCOUNTER — Ambulatory Visit: Payer: Medicare Other | Admitting: Family Medicine

## 2017-07-15 ENCOUNTER — Encounter: Payer: Self-pay | Admitting: Family Medicine

## 2017-07-15 VITALS — BP 140/84 | HR 60 | Temp 98.2°F | Resp 16 | Wt 235.0 lb

## 2017-07-15 DIAGNOSIS — J40 Bronchitis, not specified as acute or chronic: Secondary | ICD-10-CM

## 2017-07-15 DIAGNOSIS — R05 Cough: Secondary | ICD-10-CM

## 2017-07-15 DIAGNOSIS — R059 Cough, unspecified: Secondary | ICD-10-CM

## 2017-07-15 DIAGNOSIS — H1033 Unspecified acute conjunctivitis, bilateral: Secondary | ICD-10-CM

## 2017-07-15 MED ORDER — CIPROFLOXACIN HCL 0.3 % OP SOLN: OPHTHALMIC | 0 refills | Status: DC

## 2017-07-15 MED ORDER — AZITHROMYCIN 250 MG PO TABS: ORAL_TABLET | ORAL | 0 refills | Status: AC

## 2017-07-15 NOTE — Telephone Encounter (Signed)
Patient states that "infection" has spread to his eyes and he has greenish like mucous coming from nose and eyes he states that he has an appt this afternoon in office to be evaluated. KW

## 2017-07-15 NOTE — Progress Notes (Signed)
Patient: Louis Luna Male    DOB: 1951/06/19   66 y.o.   MRN: [id number] Visit Date: 07/15/2017  Today's Provider: Lelon Huh, MD   Chief Complaint  Patient presents with  . Cough   Subjective:    Complains of 1 week history nasal congestion and drainage with cough which has started producing green sputum over the last few days. No dyspnea, no chest pain, no fever, had mild sore throat the first few days.  Also having drainage from his eyes the last coupe of days. Eyes have been red, but not painful. Drainage has been mostly clear but started getting yellow the last day or two.     No Known Allergies   Current Outpatient Medications:  .  aspirin EC 81 MG tablet, Take 81 mg by mouth daily., Disp: , Rfl:  .  atenolol (TENORMIN) 25 MG tablet, take 2 tablets by mouth once daily, Disp: 180 tablet, Rfl: 3 .  atenolol (TENORMIN) 50 MG tablet, Take 1 tablet (50 mg total) by mouth daily., Disp: 30 tablet, Rfl: 12 .  diazepam (VALIUM) 5 MG tablet, take 1 tablet by mouth once daily if needed for anxiety, Disp: 30 tablet, Rfl: 5 .  glucosamine-chondroitin 500-400 MG tablet, Take 1 tablet by mouth daily., Disp: , Rfl:  .  ibuprofen (ADVIL,MOTRIN) 800 MG tablet, Take 1 tablet (800 mg total) by mouth every 8 (eight) hours as needed for moderate pain., Disp: 15 tablet, Rfl: 0 .  Multiple Vitamin (MULTIVITAMIN WITH MINERALS) TABS tablet, Take 1 tablet by mouth daily., Disp: , Rfl:  .  psyllium (METAMUCIL SMOOTH TEXTURE) 28 % packet, Take 1 packet by mouth daily., Disp: , Rfl:  .  saw palmetto 80 MG capsule, Take 80 mg by mouth 2 (two) times daily., Disp: , Rfl:  .  sertraline (ZOLOFT) 50 MG tablet, take 1 tablet by mouth once daily, Disp: 30 tablet, Rfl: 12  Review of Systems  Constitutional: Negative for appetite change, chills and fever.  HENT: Positive for congestion.   Respiratory: Positive for cough. Negative for chest tightness, shortness of breath and wheezing.     Cardiovascular: Negative for chest pain and palpitations.  Gastrointestinal: Negative for abdominal pain, nausea and vomiting.    Social History   Tobacco Use  . Smoking status: Former Smoker    Packs/day: 1.00    Years: 30.00    Pack years: 30.00    Types: Cigarettes    Last attempt to quit: 10/26/2011    Years since quitting: 5.7  . Smokeless tobacco: Never Used  Substance Use Topics  . Alcohol use: Yes    Comment: ocassionally-beer   Objective:   BP 140/84 (BP Location: Right Arm, Patient Position: Sitting, Cuff Size: Large)   Pulse 60   Temp 98.2 F (36.8 C) (Oral)   Resp 16   Wt 235 lb (106.6 kg)   SpO2 97%   BMI 35.73 kg/m  Vitals:   07/15/17 1636  BP: 140/84  Pulse: 60  Resp: 16  Temp: 98.2 F (36.8 C)  TempSrc: Oral  SpO2: 97%  Weight: 235 lb (106.6 kg)     Physical Exam  General Appearance:    Alert, cooperative, no distress  HENT:   bilateral TM normal without fluid or infection, neck without nodes, pharynx erythematous without exudate, sinuses nontender, nasal mucosa congested and nasal mucosa pale and congested  Eyes:    PERRL,  EOM's intact. Conjunctiva injected. Small amount clear, yellowish  discharge.   Lungs:     Rare expiratory wheeze, no rales.  respirations unlabored  Heart:    Regular rate and rhythm  Neurologic:   Awake, alert, oriented x 3. No apparent focal neurological           defect.           Assessment & Plan:     1. Cough   2. Bronchitis  - azithromycin (ZITHROMAX) 250 MG tablet; 2 by mouth today, then 1 daily for 4 days  Dispense: 6 tablet; Refill: 0  3. Acute conjunctivitis of both eyes, unspecified acute conjunctivitis type  - ciprofloxacin (CILOXAN) 0.3 % ophthalmic solution; 1 drop, every 4 hours, while awake, for the next 5 days.  Dispense: 5 mL; Refill: 0  Call if symptoms change or if not rapidly improving.          Lelon Huh, MD  [facility]

## 2017-07-15 NOTE — Telephone Encounter (Signed)
Please review. Thanks!  

## 2017-07-15 NOTE — Telephone Encounter (Signed)
Pt states he was seen last week for congestion.  Pt states he is not any better.  Pt is having green mucus from his nose and coughing up green mucus from his chest.  Pt is asking if he can get something to help with this.   [facility] [address]/close to [place].  CB#[phone]/MW

## 2017-07-15 NOTE — Telephone Encounter (Signed)
Continue Mucinex D. Give your sinuses another two days to improve. Call me Wednesday AM if no change.

## 2017-07-17 ENCOUNTER — Other Ambulatory Visit: Payer: Self-pay | Admitting: Family Medicine

## 2017-07-17 NOTE — Telephone Encounter (Signed)
Please review for dr Rosanna Randy. Thank Edrick Kins, RMA

## 2018-01-14 ENCOUNTER — Other Ambulatory Visit: Payer: Self-pay | Admitting: Family Medicine

## 2018-01-14 DIAGNOSIS — F32A Depression, unspecified: Secondary | ICD-10-CM

## 2018-01-14 DIAGNOSIS — F329 Major depressive disorder, single episode, unspecified: Secondary | ICD-10-CM

## 2018-01-17 ENCOUNTER — Other Ambulatory Visit: Payer: Self-pay | Admitting: Family Medicine

## 2018-02-13 IMAGING — CR DG CHEST 2V
1 series · 2 of 2 positions shown · non-contrast
Comparison: 01/27/2016 chest CT

CLINICAL DATA: Productive cough and chills.  Abnormal lung exam

EXAM:
CHEST  2 VIEW

[Series 1: dg chest 2 view · 0.14mm/px · 2 of 2 slices shown]
[im 1/2]
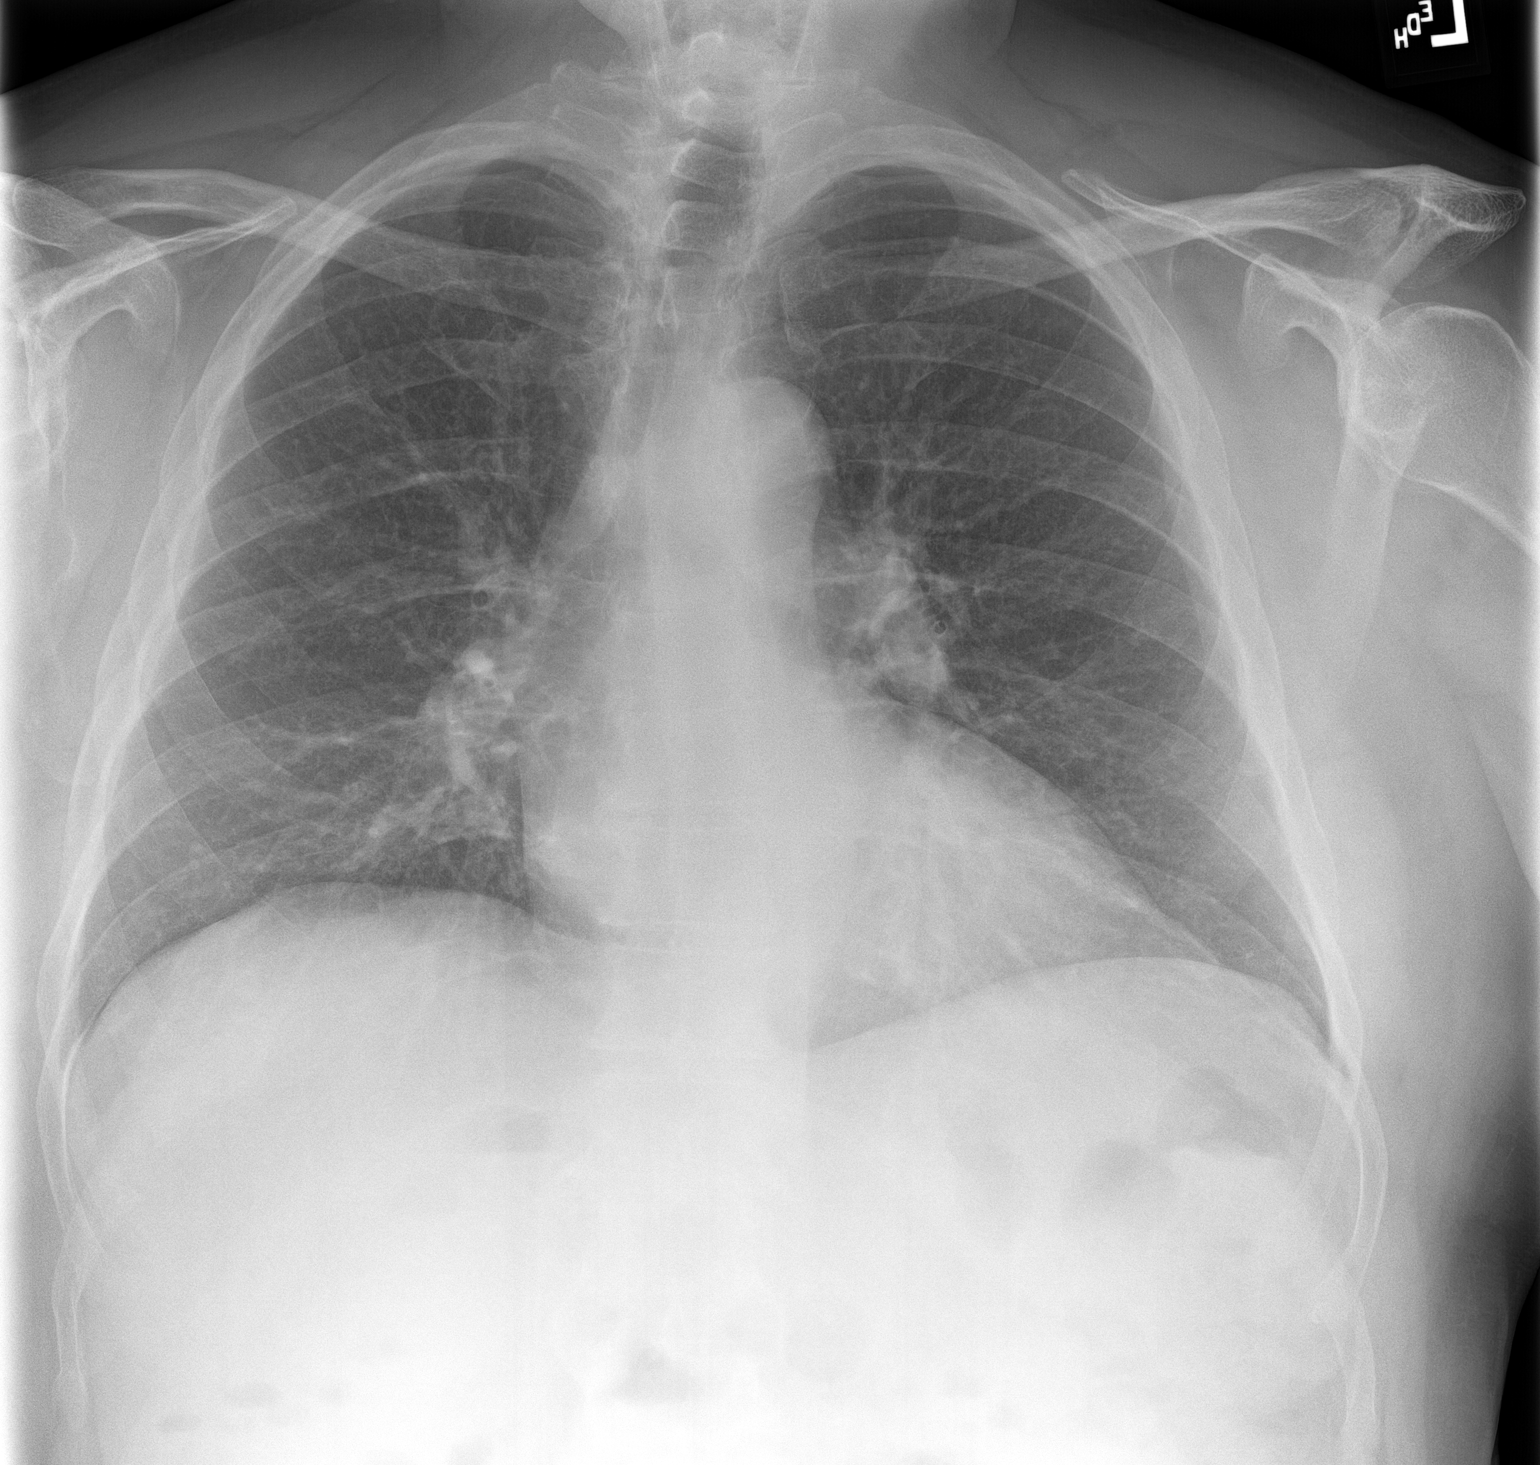
[im 2/2]
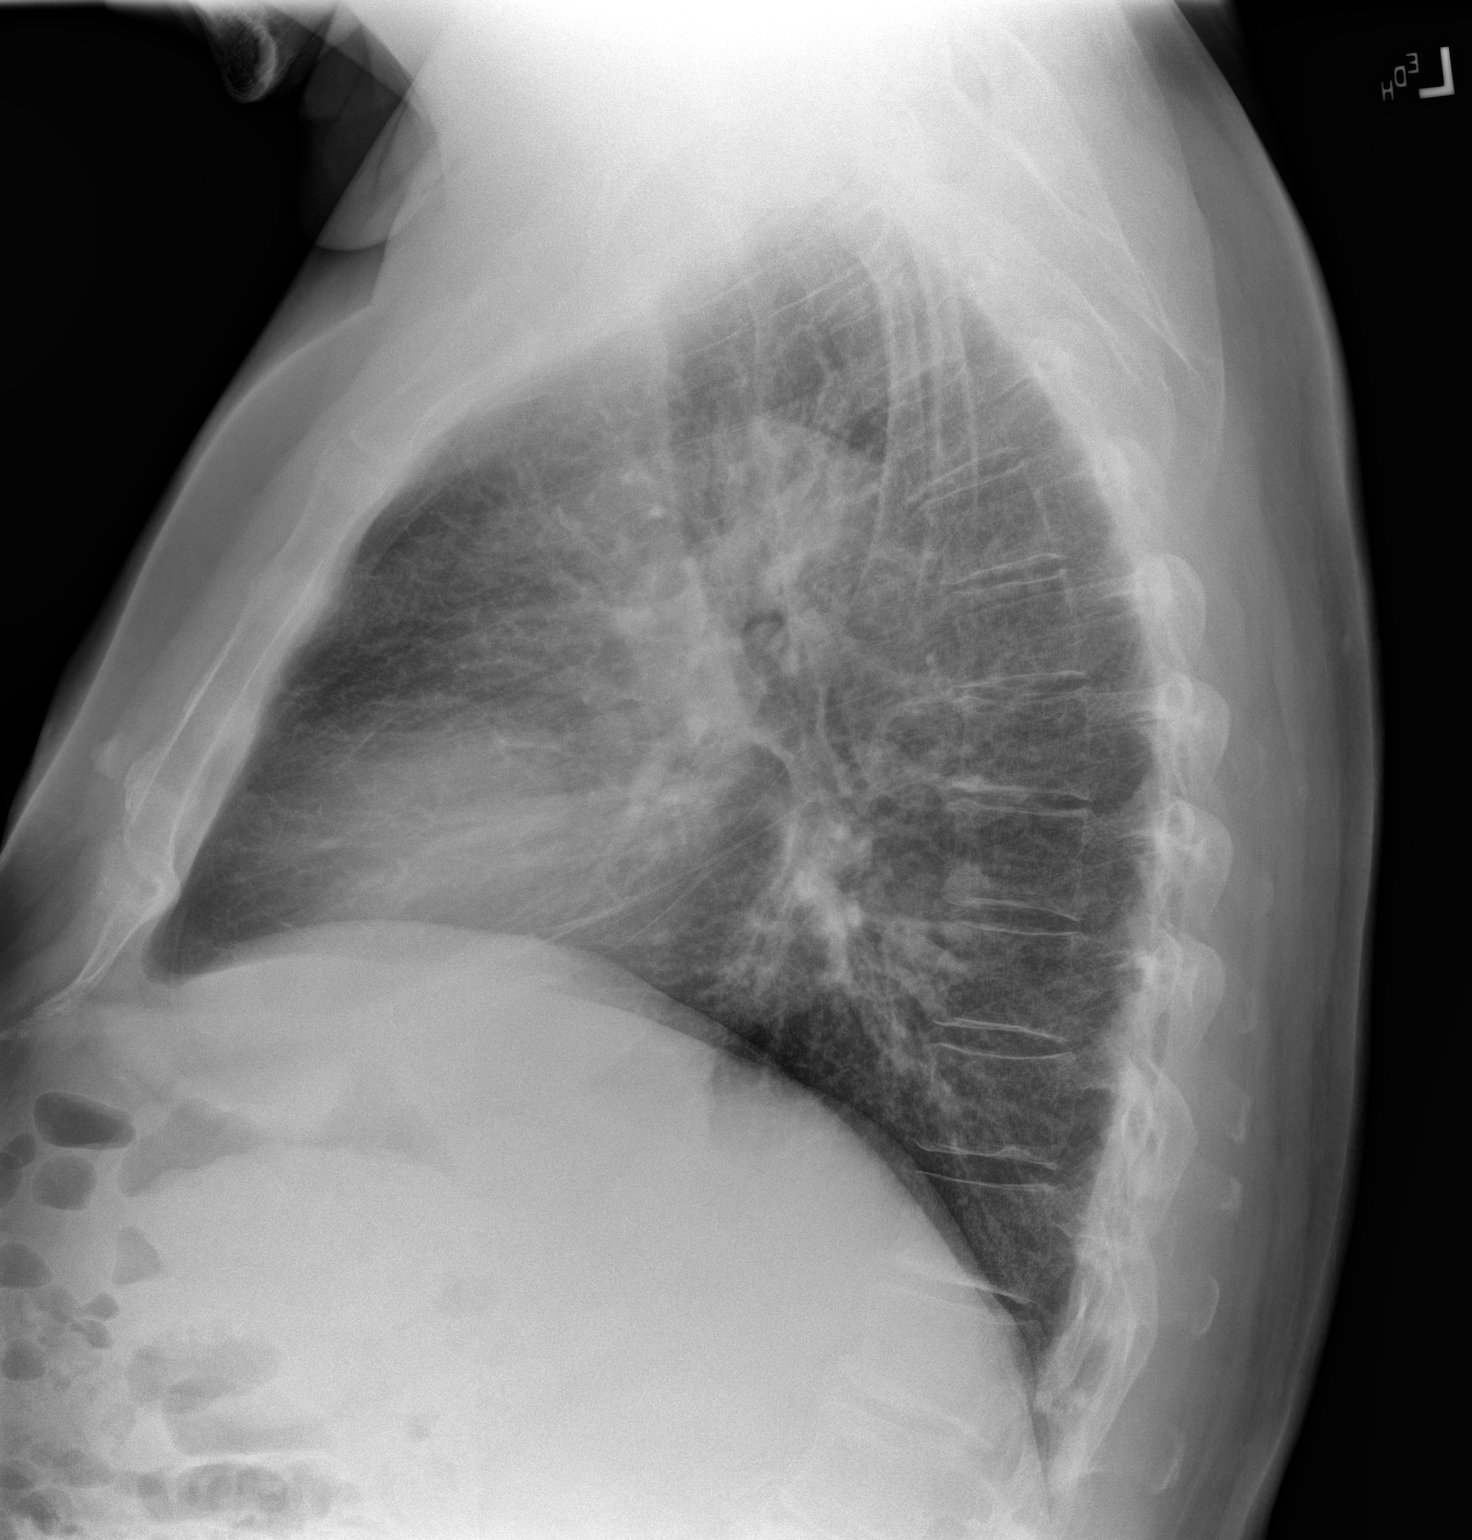

[2 of 2 positions shown; findings below may reference images not displayed]

FINDINGS: Increased markings over the central lower lobes in the lateral
projection. No air bronchograms seen frontally, but there is mildly
asymmetric left infrahilar markings. No edema, effusion, or
pneumothorax. Normal heart size. No acute osseous finding.
IMPRESSION: Possible left lower lobe bronchopneumonia. Followup PA and lateral
chest X-ray is recommended in 3-4 weeks to ensure resolution

## 2018-03-11 ENCOUNTER — Telehealth: Payer: Self-pay | Admitting: Nurse Practitioner

## 2018-03-28 ENCOUNTER — Telehealth: Payer: Self-pay | Admitting: *Deleted

## 2018-03-28 NOTE — Telephone Encounter (Signed)
Patient contacted regarding lung screening scan being due. Patient currently refuses to continue lung screening but will discuss further with his PCP.

## 2018-04-10 ENCOUNTER — Other Ambulatory Visit: Payer: Self-pay | Admitting: Family Medicine

## 2018-04-10 DIAGNOSIS — F32A Depression, unspecified: Secondary | ICD-10-CM

## 2018-04-10 DIAGNOSIS — F329 Major depressive disorder, single episode, unspecified: Secondary | ICD-10-CM

## 2018-06-11 ENCOUNTER — Other Ambulatory Visit: Payer: Self-pay | Admitting: Family Medicine

## 2018-08-10 DIAGNOSIS — H524 Presbyopia: Secondary | ICD-10-CM | POA: Diagnosis not present

## 2018-10-09 DIAGNOSIS — G478 Other sleep disorders: Secondary | ICD-10-CM | POA: Diagnosis not present

## 2018-10-09 DIAGNOSIS — G4733 Obstructive sleep apnea (adult) (pediatric): Secondary | ICD-10-CM | POA: Diagnosis not present

## 2019-03-13 ENCOUNTER — Other Ambulatory Visit: Payer: Self-pay

## 2019-03-13 DIAGNOSIS — F329 Major depressive disorder, single episode, unspecified: Secondary | ICD-10-CM

## 2019-03-13 DIAGNOSIS — F32A Depression, unspecified: Secondary | ICD-10-CM

## 2019-03-13 NOTE — Telephone Encounter (Signed)
Patient called requesting refills. He has not been seen since 2018.Louis Luna Please review. Thanks!

## 2019-03-16 MED ORDER — SERTRALINE HCL 50 MG PO TABS: 50.0000 mg | ORAL_TABLET | Freq: Every day | ORAL | 0 refills | Status: DC

## 2019-03-16 MED ORDER — ATENOLOL 50 MG PO TABS: 50.0000 mg | ORAL_TABLET | Freq: Every day | ORAL | 0 refills | Status: DC

## 2019-03-16 MED ORDER — DIAZEPAM 5 MG PO TABS: ORAL_TABLET | ORAL | 0 refills | Status: DC

## 2019-04-07 ENCOUNTER — Other Ambulatory Visit: Payer: Self-pay | Admitting: Family Medicine

## 2019-04-07 DIAGNOSIS — F32A Depression, unspecified: Secondary | ICD-10-CM

## 2019-04-07 DIAGNOSIS — F329 Major depressive disorder, single episode, unspecified: Secondary | ICD-10-CM

## 2019-04-07 NOTE — Telephone Encounter (Signed)
Left message to call and schedule appt

## 2019-04-07 NOTE — Telephone Encounter (Signed)
Please schedule appt as pt has not been seen in 2 years.

## 2019-06-08 ENCOUNTER — Other Ambulatory Visit: Payer: Self-pay | Admitting: Family Medicine

## 2019-06-09 ENCOUNTER — Ambulatory Visit: Payer: Self-pay

## 2019-06-10 ENCOUNTER — Other Ambulatory Visit: Payer: Self-pay | Admitting: Family Medicine

## 2019-06-10 NOTE — Telephone Encounter (Signed)
Pharmacy requesting refills. Please review. Thanks!

## 2019-06-11 DIAGNOSIS — G4733 Obstructive sleep apnea (adult) (pediatric): Secondary | ICD-10-CM | POA: Diagnosis not present

## 2019-06-11 DIAGNOSIS — G478 Other sleep disorders: Secondary | ICD-10-CM | POA: Diagnosis not present

## 2019-07-07 ENCOUNTER — Other Ambulatory Visit: Payer: Self-pay | Admitting: Family Medicine

## 2019-07-07 DIAGNOSIS — F32A Depression, unspecified: Secondary | ICD-10-CM

## 2019-07-07 DIAGNOSIS — F329 Major depressive disorder, single episode, unspecified: Secondary | ICD-10-CM

## 2019-07-08 NOTE — Progress Notes (Signed)
Patient: Louis Luna, Male    DOB: 10-12-1950, 68 y.o.   MRN: [id number] Visit Date: 07/09/2019  Today's Provider: [name], MD   Chief Complaint  Patient presents with  . Medicare Wellness   Subjective:     Annual wellness visit Louis Luna is a 68 y.o. male. He feels well. He reports exercising yes. He reports he is sleeping fairly well.  -----------------------------------------------------------  Colonoscopy: 03/23/2016  Review of Systems  Constitutional: Negative.   HENT: Positive for postnasal drip.   Eyes: Negative.   Respiratory: Negative.   Cardiovascular: Negative.   Gastrointestinal: Negative.   Genitourinary: Positive for frequency.  Musculoskeletal: Positive for arthralgias.  Allergic/Immunologic: Negative.   Neurological: Negative.   Hematological: Negative.   Psychiatric/Behavioral: Negative.     Social History   Socioeconomic History  . Marital status: Married    Spouse name: [name]  . Number of children: 2  . Years of education: 11  . Highest education level: Not on file  Occupational History  . Occupation: retired  Scientific laboratory technician  . Financial resource strain: Not on file  . Food insecurity    Worry: Not on file    Inability: Not on file  . Transportation needs    Medical: Not on file    Non-medical: Not on file  Tobacco Use  . Smoking status: Former Smoker    Packs/day: 1.00    Years: 30.00    Pack years: 30.00    Types: Cigarettes    Quit date: 10/26/2011    Years since quitting: 7.7  . Smokeless tobacco: Never Used  Substance and Sexual Activity  . Alcohol use: Yes    Comment: ocassionally-beer  . Drug use: No  . Sexual activity: Never    Birth control/protection: None  Lifestyle  . Physical activity    Days per week: Not on file    Minutes per session: Not on file  . Stress: Not on file  Relationships  . Social Herbalist on phone: Not on file    Gets together: Not on file    Attends  religious service: Not on file    Active member of club or organization: Not on file    Attends meetings of clubs or organizations: Not on file    Relationship status: Not on file  . Intimate partner violence    Fear of current or ex partner: Not on file    Emotionally abused: Not on file    Physically abused: Not on file    Forced sexual activity: Not on file  Other Topics Concern  . Not on file  Social History Narrative  . Not on file    Past Medical History:  Diagnosis Date  . Coronary artery disease   . Depression   . GERD (gastroesophageal reflux disease)   . H/O elevated lipids   . Hypertension   . Personal history of tobacco use, presenting hazards to health 01/26/2016  . Sleep apnea      Patient Active Problem List   Diagnosis Date Noted  . Personal history of tobacco use, presenting hazards to health 01/26/2016  . Quadriceps muscle rupture 05/04/2015  . Atherosclerosis of coronary artery 01/27/2015  . Clinical depression 01/27/2015  . Acid reflux 01/27/2015  . History of tobacco use 01/27/2015  . HLD (hyperlipidemia) 01/27/2015  . BP (high blood pressure) 01/27/2015  . Fatty tumor 01/27/2015  . Obstructive sleep apnea 01/27/2015  Past Surgical History:  Procedure Laterality Date  . ANKLE SURGERY    . APPENDECTOMY    . COLONOSCOPY N/A 03/23/2016   Procedure: COLONOSCOPY;  Surgeon: Manya Silvas, MD;  Location: [facility] ENDOSCOPY;  Service: Endoscopy;  Laterality: N/A;  . HERNIA REPAIR     inguinal-left 04/2009  . QUADRICEPS TENDON REPAIR Left 05/05/2015   Procedure: REPAIR QUADRICEP TENDON;  Surgeon: Hessie Knows, MD;  Location: [facility] ORS;  Service: Orthopedics;  Laterality: Left;  . TUMOR EXCISION     from Rt side of the neck-benign    His family history includes Alcohol abuse in his brother and father; Alzheimer's disease in his father; Breast cancer in his mother, sister, and sister; Depression in his sister and sister; Diabetes in his mother; Hypertension  in his brother, brother, father, and mother; Melanoma in his sister.   Current Outpatient Medications:  .  aspirin EC 81 MG tablet, Take 81 mg by mouth daily., Disp: , Rfl:  .  atenolol (TENORMIN) 50 MG tablet, TAKE 1 TABLET BY MOUTH EVERY DAY, Disp: 90 tablet, Rfl: 0 .  diazepam (VALIUM) 5 MG tablet, TAKE 1 TABLET BY MOUTH ONCE DAILY IF NEEDED FOR ANXIETY, Disp: 30 tablet, Rfl: 0 .  Docusate Calcium (STOOL SOFTENER PO), Take by mouth., Disp: , Rfl:  .  Doxylamine Succinate, Sleep, (SLEEP AID PO), Take by mouth., Disp: , Rfl:  .  ibuprofen (ADVIL,MOTRIN) 800 MG tablet, Take 1 tablet (800 mg total) by mouth every 8 (eight) hours as needed for moderate pain., Disp: 15 tablet, Rfl: 0 .  Multiple Vitamin (MULTIVITAMIN WITH MINERALS) TABS tablet, Take 1 tablet by mouth daily., Disp: , Rfl:  .  psyllium (METAMUCIL SMOOTH TEXTURE) 28 % packet, Take 1 packet by mouth daily., Disp: , Rfl:  .  saw palmetto 80 MG capsule, Take 80 mg by mouth 2 (two) times daily., Disp: , Rfl:  .  sertraline (ZOLOFT) 50 MG tablet, TAKE 1 TABLET BY MOUTH ONCE DAILY, Disp: 60 tablet, Rfl: 0 .  atenolol (TENORMIN) 25 MG tablet, take 2 tablets by mouth once daily (Patient not taking: Reported on 07/09/2019), Disp: 180 tablet, Rfl: 3 .  ciprofloxacin (CILOXAN) 0.3 % ophthalmic solution, 1 drop, every 4 hours, while awake, for the next 5 days. (Patient not taking: Reported on 07/09/2019), Disp: 5 mL, Rfl: 0 .  glucosamine-chondroitin 500-400 MG tablet, Take 1 tablet by mouth daily., Disp: , Rfl:   Patient Care Team: Jerrol Banana., MD as PCP - General (Family Medicine) Dingeldein, Remo Lipps, MD as Consulting Physician (Ophthalmology)    Objective:    Vitals: BP (!) 149/84 (BP Location: Right Arm, Patient Position: Sitting, Cuff Size: Large)   Pulse 64   Temp (!) 97.3 F (36.3 C) (Other (Comment))   Resp 18   Ht 5\' 9"  (1.753 m)   Wt 218 lb (98.9 kg)   SpO2 96%   BMI 32.19 kg/m   Physical Exam Vitals signs  reviewed.  Constitutional:      Appearance: Normal appearance.  HENT:     Head: Normocephalic and atraumatic.     Right Ear: Tympanic membrane and external ear normal.     Left Ear: Tympanic membrane and external ear normal.  Eyes:     General: No scleral icterus.    Conjunctiva/sclera: Conjunctivae normal.  Cardiovascular:     Rate and Rhythm: Normal rate. Rhythm irregular.     Pulses: Normal pulses.     Heart sounds: Normal heart sounds.  Pulmonary:  Effort: Pulmonary effort is normal.     Breath sounds: Normal breath sounds.  Abdominal:     Palpations: Abdomen is soft.  Skin:    General: Skin is warm and dry.     Comments: Multiple lipomas.  Neurological:     General: No focal deficit present.     Mental Status: He is alert and oriented to person, place, and time.  Psychiatric:        Mood and Affect: Mood normal.        Behavior: Behavior normal.        Thought Content: Thought content normal.        Judgment: Judgment normal.     Activities of Daily Living In your present state of health, do you have any difficulty performing the following activities: 07/09/2019  Hearing? N  Vision? N  Difficulty concentrating or making decisions? N  Walking or climbing stairs? N  Dressing or bathing? N  Doing errands, shopping? N  Some recent data might be hidden    Fall Risk Assessment Fall Risk  07/09/2019 03/13/2017 12/22/2015  Falls in the past year? 0 No Yes  Number falls in past yr: 0 - 1  Injury with Fall? 0 - Yes  Follow up Falls evaluation completed - -     Depression Screen PHQ 2/9 Scores 07/09/2019 03/13/2017 03/13/2017 12/22/2015  PHQ - 2 Score 0 0 0 0  PHQ- 9 Score 3 3 - -    6CIT Screen 03/13/2017  What Year? 0 points  What month? 0 points  What time? 0 points  Count back from 20 0 points  Months in reverse 0 points  Repeat phrase 0 points  Total Score 0     Cognitive Testing - 6-CIT  Correct? Score   What year is it? yes 0 0 or 4  What month is  it? yes 0 0 or 3  Memorize:    Pia Mau,  41,  [address]  [place],      What time is it? (within 1 hour) yes 0 0 or 3  Count backwards from 20 yes 0 0, 2, or 4  Name the months of the year yes 0 0, 2, or 4  Repeat name & address above yes 0 0, 2, 4, 6, 8, or 10       TOTAL SCORE  0/28   Interpretation:  Normal  Normal (0-7) Abnormal (8-28)    Assessment & Plan:     Annual Wellness Visit  Reviewed patient's Family Medical History Reviewed and updated list of patient's medical providers Assessment of cognitive impairment was done Assessed patient's functional ability Established a written schedule for health screening [place] Completed and Reviewed  Exercise Activities and Dietary recommendations Goals    . Exercise 150 minutes per week (moderate activity)     Continue exercising for over 150 minutes a week.        Immunization History  Administered Date(s) Administered  . Fluad Quad(high Dose 65+) 06/05/2019  . Influenza Split 06/28/2011, 07/14/2012  . Influenza, High Dose Seasonal PF 06/11/2016, 07/15/2018  . Pneumococcal Conjugate-13 03/13/2017  . Tdap 08/19/2007  . Zoster 02/07/2012    Health Maintenance  Topic Date Due  . Samul Dada  08/18/2017  . PNA vac Low Risk Adult (2 of 2 - PPSV23) 03/13/2018  . COLONOSCOPY  03/23/2026  . INFLUENZA VACCINE  Completed  . Hepatitis C Screening  Completed     Discussed health benefits of physical  activity, and encouraged him to engage in regular exercise appropriate for his age and condition.     1. Annual physical exam  - Lipid panel - CBC w/Diff/Platelet - Comprehensive Metabolic Panel (CMET) - TSH - PSA  2. Other hyperlipidemia  - Lipid panel - CBC w/Diff/Platelet - Comprehensive Metabolic Panel (CMET) - TSH - PSA  3. Essential hypertension RTC 6 months. - Lipid panel - CBC w/Diff/Platelet - Comprehensive Metabolic Panel (CMET) - TSH - PSA  4. Need for pneumococcal  vaccine  - Pneumococcal polysaccharide vaccine 23-valent greater than or equal to 2yo subcutaneous/IM 5.AWV Medicare  Follow up in 6 months for HTN.  I,[name],acting as a scribe for [name], MD.,have documented all relevant documentation on the behalf of [name], MD,as directed by  [name], MD while in the presence of [name], MD.    [name], MD  [facility]

## 2019-07-09 ENCOUNTER — Other Ambulatory Visit: Payer: Self-pay | Admitting: *Deleted

## 2019-07-09 ENCOUNTER — Ambulatory Visit (INDEPENDENT_AMBULATORY_CARE_PROVIDER_SITE_OTHER): Payer: Medicare Other | Admitting: Family Medicine

## 2019-07-09 ENCOUNTER — Other Ambulatory Visit: Payer: Self-pay

## 2019-07-09 ENCOUNTER — Encounter: Payer: Self-pay | Admitting: Family Medicine

## 2019-07-09 VITALS — BP 149/84 | HR 64 | Temp 97.3°F | Resp 18 | Ht 69.0 in | Wt 218.0 lb

## 2019-07-09 DIAGNOSIS — I10 Essential (primary) hypertension: Secondary | ICD-10-CM

## 2019-07-09 DIAGNOSIS — F32A Depression, unspecified: Secondary | ICD-10-CM

## 2019-07-09 DIAGNOSIS — Z23 Encounter for immunization: Secondary | ICD-10-CM | POA: Diagnosis not present

## 2019-07-09 DIAGNOSIS — E7849 Other hyperlipidemia: Secondary | ICD-10-CM

## 2019-07-09 DIAGNOSIS — Z0000 Encounter for general adult medical examination without abnormal findings: Secondary | ICD-10-CM

## 2019-07-09 DIAGNOSIS — F329 Major depressive disorder, single episode, unspecified: Secondary | ICD-10-CM

## 2019-07-09 MED ORDER — SERTRALINE HCL 50 MG PO TABS: 50.0000 mg | ORAL_TABLET | Freq: Every day | ORAL | 3 refills | Status: DC

## 2019-07-09 MED ORDER — ATENOLOL 50 MG PO TABS: 50.0000 mg | ORAL_TABLET | Freq: Every day | ORAL | 3 refills | Status: DC

## 2019-07-09 MED ORDER — DIAZEPAM 5 MG PO TABS: ORAL_TABLET | ORAL | 4 refills | Status: DC

## 2019-07-10 ENCOUNTER — Telehealth: Payer: Self-pay

## 2019-07-10 LAB — CBC WITH DIFFERENTIAL/PLATELET
Basophils Absolute: 0.1 10*3/uL (ref 0.0–0.2)
Basos: 1 %
EOS (ABSOLUTE): 0.2 10*3/uL (ref 0.0–0.4)
Eos: 3 %
Hematocrit: 44.1 % (ref 37.5–51.0)
Hemoglobin: 15.3 g/dL (ref 13.0–17.7)
Immature Grans (Abs): 0 10*3/uL (ref 0.0–0.1)
Immature Granulocytes: 0 %
Lymphocytes Absolute: 1.3 10*3/uL (ref 0.7–3.1)
Lymphs: 22 %
MCH: 30.7 pg (ref 26.6–33.0)
MCHC: 34.7 g/dL (ref 31.5–35.7)
MCV: 88 fL (ref 79–97)
Monocytes Absolute: 0.5 10*3/uL (ref 0.1–0.9)
Monocytes: 8 %
Neutrophils Absolute: 4 10*3/uL (ref 1.4–7.0)
Neutrophils: 66 %
Platelets: 176 10*3/uL (ref 150–450)
RBC: 4.99 x10E6/uL (ref 4.14–5.80)
RDW: 14.5 % (ref 11.6–15.4)
WBC: 6.1 10*3/uL (ref 3.4–10.8)

## 2019-07-10 LAB — COMPREHENSIVE METABOLIC PANEL
ALT: 18 IU/L (ref 0–44)
AST: 23 IU/L (ref 0–40)
Albumin/Globulin Ratio: 1.8 (ref 1.2–2.2)
Albumin: 4.6 g/dL (ref 3.8–4.8)
Alkaline Phosphatase: 99 IU/L (ref 39–117)
BUN/Creatinine Ratio: 14 (ref 10–24)
BUN: 12 mg/dL (ref 8–27)
Bilirubin Total: 1.3 mg/dL — ABNORMAL HIGH (ref 0.0–1.2)
CO2: 24 mmol/L (ref 20–29)
Calcium: 9.4 mg/dL (ref 8.6–10.2)
Chloride: 104 mmol/L (ref 96–106)
Creatinine, Ser: 0.86 mg/dL (ref 0.76–1.27)
GFR calc Af Amer: 102 mL/min/{1.73_m2} (ref >59)
GFR calc non Af Amer: 88 mL/min/{1.73_m2} (ref >59)
Globulin, Total: 2.5 g/dL (ref 1.5–4.5)
Glucose: 86 mg/dL (ref 65–99)
Potassium: 4.2 mmol/L (ref 3.5–5.2)
Sodium: 142 mmol/L (ref 134–144)
Total Protein: 7.1 g/dL (ref 6.0–8.5)

## 2019-07-10 LAB — TSH: TSH: 1.14 u[IU]/mL (ref 0.450–4.500)

## 2019-07-10 LAB — LIPID PANEL
Chol/HDL Ratio: 5.3 ratio — ABNORMAL HIGH (ref 0.0–5.0)
Cholesterol, Total: 207 mg/dL — ABNORMAL HIGH (ref 100–199)
HDL: 39 mg/dL — ABNORMAL LOW (ref >39)
LDL Chol Calc (NIH): 148 mg/dL — ABNORMAL HIGH (ref 0–99)
Triglycerides: 108 mg/dL (ref 0–149)
VLDL Cholesterol Cal: 20 mg/dL (ref 5–40)

## 2019-07-10 LAB — PSA: Prostate Specific Ag, Serum: 0.6 ng/mL (ref 0.0–4.0)

## 2019-07-10 NOTE — Telephone Encounter (Signed)
Called and spoke with patient about normal labs. Patient gave verbal understanding.

## 2019-07-10 NOTE — Telephone Encounter (Signed)
-----   Message from Jerrol Banana., MD sent at 07/10/2019  8:56 AM EST ----- Labs ok.

## 2019-07-20 ENCOUNTER — Telehealth: Payer: Self-pay | Admitting: Family Medicine

## 2019-07-20 NOTE — Telephone Encounter (Signed)
FYI

## 2019-07-20 NOTE — Telephone Encounter (Signed)
Hassan Rowan with [facility] calling.  States that   diazepam (VALIUM) 5 MG tablet    has been approved for one year (through 07/19/2020).

## 2019-07-28 ENCOUNTER — Other Ambulatory Visit: Payer: Self-pay

## 2019-07-28 DIAGNOSIS — Z20822 Contact with and (suspected) exposure to covid-19: Secondary | ICD-10-CM

## 2019-07-29 ENCOUNTER — Encounter: Payer: Self-pay | Admitting: Family Medicine

## 2019-07-30 LAB — NOVEL CORONAVIRUS, NAA: SARS-CoV-2, NAA: NOT DETECTED

## 2019-07-31 ENCOUNTER — Telehealth: Payer: Self-pay | Admitting: *Deleted

## 2019-07-31 NOTE — Telephone Encounter (Signed)
Patient called for results said he has viewed them on my chart .

## 2019-09-05 ENCOUNTER — Other Ambulatory Visit: Payer: Self-pay | Admitting: Family Medicine

## 2019-09-05 DIAGNOSIS — F32A Depression, unspecified: Secondary | ICD-10-CM

## 2019-09-05 DIAGNOSIS — F329 Major depressive disorder, single episode, unspecified: Secondary | ICD-10-CM

## 2019-09-17 ENCOUNTER — Telehealth: Payer: Self-pay | Admitting: Family Medicine

## 2019-09-17 NOTE — Telephone Encounter (Signed)
Patient came by office to ask for advice on his Cpap.  He thinks he has had it about 10 years and now its making a horrible noise.  Do you know if he can get it fixed or should he get a prescription to get a new machine since its that old?

## 2019-09-17 NOTE — Telephone Encounter (Signed)
Please advise 

## 2019-09-18 NOTE — Telephone Encounter (Signed)
Ok to write Rx for CPAP

## 2019-09-20 ENCOUNTER — Encounter: Payer: Self-pay | Admitting: Family Medicine

## 2019-09-21 ENCOUNTER — Encounter: Payer: Self-pay | Admitting: Family Medicine

## 2019-09-21 NOTE — Telephone Encounter (Signed)
Rx has been written.

## 2019-09-22 NOTE — Telephone Encounter (Signed)
Patient is going to cb with info on which home health company he is going to use.

## 2019-09-27 ENCOUNTER — Encounter: Payer: Self-pay | Admitting: Family Medicine

## 2019-09-28 NOTE — Telephone Encounter (Signed)
Order has been faxed to [facility].

## 2019-09-28 NOTE — Telephone Encounter (Addendum)
Pt calling back with the information where to send the Rx for his CPAP. His new insurance is [employer] (HMO). Member ID: [id number]. Plan [phone]) [id number].   Pt states he has spoke with [facility]. I was advised that the following is needed for their records:  chart info regarding sleep study, prescription information. Given the study result, Dr. Alben Spittle prescription   fax #:  [phone]; email address:  [email or web address]  Pt does states his sleep study was years ago  Pt is frustrated this has not been done, as he been trying to get this done since November. Insurance updated in [place]. Please call pt and let him know something.  Pt states he has not been able to sleep in weeks!

## 2019-09-30 ENCOUNTER — Encounter: Payer: Self-pay | Admitting: Family Medicine

## 2019-10-03 ENCOUNTER — Encounter: Payer: Self-pay | Admitting: Family Medicine

## 2019-10-05 ENCOUNTER — Other Ambulatory Visit: Payer: Self-pay | Admitting: *Deleted

## 2019-10-05 DIAGNOSIS — G4733 Obstructive sleep apnea (adult) (pediatric): Secondary | ICD-10-CM

## 2019-10-05 NOTE — Telephone Encounter (Signed)
Split night was ordered.

## 2019-10-05 NOTE — Progress Notes (Signed)
Please schedule split night sleep study. Thank you.

## 2019-10-06 ENCOUNTER — Telehealth: Payer: Self-pay | Admitting: Family Medicine

## 2019-10-06 NOTE — Telephone Encounter (Signed)
Tiffany from [facility] calling with questions about orders for CPAP supplies.  She needs additional information.

## 2019-10-07 ENCOUNTER — Encounter: Payer: Self-pay | Admitting: Family Medicine

## 2019-10-07 NOTE — Telephone Encounter (Addendum)
Returned call to [employer]. Called twice and have not been able to speak to anyone yet. Will try again.

## 2019-10-12 ENCOUNTER — Encounter: Payer: Self-pay | Admitting: Family Medicine

## 2019-10-12 NOTE — Progress Notes (Signed)
In order to get insurance company to pay for sleep study there needs to be documentation as to why he is in need of it in office note

## 2019-10-13 NOTE — Telephone Encounter (Signed)
Scheduled ov with pt.

## 2019-10-13 NOTE — Progress Notes (Signed)
Appt scheduled for in office visit.

## 2019-10-14 NOTE — Progress Notes (Signed)
Patient: Louis Luna Male    DOB: Aug 23, 1951   69 y.o.   MRN: [id number] Visit Date: 10/19/2019  Today's Provider: [name], MD   Chief Complaint  Patient presents with  . Sleep Apnea    CPAP machine   Subjective:     HPI   Patient is here for person office visit for CPAP orders.  We have been trying to work on this for a couple of months.  His machine which he has had for greater than 10 years is making loud noises at night. His pressure is about 10 cm of water and I think he needs an auto titrating CPAP.  No Known Allergies   Current Outpatient Medications:  .  aspirin EC 81 MG tablet, Take 81 mg by mouth daily., Disp: , Rfl:  .  atenolol (TENORMIN) 50 MG tablet, Take 1 tablet (50 mg total) by mouth daily., Disp: 90 tablet, Rfl: 3 .  ciprofloxacin (CILOXAN) 0.3 % ophthalmic solution, 1 drop, every 4 hours, while awake, for the next 5 days. (Patient not taking: Reported on 07/09/2019), Disp: 5 mL, Rfl: 0 .  diazepam (VALIUM) 5 MG tablet, TAKE 1 TABLET BY MOUTH ONCE DAILY IF NEEDED FOR ANXIETY, Disp: 30 tablet, Rfl: 4 .  Docusate Calcium (STOOL SOFTENER PO), Take by mouth., Disp: , Rfl:  .  Doxylamine Succinate, Sleep, (SLEEP AID PO), Take by mouth., Disp: , Rfl:  .  glucosamine-chondroitin 500-400 MG tablet, Take 1 tablet by mouth daily., Disp: , Rfl:  .  ibuprofen (ADVIL,MOTRIN) 800 MG tablet, Take 1 tablet (800 mg total) by mouth every 8 (eight) hours as needed for moderate pain., Disp: 15 tablet, Rfl: 0 .  Multiple Vitamin (MULTIVITAMIN WITH MINERALS) TABS tablet, Take 1 tablet by mouth daily., Disp: , Rfl:  .  psyllium (METAMUCIL SMOOTH TEXTURE) 28 % packet, Take 1 packet by mouth daily., Disp: , Rfl:  .  saw palmetto 80 MG capsule, Take 80 mg by mouth 2 (two) times daily., Disp: , Rfl:  .  sertraline (ZOLOFT) 50 MG tablet, Take 1 tablet (50 mg total) by mouth daily., Disp: 90 tablet, Rfl: 3 .  sertraline (ZOLOFT) 50 MG tablet, TAKE 1 TABLET BY MOUTH  EVERY DAY, Disp: 60 tablet, Rfl: 5  Review of Systems  Constitutional: Negative for appetite change, chills and fever.  Respiratory: Negative for chest tightness, shortness of breath and wheezing.   Cardiovascular: Negative for chest pain and palpitations.  Gastrointestinal: Negative for abdominal pain, nausea and vomiting.    Social History   Tobacco Use  . Smoking status: Former Smoker    Packs/day: 1.00    Years: 30.00    Pack years: 30.00    Types: Cigarettes    Quit date: 10/26/2011    Years since quitting: 7.9  . Smokeless tobacco: Never Used  Substance Use Topics  . Alcohol use: Yes    Comment: ocassionally-beer      Objective:   There were no vitals taken for this visit. There were no vitals filed for this visit.There is no height or weight on file to calculate BMI.   Physical Exam   No results found for any visits on 10/19/19.     Assessment & Plan    1. Obstructive sleep apnea We will try to order auto titrating CPAP.  Otherwise he needs a split-night study at the most.  He has documented OSA.  Would be happy just to get a new machine  at 10 cm of water.  May admit having to refer him to a sleep specialist.  2. Essential hypertension I think it is frustrated with this and will recheck blood pressure in 2 months.  He will check at home.  3. Class 1 obesity due to excess calories with serious comorbidity and body mass index (BMI) of 32.0 to 32.9 in adult Patient to work on diet and exercise for both hypertension and sleep apnea.     Richard Cranford Mon, MD  [facility]

## 2019-10-15 ENCOUNTER — Ambulatory Visit: Payer: Self-pay | Admitting: Family Medicine

## 2019-10-19 ENCOUNTER — Ambulatory Visit (INDEPENDENT_AMBULATORY_CARE_PROVIDER_SITE_OTHER): Payer: Medicare HMO | Admitting: Family Medicine

## 2019-10-19 ENCOUNTER — Encounter: Payer: Self-pay | Admitting: Family Medicine

## 2019-10-19 ENCOUNTER — Other Ambulatory Visit: Payer: Self-pay

## 2019-10-19 VITALS — BP 154/97 | HR 94 | Temp 95.5°F | Wt 229.2 lb

## 2019-10-19 DIAGNOSIS — E6609 Other obesity due to excess calories: Secondary | ICD-10-CM

## 2019-10-19 DIAGNOSIS — G4733 Obstructive sleep apnea (adult) (pediatric): Secondary | ICD-10-CM

## 2019-10-19 DIAGNOSIS — I10 Essential (primary) hypertension: Secondary | ICD-10-CM

## 2019-10-19 DIAGNOSIS — Z6832 Body mass index (BMI) 32.0-32.9, adult: Secondary | ICD-10-CM

## 2019-10-23 ENCOUNTER — Telehealth: Payer: Self-pay

## 2019-10-23 DIAGNOSIS — Z1152 Encounter for screening for COVID-19: Secondary | ICD-10-CM

## 2019-10-23 NOTE — Telephone Encounter (Signed)
Copied from [place] [id number]. Topic: General - Inquiry >> Oct 23, 2019  3:21 PM Reyne Dumas L wrote: Reason for CRM:   Pt wants to know what is going on with his CPAP machine

## 2019-10-25 ENCOUNTER — Ambulatory Visit: Payer: Medicare HMO | Attending: Internal Medicine

## 2019-10-25 DIAGNOSIS — Z23 Encounter for immunization: Secondary | ICD-10-CM | POA: Insufficient documentation

## 2019-10-25 NOTE — Progress Notes (Signed)
   Covid-19 Vaccination Clinic  Name:  Louis Luna    MRN: [id number] DOB: 11-01-1950  10/25/2019  [name] was observed post Covid-19 immunization for 15 minutes without incidence. He was provided with Vaccine Information Sheet and instruction to access the V-Safe system.   [name] was instructed to call 911 with any severe reactions post vaccine: Marland Kitchen Difficulty breathing  . Swelling of your face and throat  . A fast heartbeat  . A bad rash all over your body  . Dizziness and weakness    Immunizations Administered    Name Date Dose VIS Date Route   Pfizer COVID-19 Vaccine 10/25/2019  8:48 AM 0.3 mL 08/07/2019 Intramuscular   Manufacturer: [place]   Lot: [id number]   [place]: [id number]

## 2019-10-26 NOTE — Telephone Encounter (Signed)
Returned call to pt.

## 2019-10-30 ENCOUNTER — Other Ambulatory Visit
Admission: RE | Admit: 2019-10-30 | Discharge: 2019-10-30 | Disposition: A | Discharge status: Home / Self Care | Payer: Medicare HMO | Source: Ambulatory Visit | Attending: Family Medicine | Admitting: Family Medicine

## 2019-10-30 ENCOUNTER — Other Ambulatory Visit: Payer: Self-pay

## 2019-10-30 DIAGNOSIS — Z01812 Encounter for preprocedural laboratory examination: Secondary | ICD-10-CM | POA: Insufficient documentation

## 2019-10-30 DIAGNOSIS — Z20822 Contact with and (suspected) exposure to covid-19: Secondary | ICD-10-CM | POA: Diagnosis not present

## 2019-10-31 LAB — SARS CORONAVIRUS 2 (TAT 6-24 HRS): SARS Coronavirus 2: NEGATIVE

## 2019-11-03 ENCOUNTER — Telehealth: Payer: Self-pay

## 2019-11-03 NOTE — Telephone Encounter (Signed)
Returned patient's call, LVMTCB.  °

## 2019-11-03 NOTE — Telephone Encounter (Signed)
Copied from [place] [id number]. Topic: General - Other >> Nov 03, 2019  1:39 PM Mcneil, Ja-Kwan wrote: Reason for CRM: Pt stated he has a sleep study appt scheduled for tonight at 8:30 pm but he received a call stating they have not received approval from Dr. Rosanna Randy. Pt is very upset because he said this has been going on since November and he requests that the approval is sent immediately so he does not have to reschedule the appt. Pt requests call back. Cb# [id number] >> Nov 03, 2019  2:01 PM Yvette Rack wrote: Vella Raring with Sleep Study called for a prior authorization.

## 2019-11-03 NOTE — Telephone Encounter (Signed)
Pt calling in reference to this.  PER PEC:  Pt is returning a phone call.  He states he is going to drive by to see who called him.   Thanks,   -Mickel Baas

## 2019-11-04 NOTE — Telephone Encounter (Signed)
Completed.

## 2019-11-10 ENCOUNTER — Encounter: Payer: Self-pay | Admitting: Family Medicine

## 2019-11-11 ENCOUNTER — Other Ambulatory Visit: Payer: Self-pay | Admitting: *Deleted

## 2019-11-11 DIAGNOSIS — G4733 Obstructive sleep apnea (adult) (pediatric): Secondary | ICD-10-CM

## 2019-11-18 ENCOUNTER — Ambulatory Visit: Payer: Medicare HMO | Attending: Internal Medicine

## 2019-11-18 DIAGNOSIS — Z23 Encounter for immunization: Secondary | ICD-10-CM

## 2019-11-18 NOTE — Progress Notes (Signed)
   Covid-19 Vaccination Clinic  Name:  Louis Luna    MRN: [id number] DOB: Jun 11, 1951  11/18/2019  Louis Luna was observed post Covid-19 immunization for 15 minutes without incident. He was provided with Vaccine Information Sheet and instruction to access the V-Safe system.   Louis Luna was instructed to call 911 with any severe reactions post vaccine: Marland Kitchen Difficulty breathing  . Swelling of face and throat  . A fast heartbeat  . A bad rash all over body  . Dizziness and weakness   Immunizations Administered    Name Date Dose VIS Date Route   Pfizer COVID-19 Vaccine 11/18/2019  8:26 AM 0.3 mL 08/07/2019 Intramuscular   Manufacturer: [place]   Lot: [id number]   [place]: [id number]

## 2019-11-25 ENCOUNTER — Telehealth: Payer: Self-pay | Admitting: Family Medicine

## 2019-11-25 NOTE — Telephone Encounter (Signed)
Pt called and will have sleep study first and he is aware need needs to schedule annual wellness visit pt did not want to schedule yet

## 2019-11-25 NOTE — Telephone Encounter (Signed)
Left message for patient to schedule Annual Wellness Visit.  Please schedule with Nurse Health Advisor Victoria Britt, RN at [place]  

## 2019-11-26 ENCOUNTER — Ambulatory Visit: Payer: Medicare HMO | Attending: Otolaryngology

## 2019-11-26 DIAGNOSIS — G4733 Obstructive sleep apnea (adult) (pediatric): Secondary | ICD-10-CM | POA: Diagnosis not present

## 2019-11-27 ENCOUNTER — Other Ambulatory Visit: Payer: Self-pay

## 2019-11-27 ENCOUNTER — Other Ambulatory Visit: Payer: Self-pay | Admitting: Family Medicine

## 2019-12-22 ENCOUNTER — Other Ambulatory Visit: Payer: Self-pay | Admitting: *Deleted

## 2019-12-29 DIAGNOSIS — G4733 Obstructive sleep apnea (adult) (pediatric): Secondary | ICD-10-CM | POA: Diagnosis not present

## 2019-12-31 NOTE — Progress Notes (Deleted)
     Established patient visit   Patient: Louis Luna   DOB: Jul 29, 1951   69 y.o. Adult  MRN: [id number] Visit Date: 01/06/2020  Today's healthcare provider: [name], MD   No chief complaint on file.  Subjective    HPI ***  {Show patient history (optional):23778::" "}   Medications: Outpatient Medications Prior to Visit  Medication Sig  . aspirin EC 81 MG tablet Take 81 mg by mouth daily.  Marland Kitchen atenolol (TENORMIN) 50 MG tablet TAKE 1 TABLET BY MOUTH EVERY DAY  . ciprofloxacin (CILOXAN) 0.3 % ophthalmic solution 1 drop, every 4 hours, while awake, for the next 5 days. (Patient not taking: Reported on 07/09/2019)  . diazepam (VALIUM) 5 MG tablet TAKE 1 TABLET BY MOUTH ONCE DAILY IF NEEDED FOR ANXIETY  . Docusate Calcium (STOOL SOFTENER PO) Take by mouth.  . Doxylamine Succinate, Sleep, (SLEEP AID PO) Take by mouth.  Marland Kitchen glucosamine-chondroitin 500-400 MG tablet Take 1 tablet by mouth daily.  Marland Kitchen ibuprofen (ADVIL,MOTRIN) 800 MG tablet Take 1 tablet (800 mg total) by mouth every 8 (eight) hours as needed for moderate pain.  . Multiple Vitamin (MULTIVITAMIN WITH MINERALS) TABS tablet Take 1 tablet by mouth daily.  . psyllium (METAMUCIL SMOOTH TEXTURE) 28 % packet Take 1 packet by mouth daily.  . saw palmetto 80 MG capsule Take 80 mg by mouth 2 (two) times daily.  . sertraline (ZOLOFT) 50 MG tablet Take 1 tablet (50 mg total) by mouth daily.  . sertraline (ZOLOFT) 50 MG tablet TAKE 1 TABLET BY MOUTH EVERY DAY   No facility-administered medications prior to visit.    Review of Systems  Constitutional: Negative for appetite change, chills and fever.  Respiratory: Negative for chest tightness, shortness of breath and wheezing.   Cardiovascular: Negative for chest pain and palpitations.  Gastrointestinal: Negative for abdominal pain, nausea and vomiting.    {Show previous labs (optional):23779::" "}  Objective    There were no vitals taken for this visit. {Show  previous vital signs (optional):23777::" "}  Physical Exam  ***  No results found for any visits on 01/06/20.  Assessment & Plan     ***  No follow-ups on file.      {provider attestation***:1}   [name], MD  [facility] [phone] (phone) [phone] (fax)  [place]

## 2020-01-06 ENCOUNTER — Ambulatory Visit: Payer: Self-pay | Admitting: Family Medicine

## 2020-01-11 NOTE — Progress Notes (Signed)
Established patient visit  I,Louis Luna,acting as a scribe for [name], MD.,have documented all relevant documentation on the behalf of [name], MD,as directed by  [name], MD while in the presence of [name], MD.   Patient: Louis Luna   DOB: 12/12/1950   69 y.o. Adult  MRN: [id number] Visit Date: 01/18/2020  Today's healthcare provider: [name], MD   Chief Complaint  Patient presents with  . Follow-up  . Hypertension   Subjective    HPI Patient has been feeling fairly well.  He is sleeping well with this new CPAP that he has had for 3 weeks.  He is using it all night long. Hypertension, follow-up  BP Readings from Last 3 Encounters:  01/18/20 (!) 153/91  10/19/19 (!) 154/97  07/09/19 (!) 149/84   Wt Readings from Last 3 Encounters:  01/18/20 225 lb (102.1 kg)  10/19/19 229 lb 3.2 oz (104 kg)  07/09/19 218 lb (98.9 kg)     He was last seen for hypertension 3 months ago.  BP at that visit was 154/97. Management since that visit includes; advised to check bp at home and follow up in 2 months. He reports good compliance with treatment. He is not having side effects. none He is not exercising. He is adherent to low salt diet.   Outside blood pressures are checks occasionally.  He does not smoke.  Use of agents associated with hypertension: none.   --------------------------------------------------------------------      Medications: Outpatient Medications Prior to Visit  Medication Sig  . aspirin EC 81 MG tablet Take 81 mg by mouth daily.  Marland Kitchen atenolol (TENORMIN) 50 MG tablet TAKE 1 TABLET BY MOUTH EVERY DAY  . diazepam (VALIUM) 5 MG tablet TAKE 1 TABLET BY MOUTH ONCE DAILY IF NEEDED FOR ANXIETY  . Docusate Calcium (STOOL SOFTENER PO) Take by mouth.  . Doxylamine Succinate, Sleep, (SLEEP AID PO) Take by mouth.  Marland Kitchen glucosamine-chondroitin 500-400 MG tablet Take 1 tablet by mouth daily.  Marland Kitchen ibuprofen  (ADVIL,MOTRIN) 800 MG tablet Take 1 tablet (800 mg total) by mouth every 8 (eight) hours as needed for moderate pain.  . Multiple Vitamin (MULTIVITAMIN WITH MINERALS) TABS tablet Take 1 tablet by mouth daily.  . psyllium (METAMUCIL SMOOTH TEXTURE) 28 % packet Take 1 packet by mouth daily.  . saw palmetto 80 MG capsule Take 80 mg by mouth 2 (two) times daily.  . sertraline (ZOLOFT) 50 MG tablet Take 1 tablet (50 mg total) by mouth daily.  . sertraline (ZOLOFT) 50 MG tablet TAKE 1 TABLET BY MOUTH EVERY DAY  . ciprofloxacin (CILOXAN) 0.3 % ophthalmic solution 1 drop, every 4 hours, while awake, for the next 5 days. (Patient not taking: Reported on 07/09/2019)   No facility-administered medications prior to visit.    Review of Systems  Constitutional: Negative for appetite change, chills and fever.  HENT: Negative.   Eyes: Negative.   Respiratory: Negative for chest tightness, shortness of breath and wheezing.   Cardiovascular: Negative for chest pain and palpitations.  Gastrointestinal: Negative for abdominal pain, nausea and vomiting.  Endocrine: Negative.   Skin: Negative.   Allergic/Immunologic: Negative.   Neurological: Negative.   Psychiatric/Behavioral: Negative.        Objective    BP (!) 153/91 (BP Location: Left Arm, Cuff Size: Large)   Pulse 97   Temp (!) 96.9 F (36.1 C) (Other (Comment))   Resp 18   Ht 5'  9" (1.753 m)   Wt 225 lb (102.1 kg)   SpO2 96%   BMI 33.23 kg/m  BP Readings from Last 3 Encounters:  01/18/20 (!) 153/91  10/19/19 (!) 154/97  07/09/19 (!) 149/84   Wt Readings from Last 3 Encounters:  01/18/20 225 lb (102.1 kg)  10/19/19 229 lb 3.2 oz (104 kg)  07/09/19 218 lb (98.9 kg)      Physical Exam Vitals reviewed.  HENT:     Head: Normocephalic and atraumatic.     Right Ear: External ear normal.     Left Ear: External ear normal.  Eyes:     General: No scleral icterus. Cardiovascular:     Rate and Rhythm: Normal rate and regular rhythm.       Pulses: Normal pulses.     Heart sounds: Normal heart sounds.  Pulmonary:     Effort: Pulmonary effort is normal.     Breath sounds: Normal breath sounds.  Abdominal:     Palpations: Abdomen is soft.  Skin:    General: Skin is warm and dry.     Comments: Multiple lipomas, especially on forearms.  Neurological:     General: No focal deficit present.     Mental Status: He is alert and oriented to person, place, and time.  Psychiatric:        Mood and Affect: Mood normal.        Behavior: Behavior normal.        Thought Content: Thought content normal.        Judgment: Judgment normal.        No results found for any visits on 01/18/20.  Assessment & Plan     1. Essential hypertension Added amlodipine 5 mg daily for better control.  Turn to clinic 2 months. - amLODipine (NORVASC) 5 MG tablet; Take 1 tablet (5 mg total) by mouth daily.  Dispense: 30 tablet; Refill: 2  2. Obstructive sleep apnea On CPAP. 3.  Lipomas  Return in 9 weeks (on 03/21/2020).      I, [name], MD, have reviewed all documentation for this visit. The documentation on 01/18/20 for the exam, diagnosis, procedures, and orders are all accurate and complete.    Anthony Roland Cranford Mon, MD  [place] [phone] (phone) [phone] (fax)  [place]

## 2020-01-18 ENCOUNTER — Encounter: Payer: Self-pay | Admitting: Family Medicine

## 2020-01-18 ENCOUNTER — Other Ambulatory Visit: Payer: Self-pay

## 2020-01-18 ENCOUNTER — Ambulatory Visit (INDEPENDENT_AMBULATORY_CARE_PROVIDER_SITE_OTHER): Payer: Medicare HMO | Admitting: Family Medicine

## 2020-01-18 VITALS — BP 153/91 | HR 97 | Temp 96.9°F | Resp 18 | Ht 69.0 in | Wt 225.0 lb

## 2020-01-18 DIAGNOSIS — I10 Essential (primary) hypertension: Secondary | ICD-10-CM

## 2020-01-18 DIAGNOSIS — G4733 Obstructive sleep apnea (adult) (pediatric): Secondary | ICD-10-CM

## 2020-01-18 MED ORDER — AMLODIPINE BESYLATE 5 MG PO TABS: 5.0000 mg | ORAL_TABLET | Freq: Every day | ORAL | 2 refills | Status: DC

## 2020-01-29 DIAGNOSIS — G4733 Obstructive sleep apnea (adult) (pediatric): Secondary | ICD-10-CM | POA: Diagnosis not present

## 2020-02-09 DIAGNOSIS — G4733 Obstructive sleep apnea (adult) (pediatric): Secondary | ICD-10-CM | POA: Diagnosis not present

## 2020-02-28 DIAGNOSIS — G4733 Obstructive sleep apnea (adult) (pediatric): Secondary | ICD-10-CM | POA: Diagnosis not present

## 2020-03-16 NOTE — Progress Notes (Signed)
Established patient visit  I,Louis Luna,acting as a scribe for Louis Durie, MD.,have documented all relevant documentation on the behalf of Louis Durie, MD,as directed by  Louis Durie, MD while in the presence of Louis Durie, MD.   Patient: Louis Luna   DOB: 09-02-1950   69 y.o. Adult  MRN: [id number] Visit Date: 03/21/2020  Today's healthcare provider: [name], MD   Chief Complaint  Patient presents with  . Follow-up  . Hypertension   Subjective    HPI  Patient feels well and has no complaints. Hypertension, follow-up  BP Readings from Last 3 Encounters:  03/21/20 (!) 145/88  01/18/20 (!) 153/91  10/19/19 (!) 154/97   Wt Readings from Last 3 Encounters:  03/21/20 (!) 230 lb (104.3 kg)  01/18/20 225 lb (102.1 kg)  10/19/19 229 lb 3.2 oz (104 kg)     He was last seen for hypertension 2 months ago.  BP at that visit was 153/91. Management since that visit includes; Added amlodipine 5 mg daily for better control.  Turn to clinic 2 months. He reports good compliance with treatment. He is not having side effects. none He is exercising. He is adherent to low salt diet.   Outside blood pressures are 130/89.  He does not smoke.  Use of agents associated with hypertension: none.   --------------------------------------------------------------------  Patient stated he took a couple of the amlodipine until he read the side effects and stop taking it.     Medications: Outpatient Medications Prior to Visit  Medication Sig  . aspirin EC 81 MG tablet Take 81 mg by mouth daily.  Marland Kitchen atenolol (TENORMIN) 50 MG tablet TAKE 1 TABLET BY MOUTH EVERY DAY  . diazepam (VALIUM) 5 MG tablet TAKE 1 TABLET BY MOUTH ONCE DAILY IF NEEDED FOR ANXIETY  . Docusate Calcium (STOOL SOFTENER PO) Take by mouth.  . Doxylamine Succinate, Sleep, (SLEEP AID PO) Take by mouth.  Marland Kitchen glucosamine-chondroitin 500-400 MG tablet Take 1 tablet by mouth daily.    Marland Kitchen ibuprofen (ADVIL,MOTRIN) 800 MG tablet Take 1 tablet (800 mg total) by mouth every 8 (eight) hours as needed for moderate pain.  . Multiple Vitamin (MULTIVITAMIN WITH MINERALS) TABS tablet Take 1 tablet by mouth daily.  . psyllium (METAMUCIL SMOOTH TEXTURE) 28 % packet Take 1 packet by mouth daily.  . saw palmetto 80 MG capsule Take 80 mg by mouth 2 (two) times daily.  . sertraline (ZOLOFT) 50 MG tablet Take 1 tablet (50 mg total) by mouth daily.  . sertraline (ZOLOFT) 50 MG tablet TAKE 1 TABLET BY MOUTH EVERY DAY  . amLODipine (NORVASC) 5 MG tablet Take 1 tablet (5 mg total) by mouth daily. (Patient not taking: Reported on 03/21/2020)  . ciprofloxacin (CILOXAN) 0.3 % ophthalmic solution 1 drop, every 4 hours, while awake, for the next 5 days. (Patient not taking: Reported on 07/09/2019)   No facility-administered medications prior to visit.    Review of Systems  Constitutional: Negative for appetite change, chills and fever.  Respiratory: Negative for chest tightness, shortness of breath and wheezing.   Cardiovascular: Negative for chest pain and palpitations.  Gastrointestinal: Negative for abdominal pain, nausea and vomiting.       Objective    BP (!) 145/88 (BP Location: Right Arm, Patient Position: Sitting, Cuff Size: Large)   Pulse 76   Temp (!) 96.6 F (35.9 C) (Other (Comment))   Resp 18   Ht 5\' 9"  (1.753 m)  Wt (!) 230 lb (104.3 kg)   SpO2 96%   BMI 33.97 kg/m  BP Readings from Last 3 Encounters:  03/21/20 (!) 145/88  01/18/20 (!) 153/91  10/19/19 (!) 154/97   Wt Readings from Last 3 Encounters:  03/21/20 (!) 230 lb (104.3 kg)  01/18/20 225 lb (102.1 kg)  10/19/19 229 lb 3.2 oz (104 kg)      Physical Exam  BP (!) 145/88 (BP Location: Right Arm, Patient Position: Sitting, Cuff Size: Large)   Pulse 76   Temp (!) 96.6 F (35.9 C) (Other (Comment))   Resp 18   Ht 5\' 9"  (1.753 m)   Wt (!) 230 lb (104.3 kg)   SpO2 96%   BMI 33.97 kg/m   General  Appearance:    Alert, cooperative, no distress, appears stated age  Head:    Normocephalic, without obvious abnormality, atraumatic  Eyes:    PERRL, conjunctiva/corneas clear, EOM's intact, fundi    benign, both eyes       Ears:    Normal TM's and external ear canals, both ears  Nose:   Nares normal, septum midline, mucosa normal, no drainage   or sinus tenderness  Throat:   Lips, mucosa, and tongue normal; teeth and gums normal  Neck:   Supple, symmetrical, trachea midline, no adenopathy;       thyroid:  No enlargement/tenderness/nodules; no carotid   bruit or JVD  Back:     Symmetric, no curvature, ROM normal, no CVA tenderness  Lungs:     Clear to auscultation bilaterally, respirations unlabored  Chest wall:    No tenderness or deformity  Heart:    Regular rate and rhythm, S1 and S2 normal, no murmur, rub   or gallop  Abdomen:     Soft, non-tender, bowel sounds active all four quadrants,    no masses, no organomegaly  Genitalia:    Normal male without lesion, discharge or tenderness  Rectal:    Normal tone, normal prostate, no masses or tenderness;   guaiac negative stool  Extremities:   Extremities normal, atraumatic, no cyanosis or edema  Pulses:   2+ and symmetric all extremities  Skin:   Skin color, texture, turgor normal, no rashes or lesions  Lymph nodes:   Cervical, supraclavicular, and axillary nodes normal  Neurologic:   CNII-XII intact. Normal strength, sensation and reflexes      throughout     No results found for any visits on 03/21/20.  Assessment & Plan     Problem List Items Addressed This Visit      Cardiovascular and Mediastinum   BP (high blood pressure) - Primary    On atenolol and amlodipine.  Patient to get blood pressure cuff for home.        Respiratory   Obstructive sleep apnea    Uses CPAP nightly with good benefit        Other   Clinical depression    In remission on sertraline.        Follow-up 3 to 4 months with home blood  pressure readings   Return in about 3 months (around 06/21/2020).         Arville Postlewaite Cranford Mon, MD  [facility] [phone] (phone) [phone] (fax)  [place]

## 2020-03-21 ENCOUNTER — Encounter: Payer: Self-pay | Admitting: Family Medicine

## 2020-03-21 ENCOUNTER — Ambulatory Visit (INDEPENDENT_AMBULATORY_CARE_PROVIDER_SITE_OTHER): Payer: Medicare HMO | Admitting: Family Medicine

## 2020-03-21 ENCOUNTER — Other Ambulatory Visit: Payer: Self-pay

## 2020-03-21 VITALS — BP 145/88 | HR 76 | Temp 96.6°F | Resp 18 | Ht 69.0 in | Wt 230.0 lb

## 2020-03-21 DIAGNOSIS — F329 Major depressive disorder, single episode, unspecified: Secondary | ICD-10-CM | POA: Diagnosis not present

## 2020-03-21 DIAGNOSIS — I10 Essential (primary) hypertension: Secondary | ICD-10-CM

## 2020-03-21 DIAGNOSIS — G4733 Obstructive sleep apnea (adult) (pediatric): Secondary | ICD-10-CM

## 2020-03-21 NOTE — Patient Instructions (Signed)
For blood pressure: Get an [employer] or [employer] blood pressure cuff.

## 2020-03-25 NOTE — Assessment & Plan Note (Signed)
On atenolol and amlodipine.  Patient to get blood pressure cuff for home.

## 2020-03-25 NOTE — Assessment & Plan Note (Signed)
In remission on sertraline.

## 2020-03-25 NOTE — Assessment & Plan Note (Signed)
Uses CPAP nightly with good benefit

## 2020-03-27 ENCOUNTER — Encounter: Payer: Self-pay | Admitting: Family Medicine

## 2020-03-28 DIAGNOSIS — G4733 Obstructive sleep apnea (adult) (pediatric): Secondary | ICD-10-CM | POA: Diagnosis not present

## 2020-03-30 DIAGNOSIS — G4733 Obstructive sleep apnea (adult) (pediatric): Secondary | ICD-10-CM | POA: Diagnosis not present

## 2020-04-04 ENCOUNTER — Telehealth: Payer: Self-pay | Admitting: Family Medicine

## 2020-04-04 NOTE — Telephone Encounter (Signed)
Copied from [place] [phone]. Topic: Medicare AWV >> Apr 04, 2020  3:43 PM Cher Nakai R wrote: Reason for CRM:  Left message for patient to call back and schedule Medicare Annual Wellness Visit (AWV) either virtually or in office.  Last AWV 03/13/2017  Please schedule at anytime with [facility] Health Advisor.

## 2020-04-15 ENCOUNTER — Other Ambulatory Visit: Payer: Self-pay | Admitting: Family Medicine

## 2020-04-15 DIAGNOSIS — I10 Essential (primary) hypertension: Secondary | ICD-10-CM

## 2020-04-29 ENCOUNTER — Ambulatory Visit: Payer: Self-pay

## 2020-04-30 DIAGNOSIS — G4733 Obstructive sleep apnea (adult) (pediatric): Secondary | ICD-10-CM | POA: Diagnosis not present

## 2020-05-30 DIAGNOSIS — G4733 Obstructive sleep apnea (adult) (pediatric): Secondary | ICD-10-CM | POA: Diagnosis not present

## 2020-06-07 DIAGNOSIS — G4733 Obstructive sleep apnea (adult) (pediatric): Secondary | ICD-10-CM | POA: Diagnosis not present

## 2020-06-23 NOTE — Progress Notes (Signed)
Established patient visit   Patient: Louis Luna   DOB: 1950-09-18   69 y.o. Adult  MRN: [id number] Visit Date: 06/27/2020  Today's healthcare provider: [name], MD   Chief Complaint  Patient presents with  . Hypertension   Subjective    HPI  Patient doing well with hypertension and his pression.  Blood pressure is running a little high at home. He does state that for the past 2 months he has had night sweats about 2 times per week where he had to get up and take off his shirt.  Hypertension, follow-up  BP Readings from Last 3 Encounters:  06/27/20 (!) 153/95  03/21/20 (!) 145/88  01/18/20 (!) 153/91   Wt Readings from Last 3 Encounters:  06/27/20 235 lb (106.6 kg)  03/21/20 (!) 230 lb (104.3 kg)  01/18/20 225 lb (102.1 kg)     He was last seen for hypertension 3 months ago.  BP at that visit was 145/88. Management since that visit includes; On atenolol and amlodipine.  Patient to get blood pressure cuff for home. He reports good compliance with treatment. He is not having side effects.  He is exercising. He is adherent to low salt diet.   Outside blood pressures are checked occasionally. Averages 130/70s.   He does not smoke.  Use of agents associated with hypertension: none.   Depression, Follow-up  He  was last seen for this 3 months ago. Changes made at last visit include; In remission on sertraline.   He reports good compliance with treatment. He is not having side effects.   He reports good tolerance of treatment. He feels he is Unchanged since last visit.  Depression screen [facility] 2/9 06/27/2020 07/09/2019 03/13/2017  Decreased Interest 0 0 0  Down, Depressed, Hopeless 0 0 0  PHQ - 2 Score 0 0 0  Altered sleeping 0 0 0  Tired, decreased energy 1 0 0  Change in appetite _0 Feeling bad or failure about yourself  0 0 1  Trouble concentrating 0 1 0  Moving slowly or fidgety/restless 0 0 0  Suicidal thoughts 0 0 0  PHQ-9 Score _1 Difficult doing work/chores Not difficult at all Not difficult at all -          Medications: Outpatient Medications Prior to Visit  Medication Sig  . amLODipine (NORVASC) 5 MG tablet TAKE 1 TABLET(5 MG) BY MOUTH DAILY  . aspirin EC 81 MG tablet Take 81 mg by mouth daily.  Marland Kitchen atenolol (TENORMIN) 50 MG tablet TAKE 1 TABLET BY MOUTH EVERY DAY  . diazepam (VALIUM) 5 MG tablet TAKE 1 TABLET BY MOUTH ONCE DAILY IF NEEDED FOR ANXIETY  . Docusate Calcium (STOOL SOFTENER PO) Take by mouth.  . Doxylamine Succinate, Sleep, (SLEEP AID PO) Take by mouth.  Marland Kitchen glucosamine-chondroitin 500-400 MG tablet Take 1 tablet by mouth daily.  Marland Kitchen ibuprofen (ADVIL,MOTRIN) 800 MG tablet Take 1 tablet (800 mg total) by mouth every 8 (eight) hours as needed for moderate pain.  . Multiple Vitamin (MULTIVITAMIN WITH MINERALS) TABS tablet Take 1 tablet by mouth daily.  . psyllium (METAMUCIL SMOOTH TEXTURE) 28 % packet Take 1 packet by mouth daily.  . saw palmetto 80 MG capsule Take 80 mg by mouth 2 (two) times daily.  . sertraline (ZOLOFT) 50 MG tablet Take 1 tablet (50 mg total) by mouth daily.  . sertraline (ZOLOFT) 50 MG tablet TAKE 1 TABLET BY MOUTH EVERY  DAY  . ciprofloxacin (CILOXAN) 0.3 % ophthalmic solution 1 drop, every 4 hours, while awake, for the next 5 days. (Patient not taking: Reported on 07/09/2019)   No facility-administered medications prior to visit.    Review of Systems  Constitutional: Negative for appetite change, chills and fever.  Respiratory: Negative for chest tightness, shortness of breath and wheezing.   Cardiovascular: Negative for chest pain and palpitations.  Gastrointestinal: Negative for abdominal pain, nausea and vomiting.       Objective    BP (!) 153/95   Pulse 78   Temp 98.6 F (37 C)   Resp 16   Ht _0  (1.753 m)   Wt 235 lb (106.6 kg)   BMI 34.70 kg/m  BP Readings from Last 3 Encounters:  06/27/20 (!) 153/95  03/21/20 (!) 145/88  01/18/20 (!) 153/91   Wt  Readings from Last 3 Encounters:  06/27/20 235 lb (106.6 kg)  03/21/20 (!) 230 lb (104.3 kg)  01/18/20 225 lb (102.1 kg)      Physical Exam Vitals reviewed.  HENT:     Head: Normocephalic and atraumatic.     Right Ear: External ear normal.     Left Ear: External ear normal.  Eyes:     General: No scleral icterus. Cardiovascular:     Rate and Rhythm: Normal rate and regular rhythm.     Pulses: Normal pulses.     Heart sounds: Normal heart sounds.  Pulmonary:     Effort: Pulmonary effort is normal.     Breath sounds: Normal breath sounds.  Abdominal:     Palpations: Abdomen is soft.  Skin:    General: Skin is warm and dry.     Comments: Multiple lipomas, especially on forearms.  Neurological:     General: No focal deficit present.     Mental Status: He is alert and oriented to person, place, and time.  Psychiatric:        Mood and Affect: Mood normal.        Behavior: Behavior normal.        Thought Content: Thought content normal.        Judgment: Judgment normal.       No results found for any visits on 06/27/20.  Assessment & Plan     1. Essential hypertension  - CBC w/Diff/Platelet - Comprehensive Metabolic Panel (CMET) - TSH - Lipid panel - chlorthalidone (HYGROTON) 25 MG tablet; Take 1 tablet (25 mg total) by mouth every morning.  Dispense: 30 tablet; Refill: 1 - Sed Rate (ESR)  2. Reactive depression  - CBC w/Diff/Platelet - Comprehensive Metabolic Panel (CMET) - TSH - Lipid panel - Sed Rate (ESR)  3. Other hyperlipidemia  - CBC w/Diff/Platelet - Comprehensive Metabolic Panel (CMET) - TSH - Lipid panel - Sed Rate (ESR)  4. Primary hypertension Add chlorthalidone 25 mg every morning.  Return to clinic 1 month. - CBC w/Diff/Platelet - Comprehensive Metabolic Panel (CMET) - TSH - Lipid panel - Sed Rate (ESR)  5. Class 1 obesity due to excess calories with serious comorbidity and body mass index (BMI) of 32.0 to 32.9 in adult  - CBC  w/Diff/Platelet - Comprehensive Metabolic Panel (CMET) - TSH - Lipid panel - Sed Rate (ESR)  6. Night sweats Check labs, consider chest x-ray. - CBC w/Diff/Platelet - Comprehensive Metabolic Panel (CMET) - TSH - Lipid panel - Sed Rate (ESR)  7. Screening for tuberculosis Due to night sweats. - CBC w/Diff/Platelet - Comprehensive Metabolic Panel (CMET) -  TSH - Lipid panel - QuantiFERON-TB Gold Plus - Sed Rate (ESR)    Return in about 1 month (around 07/27/2020).         Maurina Fawaz Cranford Mon, MD  [facility] [phone] (phone) [phone] (fax)  [place]

## 2020-06-27 ENCOUNTER — Other Ambulatory Visit: Payer: Self-pay | Admitting: Family Medicine

## 2020-06-27 ENCOUNTER — Ambulatory Visit (INDEPENDENT_AMBULATORY_CARE_PROVIDER_SITE_OTHER): Payer: Medicare HMO | Admitting: Family Medicine

## 2020-06-27 ENCOUNTER — Encounter: Payer: Self-pay | Admitting: Family Medicine

## 2020-06-27 ENCOUNTER — Other Ambulatory Visit: Payer: Self-pay

## 2020-06-27 VITALS — BP 153/95 | HR 78 | Temp 98.6°F | Resp 16 | Ht 69.0 in | Wt 235.0 lb

## 2020-06-27 DIAGNOSIS — R61 Generalized hyperhidrosis: Secondary | ICD-10-CM

## 2020-06-27 DIAGNOSIS — F329 Major depressive disorder, single episode, unspecified: Secondary | ICD-10-CM | POA: Diagnosis not present

## 2020-06-27 DIAGNOSIS — E6609 Other obesity due to excess calories: Secondary | ICD-10-CM

## 2020-06-27 DIAGNOSIS — I10 Essential (primary) hypertension: Secondary | ICD-10-CM

## 2020-06-27 DIAGNOSIS — E66811 Obesity, class 1: Secondary | ICD-10-CM

## 2020-06-27 DIAGNOSIS — Z6832 Body mass index (BMI) 32.0-32.9, adult: Secondary | ICD-10-CM

## 2020-06-27 DIAGNOSIS — E7849 Other hyperlipidemia: Secondary | ICD-10-CM

## 2020-06-27 DIAGNOSIS — Z111 Encounter for screening for respiratory tuberculosis: Secondary | ICD-10-CM

## 2020-06-27 MED ORDER — CHLORTHALIDONE 25 MG PO TABS: 25.0000 mg | ORAL_TABLET | ORAL | 1 refills | Status: DC

## 2020-06-30 DIAGNOSIS — G4733 Obstructive sleep apnea (adult) (pediatric): Secondary | ICD-10-CM | POA: Diagnosis not present

## 2020-07-04 DIAGNOSIS — E7849 Other hyperlipidemia: Secondary | ICD-10-CM | POA: Diagnosis not present

## 2020-07-04 DIAGNOSIS — Z6832 Body mass index (BMI) 32.0-32.9, adult: Secondary | ICD-10-CM | POA: Diagnosis not present

## 2020-07-04 DIAGNOSIS — E6609 Other obesity due to excess calories: Secondary | ICD-10-CM | POA: Diagnosis not present

## 2020-07-04 DIAGNOSIS — F329 Major depressive disorder, single episode, unspecified: Secondary | ICD-10-CM | POA: Diagnosis not present

## 2020-07-04 DIAGNOSIS — R61 Generalized hyperhidrosis: Secondary | ICD-10-CM | POA: Diagnosis not present

## 2020-07-04 DIAGNOSIS — I10 Essential (primary) hypertension: Secondary | ICD-10-CM | POA: Diagnosis not present

## 2020-07-04 DIAGNOSIS — Z111 Encounter for screening for respiratory tuberculosis: Secondary | ICD-10-CM | POA: Diagnosis not present

## 2020-07-05 DIAGNOSIS — G4733 Obstructive sleep apnea (adult) (pediatric): Secondary | ICD-10-CM | POA: Diagnosis not present

## 2020-07-06 LAB — CBC WITH DIFFERENTIAL/PLATELET
Basophils Absolute: 0.1 10*3/uL (ref 0.0–0.2)
Basos: 2 %
EOS (ABSOLUTE): 0.2 10*3/uL (ref 0.0–0.4)
Eos: 4 %
Hematocrit: 46.7 % (ref 37.5–51.0)
Hemoglobin: 15.7 g/dL (ref 13.0–17.7)
Immature Grans (Abs): 0 10*3/uL (ref 0.0–0.1)
Immature Granulocytes: 0 %
Lymphocytes Absolute: 1.3 10*3/uL (ref 0.7–3.1)
Lymphs: 26 %
MCH: 29 pg (ref 26.6–33.0)
MCHC: 33.6 g/dL (ref 31.5–35.7)
MCV: 86 fL (ref 79–97)
Monocytes Absolute: 0.5 10*3/uL (ref 0.1–0.9)
Monocytes: 9 %
Neutrophils Absolute: 3.1 10*3/uL (ref 1.4–7.0)
Neutrophils: 59 %
Platelets: 200 10*3/uL (ref 150–450)
RBC: 5.41 x10E6/uL (ref 4.14–5.80)
RDW: 16.7 % — ABNORMAL HIGH (ref 11.6–15.4)
WBC: 5.2 10*3/uL (ref 3.4–10.8)

## 2020-07-06 LAB — LIPID PANEL
Chol/HDL Ratio: 6.3 ratio — ABNORMAL HIGH (ref 0.0–5.0)
Cholesterol, Total: 266 mg/dL — ABNORMAL HIGH (ref 100–199)
HDL: 42 mg/dL (ref >39)
LDL Chol Calc (NIH): 193 mg/dL — ABNORMAL HIGH (ref 0–99)
Triglycerides: 166 mg/dL — ABNORMAL HIGH (ref 0–149)
VLDL Cholesterol Cal: 31 mg/dL (ref 5–40)

## 2020-07-06 LAB — QUANTIFERON-TB GOLD PLUS
QuantiFERON Mitogen Value: 6.77 IU/mL
QuantiFERON Nil Value: 0.02 IU/mL
QuantiFERON TB1 Ag Value: 0.05 IU/mL
QuantiFERON TB2 Ag Value: 0.04 IU/mL
QuantiFERON-TB Gold Plus: NEGATIVE

## 2020-07-06 LAB — COMPREHENSIVE METABOLIC PANEL
ALT: 20 IU/L (ref 0–44)
AST: 22 IU/L (ref 0–40)
Albumin/Globulin Ratio: 1.7 (ref 1.2–2.2)
Albumin: 4.4 g/dL (ref 3.8–4.8)
Alkaline Phosphatase: 128 IU/L — ABNORMAL HIGH (ref 44–121)
BUN/Creatinine Ratio: 16 (ref 10–24)
BUN: 15 mg/dL (ref 8–27)
Bilirubin Total: 0.8 mg/dL (ref 0.0–1.2)
CO2: 22 mmol/L (ref 20–29)
Calcium: 9.8 mg/dL (ref 8.6–10.2)
Chloride: 103 mmol/L (ref 96–106)
Creatinine, Ser: 0.95 mg/dL (ref 0.76–1.27)
GFR calc Af Amer: 93 mL/min/{1.73_m2} (ref >59)
GFR calc non Af Amer: 81 mL/min/{1.73_m2} (ref >59)
Globulin, Total: 2.6 g/dL (ref 1.5–4.5)
Glucose: 102 mg/dL — ABNORMAL HIGH (ref 65–99)
Potassium: 4.5 mmol/L (ref 3.5–5.2)
Sodium: 141 mmol/L (ref 134–144)
Total Protein: 7 g/dL (ref 6.0–8.5)

## 2020-07-06 LAB — SEDIMENTATION RATE: Sed Rate: 9 mm/hr (ref 0–30)

## 2020-07-06 LAB — TSH: TSH: 0.976 u[IU]/mL (ref 0.450–4.500)

## 2020-07-15 ENCOUNTER — Other Ambulatory Visit: Payer: Self-pay | Admitting: Family Medicine

## 2020-07-15 DIAGNOSIS — I10 Essential (primary) hypertension: Secondary | ICD-10-CM

## 2020-07-17 ENCOUNTER — Other Ambulatory Visit: Payer: Self-pay | Admitting: Family Medicine

## 2020-07-30 DIAGNOSIS — G4733 Obstructive sleep apnea (adult) (pediatric): Secondary | ICD-10-CM | POA: Diagnosis not present

## 2020-08-03 ENCOUNTER — Ambulatory Visit: Payer: Self-pay | Admitting: Family Medicine

## 2020-08-04 ENCOUNTER — Other Ambulatory Visit: Payer: Self-pay

## 2020-08-04 ENCOUNTER — Ambulatory Visit (INDEPENDENT_AMBULATORY_CARE_PROVIDER_SITE_OTHER): Payer: Medicare HMO

## 2020-08-04 DIAGNOSIS — Z23 Encounter for immunization: Secondary | ICD-10-CM | POA: Diagnosis not present

## 2020-08-15 NOTE — Progress Notes (Signed)
Established patient visit   Patient: Louis Luna   DOB: [birth year]   69 y.o. Adult  MRN: [id number] Visit Date: 08/16/2020  Today's healthcare provider: [name], MD   Chief Complaint  Patient presents with   Back Pain   Subjective    Back Pain This is a recurrent problem. The current episode started 1 to 4 weeks ago. The problem has been gradually worsening since onset. The pain is present in the lumbar spine. The quality of the pain is described as aching, cramping and shooting. The pain is moderate (can be severe). The pain is the same all the time. Pertinent negatives include no abdominal pain, chest pain or fever.    He really thinks he injured it after hitting a large amount of golf balls 1 day. No radicular symptoms`.     Medications: Outpatient Medications Prior to Visit  Medication Sig   amLODipine (NORVASC) 5 MG tablet TAKE 1 TABLET(5 MG) BY MOUTH DAILY   aspirin EC 81 MG tablet Take 81 mg by mouth daily.   atenolol (TENORMIN) 50 MG tablet TAKE 1 TABLET BY MOUTH EVERY DAY   chlorthalidone (HYGROTON) 25 MG tablet TAKE 1 TABLET(25 MG) BY MOUTH EVERY MORNING   diazepam (VALIUM) 5 MG tablet TAKE 1 TABLET BY MOUTH ONCE DAILY IF NEEDED FOR ANXIETY   Docusate Calcium (STOOL SOFTENER PO) Take by mouth.   Doxylamine Succinate, Sleep, (SLEEP AID PO) Take by mouth.   glucosamine-chondroitin 500-400 MG tablet Take 1 tablet by mouth daily.   ibuprofen (ADVIL,MOTRIN) 800 MG tablet Take 1 tablet (800 mg total) by mouth every 8 (eight) hours as needed for moderate pain.   Multiple Vitamin (MULTIVITAMIN WITH MINERALS) TABS tablet Take 1 tablet by mouth daily.   psyllium (METAMUCIL SMOOTH TEXTURE) 28 % packet Take 1 packet by mouth daily.   saw palmetto 80 MG capsule Take 80 mg by mouth 2 (two) times daily.   sertraline (ZOLOFT) 50 MG tablet Take 1 tablet (50 mg total) by mouth daily.   sertraline (ZOLOFT) 50 MG tablet TAKE 1 TABLET BY MOUTH EVERY  DAY   ciprofloxacin (CILOXAN) 0.3 % ophthalmic solution 1 drop, every 4 hours, while awake, for the next 5 days.   No facility-administered medications prior to visit.    Review of Systems  Constitutional: Negative for appetite change, chills and fever.  Respiratory: Negative for chest tightness, shortness of breath and wheezing.   Cardiovascular: Negative for chest pain and palpitations.  Gastrointestinal: Negative for abdominal pain, nausea and vomiting.  Musculoskeletal: Positive for back pain.  Neurological: Negative.   Hematological: Negative.   Psychiatric/Behavioral: Negative.       Objective    BP 133/85    Pulse 82    Temp 98.5 F (36.9 C)    Resp 16    Ht 5\' 11"  (1.803 m)    Wt 235 lb (106.6 kg)    BMI 32.78 kg/m    Physical Exam Vitals reviewed.  HENT:     Head: Normocephalic and atraumatic.     Right Ear: External ear normal.     Left Ear: External ear normal.  Eyes:     General: No scleral icterus. Cardiovascular:     Rate and Rhythm: Normal rate and regular rhythm.     Pulses: Normal pulses.     Heart sounds: Normal heart sounds.  Pulmonary:     Effort: Pulmonary effort is normal.     Breath sounds: Normal breath  sounds.  Abdominal:     Palpations: Abdomen is soft.  Musculoskeletal:     Comments: He has mild pain with twisting motion in his LS-spine.  Skin:    General: Skin is warm and dry.     Comments: Multiple lipomas, especially on forearms.  Neurological:     General: No focal deficit present.     Mental Status: He is alert and oriented to person, place, and time.     Comments: Straight leg raise is negative.  Neurologic exam of lower extremities normal.  Psychiatric:        Mood and Affect: Mood normal.        Behavior: Behavior normal.        Thought Content: Thought content normal.        Judgment: Judgment normal.       No results found for any visits on 08/16/20.  Assessment & Plan     1. Acute low back pain with sciatica,  sciatica laterality unspecified, unspecified back pain laterality I think this is musculoskeletal pain.  Treat with meloxicam for a couple weeks and Flexeril at bedtime.  Consider physical therapy referral.  May need imaging in the future. - cyclobenzaprine (FLEXERIL) 10 MG tablet; Take 1 tablet (10 mg total) by mouth 3 (three) times daily as needed for muscle spasms.  Dispense: 30 tablet; Refill: 0 - meloxicam (MOBIC) 15 MG tablet; Take 1 tablet (15 mg total) by mouth daily.  Dispense: 30 tablet; Refill: 1  2. Primary hypertension Good control.   No follow-ups on file.         Ian Castagna Cranford Mon, MD  [facility] [phone] (phone) [phone] (fax)  [place]

## 2020-08-16 ENCOUNTER — Other Ambulatory Visit: Payer: Self-pay

## 2020-08-16 ENCOUNTER — Ambulatory Visit (INDEPENDENT_AMBULATORY_CARE_PROVIDER_SITE_OTHER): Payer: Medicare HMO | Admitting: Family Medicine

## 2020-08-16 ENCOUNTER — Encounter: Payer: Self-pay | Admitting: Family Medicine

## 2020-08-16 VITALS — BP 133/85 | HR 82 | Temp 98.5°F | Resp 16 | Ht 71.0 in | Wt 225.0 lb

## 2020-08-16 DIAGNOSIS — I10 Essential (primary) hypertension: Secondary | ICD-10-CM | POA: Diagnosis not present

## 2020-08-16 DIAGNOSIS — M5440 Lumbago with sciatica, unspecified side: Secondary | ICD-10-CM

## 2020-08-16 MED ORDER — MELOXICAM 15 MG PO TABS: 15.0000 mg | ORAL_TABLET | Freq: Every day | ORAL | 1 refills | Status: DC

## 2020-08-16 MED ORDER — CYCLOBENZAPRINE HCL 10 MG PO TABS: 10.0000 mg | ORAL_TABLET | Freq: Three times a day (TID) | ORAL | 0 refills | Status: DC | PRN

## 2020-08-30 DIAGNOSIS — G4733 Obstructive sleep apnea (adult) (pediatric): Secondary | ICD-10-CM | POA: Diagnosis not present

## 2020-09-06 ENCOUNTER — Telehealth: Payer: Self-pay | Admitting: Family Medicine

## 2020-09-06 NOTE — Telephone Encounter (Signed)
Copied from [place] [phone]. Topic: Medicare AWV >> Sep 06, 2020  1:49 PM Cher Nakai R wrote: Reason for CRM:  No answer unable to leave message for patient to call back and schedule Medicare Annual Wellness Visit (AWV) in office.  Patient had a call blocker app   If not able to come in office, please offer to do virtually.   Last AWV 03/13/2017  Please schedule at anytime with [facility] Health Advisor.  If any questions, please contact me at [phone]

## 2020-09-26 ENCOUNTER — Other Ambulatory Visit: Payer: Self-pay | Admitting: Family Medicine

## 2020-09-26 DIAGNOSIS — F32A Depression, unspecified: Secondary | ICD-10-CM

## 2020-09-30 DIAGNOSIS — G4733 Obstructive sleep apnea (adult) (pediatric): Secondary | ICD-10-CM | POA: Diagnosis not present

## 2020-10-17 ENCOUNTER — Other Ambulatory Visit: Payer: Self-pay | Admitting: Family Medicine

## 2020-10-17 DIAGNOSIS — M5440 Lumbago with sciatica, unspecified side: Secondary | ICD-10-CM

## 2020-10-18 DIAGNOSIS — G4733 Obstructive sleep apnea (adult) (pediatric): Secondary | ICD-10-CM | POA: Diagnosis not present

## 2020-10-28 DIAGNOSIS — G4733 Obstructive sleep apnea (adult) (pediatric): Secondary | ICD-10-CM | POA: Diagnosis not present

## 2020-11-28 DIAGNOSIS — G4733 Obstructive sleep apnea (adult) (pediatric): Secondary | ICD-10-CM | POA: Diagnosis not present

## 2020-12-19 ENCOUNTER — Encounter: Payer: Self-pay | Admitting: Podiatry

## 2020-12-19 ENCOUNTER — Ambulatory Visit: Payer: Medicare HMO | Admitting: Podiatry

## 2020-12-19 ENCOUNTER — Other Ambulatory Visit: Payer: Self-pay

## 2020-12-19 ENCOUNTER — Ambulatory Visit (INDEPENDENT_AMBULATORY_CARE_PROVIDER_SITE_OTHER): Payer: Medicare HMO

## 2020-12-19 DIAGNOSIS — M778 Other enthesopathies, not elsewhere classified: Secondary | ICD-10-CM | POA: Diagnosis not present

## 2020-12-19 DIAGNOSIS — L603 Nail dystrophy: Secondary | ICD-10-CM | POA: Diagnosis not present

## 2020-12-19 DIAGNOSIS — B351 Tinea unguium: Secondary | ICD-10-CM | POA: Diagnosis not present

## 2020-12-19 NOTE — Progress Notes (Signed)
Subjective:  Patient ID: Louis Luna, adult    DOB: 07-20-1951,  MRN: [id number] HPI Chief Complaint  Patient presents with  . Foot Pain    1st MPJ left - aching x 3 weeks, no injury, did get new work shoes with steel shanks and thinks may have aggravated it, 2 episodes where the area got really red and swollen, better today  . New Patient (Initial Visit)    70 y.o. adult presents with the above complaint.   Denies fever chills nausea vomiting muscle aches pains calf pain back pain chest pain shortness of breath.  Past Medical History:  Diagnosis Date  . Coronary artery disease   . Depression   . GERD (gastroesophageal reflux disease)   . H/O elevated lipids   . Hypertension   . Personal history of tobacco use, presenting hazards to health 01/26/2016  . Sleep apnea    Past Surgical History:  Procedure Laterality Date  . ANKLE SURGERY    . APPENDECTOMY    . COLONOSCOPY N/A 03/23/2016   Procedure: COLONOSCOPY;  Surgeon: Manya Silvas, MD;  Location: [facility] ENDOSCOPY;  Service: Endoscopy;  Laterality: N/A;  . HERNIA REPAIR     inguinal-left 04/2009  . QUADRICEPS TENDON REPAIR Left 05/05/2015   Procedure: REPAIR QUADRICEP TENDON;  Surgeon: Hessie Knows, MD;  Location: [facility] ORS;  Service: Orthopedics;  Laterality: Left;  . TUMOR EXCISION     from Rt side of the neck-benign    Current Outpatient Medications:  .  amLODipine (NORVASC) 5 MG tablet, TAKE 1 TABLET(5 MG) BY MOUTH DAILY, Disp: 90 tablet, Rfl: 0 .  aspirin EC 81 MG tablet, Take 81 mg by mouth daily., Disp: , Rfl:  .  atenolol (TENORMIN) 50 MG tablet, TAKE 1 TABLET BY MOUTH EVERY DAY, Disp: 90 tablet, Rfl: 0 .  chlorthalidone (HYGROTON) 25 MG tablet, TAKE 1 TABLET(25 MG) BY MOUTH EVERY MORNING, Disp: 90 tablet, Rfl: 3 .  ciprofloxacin (CILOXAN) 0.3 % ophthalmic solution, 1 drop, every 4 hours, while awake, for the next 5 days., Disp: 5 mL, Rfl: 0 .  cyclobenzaprine (FLEXERIL) 10 MG tablet, Take 1 tablet (10 mg total)  by mouth 3 (three) times daily as needed for muscle spasms., Disp: 30 tablet, Rfl: 0 .  diazepam (VALIUM) 5 MG tablet, TAKE 1 TABLET BY MOUTH ONCE DAILY IF NEEDED FOR ANXIETY, Disp: 30 tablet, Rfl: 4 .  Docusate Calcium (STOOL SOFTENER PO), Take by mouth., Disp: , Rfl:  .  Doxylamine Succinate, Sleep, (SLEEP AID PO), Take by mouth., Disp: , Rfl:  .  glucosamine-chondroitin 500-400 MG tablet, Take 1 tablet by mouth daily., Disp: , Rfl:  .  ibuprofen (ADVIL,MOTRIN) 800 MG tablet, Take 1 tablet (800 mg total) by mouth every 8 (eight) hours as needed for moderate pain., Disp: 15 tablet, Rfl: 0 .  meloxicam (MOBIC) 15 MG tablet, TAKE 1 TABLET(15 MG) BY MOUTH DAILY, Disp: 30 tablet, Rfl: 1 .  Multiple Vitamin (MULTIVITAMIN WITH MINERALS) TABS tablet, Take 1 tablet by mouth daily., Disp: , Rfl:  .  psyllium (METAMUCIL SMOOTH TEXTURE) 28 % packet, Take 1 packet by mouth daily., Disp: , Rfl:  .  saw palmetto 80 MG capsule, Take 80 mg by mouth 2 (two) times daily., Disp: , Rfl:  .  sertraline (ZOLOFT) 50 MG tablet, Take 1 tablet (50 mg total) by mouth daily., Disp: 90 tablet, Rfl: 3 .  sertraline (ZOLOFT) 50 MG tablet, TAKE 1 TABLET BY MOUTH EVERY DAY, Disp: 90 tablet, Rfl:  0  No Known Allergies Review of Systems Objective:  There were no vitals filed for this visit.  General: Well developed, nourished, in no acute distress, alert and oriented x3   Dermatological: Skin is warm, dry and supple bilateral. Nails x 10 are well maintained; some of the nails do demonstrate some possible onychomycosis.  Remaining integument appears unremarkable at this time. There are no open sores, no preulcerative lesions, no rash or signs of infection present.  Vascular: Dorsalis Pedis artery and Posterior Tibial artery pedal pulses are 2/4 bilateral with immedate capillary fill time. Pedal hair growth present. No varicosities and no lower extremity edema present bilateral.   Neruologic: Grossly intact via light touch  bilateral. Vibratory intact via tuning fork bilateral. Protective threshold with Semmes Wienstein monofilament intact to all pedal sites bilateral. Patellar and Achilles deep tendon reflexes 2+ bilateral. No Babinski or clonus noted bilateral.   Musculoskeletal: No gross boney pedal deformities bilateral. No pain, crepitus, or limitation noted with foot and ankle range of motion bilateral. Muscular strength 5/5 in all groups tested bilateral.  No reproducible pain on palpation of the first metatarsal phalangeal joint left.  Gait: Unassisted, Nonantalgic.    Radiographs:  Radiographs taken today demonstrate very mild hallux valgus deformities with bipartite sesamoids of the first metatarsophalangeal joint.  No other significant osseous abnormalities are noted.  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  Nail dystrophy cannot rule out onychomycosis.  Plan: Discussed etiology pathology and surgical therapies took samples of skin nail today to be sent for pathologic evaluation.  Also follow-up with him in 1 month for findings and discussed once again the metatarsophalangeal joint.     Kelvyn Schunk T. [place], [place]

## 2020-12-20 ENCOUNTER — Other Ambulatory Visit: Payer: Self-pay | Admitting: Family Medicine

## 2020-12-20 DIAGNOSIS — I10 Essential (primary) hypertension: Secondary | ICD-10-CM

## 2020-12-20 DIAGNOSIS — M5440 Lumbago with sciatica, unspecified side: Secondary | ICD-10-CM

## 2020-12-20 NOTE — Telephone Encounter (Signed)
Requested Prescriptions  Pending Prescriptions Disp Refills  . meloxicam (MOBIC) 15 MG tablet [Pharmacy Med Name: MELOXICAM 15MG  TABLETS] 30 tablet 1    Sig: TAKE 1 TABLET(15 MG) BY MOUTH DAILY     Analgesics:  COX2 Inhibitors Passed - 12/20/2020  3:28 AM      Passed - HGB in normal range and within 360 days    Hemoglobin  Date Value Ref Range Status  07/04/2020 15.7 13.0 - 17.7 g/dL Final         Passed - Cr in normal range and within 360 days    Creatinine, Ser  Date Value Ref Range Status  07/04/2020 0.95 0.76 - 1.27 mg/dL Final         Passed - Patient is not pregnant      Passed - Valid encounter within last 12 months    Recent Outpatient Visits          4 months ago Acute low back pain with sciatica, sciatica laterality unspecified, unspecified back pain laterality   [facility] Jerrol Banana., MD   5 months ago Essential hypertension   [facility] Jerrol Banana., MD   9 months ago Essential hypertension   [facility] Jerrol Banana., MD   11 months ago Essential hypertension   [facility] Jerrol Banana., MD   1 year ago Obstructive sleep apnea   [facility] Jerrol Banana., MD

## 2020-12-20 NOTE — Telephone Encounter (Signed)
Requested Prescriptions  Pending Prescriptions Disp Refills  . amLODipine (NORVASC) 5 MG tablet [Pharmacy Med Name: AMLODIPINE BESYLATE 5MG  TABLETS] 90 tablet 0    Sig: TAKE 1 TABLET(5 MG) BY MOUTH DAILY     Cardiovascular:  Calcium Channel Blockers Passed - 12/20/2020 10:54 AM      Passed - Last BP in normal range    BP Readings from Last 1 Encounters:  08/16/20 133/85         Passed - Valid encounter within last 6 months    Recent Outpatient Visits          4 months ago Acute low back pain with sciatica, sciatica laterality unspecified, unspecified back pain laterality   [facility] Jerrol Banana., MD   5 months ago Essential hypertension   [facility] Jerrol Banana., MD   9 months ago Essential hypertension   [facility] Jerrol Banana., MD   11 months ago Essential hypertension   [facility] Jerrol Banana., MD   1 year ago Obstructive sleep apnea   [facility] Jerrol Banana., MD

## 2020-12-24 ENCOUNTER — Other Ambulatory Visit: Payer: Self-pay | Admitting: Family Medicine

## 2020-12-28 DIAGNOSIS — G4733 Obstructive sleep apnea (adult) (pediatric): Secondary | ICD-10-CM | POA: Diagnosis not present

## 2021-01-13 ENCOUNTER — Other Ambulatory Visit: Payer: Self-pay | Admitting: Family Medicine

## 2021-01-13 DIAGNOSIS — F32A Depression, unspecified: Secondary | ICD-10-CM

## 2021-01-16 ENCOUNTER — Encounter: Payer: Self-pay | Admitting: Podiatry

## 2021-01-16 ENCOUNTER — Other Ambulatory Visit: Payer: Self-pay

## 2021-01-16 ENCOUNTER — Ambulatory Visit: Payer: Medicare HMO | Admitting: Podiatry

## 2021-01-16 DIAGNOSIS — L603 Nail dystrophy: Secondary | ICD-10-CM

## 2021-01-16 MED ORDER — TERBINAFINE HCL 250 MG PO TABS: 250.0000 mg | ORAL_TABLET | Freq: Every day | ORAL | 0 refills | Status: DC

## 2021-01-16 NOTE — Patient Instructions (Signed)
Terbinafine tablets What is this medicine? TERBINAFINE (TER bin a feen) is an antifungal medicine. It is used to treat certain kinds of fungal or yeast infections. This medicine may be used for other purposes; ask your health care provider or pharmacist if you have questions. COMMON BRAND NAME(S): Lamisil, Terbinex What should I tell my health care provider before I take this medicine? They need to know if you have any of these conditions:  drink alcoholic beverages  kidney disease  liver disease  an unusual or allergic reaction to terbinafine, other medicines, foods, dyes, or preservatives  pregnant or trying to get pregnant  breast-feeding How should I use this medicine? Take this medicine by mouth with a full glass of water. Follow the directions on the prescription label. You can take this medicine with food or on an empty stomach. Take your medicine at regular intervals. Do not take your medicine more often than directed. Do not skip doses or stop your medicine early even if you feel better. Do not stop taking except on your doctor's advice. A special MedGuide will be given to you by the pharmacist with each prescription and refill. Be sure to read this information carefully each time. Talk to your pediatrician regarding the use of this medicine in children. Special care may be needed. Overdosage: If you think you have taken too much of this medicine contact a poison control center or emergency room at once. NOTE: This medicine is only for you. Do not share this medicine with others. What if I miss a dose? If you miss a dose, take it as soon as you can. If it is almost time for your next dose, take only that dose. Do not take double or extra doses. What may interact with this medicine? Do not take this medicine with any of the following medications:  thioridazine This medicine may also interact with the following  medications:  beta-blockers  caffeine  cimetidine  cyclosporine  medicines for depression, anxiety, or psychotic disturbances  medicines for fungal infections like fluconazole and ketoconazole  medicines for irregular heartbeat like amiodarone, flecainide and propafenone  rifampin  warfarin This list may not describe all possible interactions. Give your health care provider a list of all the medicines, herbs, non-prescription drugs, or dietary supplements you use. Also tell them if you smoke, drink alcohol, or use illegal drugs. Some items may interact with your medicine. What should I watch for while using this medicine? Visit your doctor or health care provider regularly. Tell your doctor right away if you have nausea or vomiting, loss of appetite, stomach pain on your right upper side, yellow skin, dark urine, light stools, or are over tired. Some fungal infections need many weeks or months of treatment to cure. If you are taking this medicine for a long time, you will need to have important blood work done. This medicine may cause serious skin reactions. They can happen weeks to months after starting the medicine. Contact your health care provider right away if you notice fevers or flu-like symptoms with a rash. The rash may be red or purple and then turn into blisters or peeling of the skin. Or, you might notice a red rash with swelling of the face, lips or lymph nodes in your neck or under your arms. What side effects may I notice from receiving this medicine? Side effects that you should report to your doctor or health care professional as soon as possible:  allergic reactions like skin rash or hives,   swelling of the face, lips, or tongue  changes in vision  dark urine  fever or infection  general ill feeling or flu-like symptoms  light-colored stools  loss of appetite, nausea  rash, fever, and swollen lymph nodes  redness, blistering, peeling or loosening of the  skin, including inside the mouth  right upper belly pain  unusually weak or tired  yellowing of the eyes or skin Side effects that usually do not require medical attention (report to your doctor or health care professional if they continue or are bothersome):  changes in taste  diarrhea  hair loss  muscle or joint pain  stomach gas  stomach upset This list may not describe all possible side effects. Call your doctor for medical advice about side effects. You may report side effects to FDA at 1-800-FDA-1088. Where should I keep my medicine? Keep out of the reach of children. Store at room temperature below 25 degrees C (77 degrees F). Protect from light. Throw away any unused medicine after the expiration date. NOTE: This sheet is a summary. It may not cover all possible information. If you have questions about this medicine, talk to your doctor, pharmacist, or health care provider.  2021 Elsevier/Gold Standard (2018-11-21 15:37:07)  

## 2021-01-16 NOTE — Progress Notes (Signed)
He presents today for follow-up of his pathology report.  He states that nothing has changed.  Objective: Vital signs are stable he is alert and oriented x3.  Pathology result does demonstrate onychomycosis with Trichophyton rubrum.  Assessment: Onychomycosis.  Plan: We discussed the pros and cons of topical therapy, laser therapy and oral therapy.  At this point he would like to try the oral therapy because of its efficacy.  We did discuss the pros and cons of the use of the medication and the possible side effects associated with that he understands and is amenable to it he has recently had a complete metabolic panel performed with normal values.  At this point unguinal go ahead and write a prescription for 30 tablets of Lamisil.  Each tablet 250 mg once a day for 30 days and I will follow-up with him in 1 month at which time blood work will be performed and another prescription written.  He will call with questions or concerns.

## 2021-01-17 DIAGNOSIS — G4733 Obstructive sleep apnea (adult) (pediatric): Secondary | ICD-10-CM | POA: Diagnosis not present

## 2021-01-19 ENCOUNTER — Telehealth: Payer: Self-pay

## 2021-01-19 NOTE — Telephone Encounter (Signed)
Copied from [place] [id number]. Topic: Medical Record Request - Other >> Jan 19, 2021 11:31 AM Alanda Slim E wrote: Patient Name/DOB/MRN #: Louis Luna DOB 1951-02-10 / mrn [id number] Requestor Name/Agency: [employer] / Louis Luna  Call Back #: [id number] Information Requested: records from 2012 -2022  They also  need his wife records from 2012-2022 as well Louis Luna DOB: 12.25.1951/ they faxed over request and wanted to know if they were received / please advise

## 2021-01-28 DIAGNOSIS — G4733 Obstructive sleep apnea (adult) (pediatric): Secondary | ICD-10-CM | POA: Diagnosis not present

## 2021-02-02 NOTE — Telephone Encounter (Signed)
Received signed release. Will forward ROI to [place].

## 2021-03-01 ENCOUNTER — Encounter: Payer: Medicare HMO | Admitting: Podiatry

## 2021-03-05 ENCOUNTER — Encounter: Payer: Self-pay | Admitting: *Deleted

## 2021-03-05 ENCOUNTER — Other Ambulatory Visit: Payer: Self-pay | Admitting: Family Medicine

## 2021-03-05 DIAGNOSIS — I10 Essential (primary) hypertension: Secondary | ICD-10-CM

## 2021-03-05 NOTE — Telephone Encounter (Signed)
Courtesy refill per protocol. Send My Chart message for patient to call clinic to schedule future appointment for further refills.

## 2021-03-13 ENCOUNTER — Other Ambulatory Visit: Payer: Self-pay | Admitting: Family Medicine

## 2021-03-13 DIAGNOSIS — F32A Depression, unspecified: Secondary | ICD-10-CM

## 2021-03-13 NOTE — Telephone Encounter (Signed)
  Notes to clinic:  patient due for 6 month follow up  Several attempts made to contact patient  Review for refill    Requested Prescriptions  Pending Prescriptions Disp Refills   atenolol (TENORMIN) 50 MG tablet [Pharmacy Med Name: ATENOLOL 50MG  TABLETS] 90 tablet 0    Sig: TAKE 1 TABLET BY MOUTH EVERY DAY      Cardiovascular:  Beta Blockers Failed - 03/13/2021  6:37 AM      Failed - Valid encounter within last 6 months    Recent Outpatient Visits           6 months ago Acute low back pain with sciatica, sciatica laterality unspecified, unspecified back pain laterality   [facility] Jerrol Banana., MD   8 months ago Essential hypertension   [facility] Jerrol Banana., MD   11 months ago Essential hypertension   [facility] Jerrol Banana., MD   1 year ago Essential hypertension   [facility] Jerrol Banana., MD   1 year ago Obstructive sleep apnea   [facility] Jerrol Banana., MD                Passed - Last BP in normal range    BP Readings from Last 1 Encounters:  08/16/20 133/85          Passed - Last Heart Rate in normal range    Pulse Readings from Last 1 Encounters:  08/16/20 82            sertraline (ZOLOFT) 50 MG tablet [Pharmacy Med Name: SERTRALINE 50MG  TABLETS] 90 tablet 0    Sig: TAKE 1 TABLET BY MOUTH EVERY DAY      Psychiatry:  Antidepressants - SSRI Failed - 03/13/2021  6:37 AM      Failed - Valid encounter within last 6 months    Recent Outpatient Visits           6 months ago Acute low back pain with sciatica, sciatica laterality unspecified, unspecified back pain laterality   [facility] Jerrol Banana., MD   8 months ago Essential hypertension   [facility] Jerrol Banana., MD   11 months ago Essential hypertension   [facility] Jerrol Banana., MD   1 year ago  Essential hypertension   [facility] Jerrol Banana., MD   1 year ago Obstructive sleep apnea   [facility] Jerrol Banana., MD                Passed - Completed PHQ-2 or PHQ-9 in the last 360 days

## 2021-03-31 ENCOUNTER — Ambulatory Visit: Payer: Self-pay

## 2021-03-31 ENCOUNTER — Other Ambulatory Visit: Payer: Self-pay | Admitting: Family Medicine

## 2021-03-31 DIAGNOSIS — M5440 Lumbago with sciatica, unspecified side: Secondary | ICD-10-CM

## 2021-03-31 MED ORDER — CYCLOBENZAPRINE HCL 10 MG PO TABS: 10.0000 mg | ORAL_TABLET | Freq: Three times a day (TID) | ORAL | 0 refills | Status: DC | PRN

## 2021-03-31 MED ORDER — MELOXICAM 15 MG PO TABS: ORAL_TABLET | ORAL | 1 refills | Status: DC

## 2021-03-31 NOTE — Telephone Encounter (Signed)
Reason for Disposition  [1] MODERATE back pain (e.g., interferes with normal activities) AND [2] present > 3 days  Answer Assessment - Initial Assessment Questions 1. ONSET: "When did the pain begin?"      2 days ago 2. LOCATION: "Where does it hurt?" (upper, mid or lower back)     Low back 3. SEVERITY: "How bad is the pain?"  (e.g., Scale 1-10; mild, moderate, or severe)   - MILD (1-3): doesn't interfere with normal activities    - MODERATE (4-7): interferes with normal activities or awakens from sleep    - SEVERE (8-10): excruciating pain, unable to do any normal activities      Now - 5-6 4. PATTERN: "Is the pain constant?" (e.g., yes, no; constant, intermittent)      Comes and goes 5. RADIATION: "Does the pain shoot into your legs or elsewhere?"     No 6. CAUSE:  "What do you think is causing the back pain?"      Unsure 7. BACK OVERUSE:  "Any recent lifting of heavy objects, strenuous work or exercise?"     Yes 8. MEDICATIONS: "What have you taken so far for the pain?" (e.g., nothing, acetaminophen, NSAIDS)     Motrin 9. NEUROLOGIC SYMPTOMS: "Do you have any weakness, numbness, or problems with bowel/bladder control?"     No 10. OTHER SYMPTOMS: "Do you have any other symptoms?" (e.g., fever, abdominal pain, burning with urination, blood in urine)       No 11. PREGNANCY: "Is there any chance you are pregnant?" (e.g., yes, no; LMP)       N/a  Protocols used: Back Pain-A-AH

## 2021-03-31 NOTE — Telephone Encounter (Signed)
done

## 2021-03-31 NOTE — Telephone Encounter (Signed)
Pt. Has pulled his low back at work. Having muscle spasms x 2 days. Has taken Motrin and tried heat. No availability in the practice today. Asking if meloxicam and flexeril can be refilled. These were prescribed in December for back pain. Please advise.

## 2021-03-31 NOTE — Telephone Encounter (Signed)
Ok to refill meloxicam and flexeril? Please advise. Thanks!

## 2021-04-15 ENCOUNTER — Other Ambulatory Visit: Payer: Self-pay | Admitting: Family Medicine

## 2021-04-15 DIAGNOSIS — I10 Essential (primary) hypertension: Secondary | ICD-10-CM

## 2021-04-15 NOTE — Telephone Encounter (Signed)
Called pt and VM full Requested Prescriptions  Pending Prescriptions Disp Refills  . amLODipine (NORVASC) 5 MG tablet [Pharmacy Med Name: AMLODIPINE BESYLATE '5MG'$  TABLETS] 30 tablet 0    Sig: TAKE 1 TABLET(5 MG) BY MOUTH DAILY     Cardiovascular:  Calcium Channel Blockers Failed - 04/15/2021  9:25 AM      Failed - Valid encounter within last 6 months    Recent Outpatient Visits          8 months ago Acute low back pain with sciatica, sciatica laterality unspecified, unspecified back pain laterality   [facility] Jerrol Banana., MD   9 months ago Essential hypertension   [facility] Jerrol Banana., MD   1 year ago Essential hypertension   [facility] Jerrol Banana., MD   1 year ago Essential hypertension   [facility] Jerrol Banana., MD   1 year ago Obstructive sleep apnea   [facility] Jerrol Banana., MD             Passed - Last BP in normal range    BP Readings from Last 1 Encounters:  08/16/20 133/85

## 2021-04-18 DIAGNOSIS — G4733 Obstructive sleep apnea (adult) (pediatric): Secondary | ICD-10-CM | POA: Diagnosis not present

## 2021-04-25 ENCOUNTER — Other Ambulatory Visit: Payer: Self-pay | Admitting: Family Medicine

## 2021-04-25 DIAGNOSIS — M5440 Lumbago with sciatica, unspecified side: Secondary | ICD-10-CM

## 2021-04-25 NOTE — Telephone Encounter (Signed)
last RF 03/31/21 #30 1 RF

## 2021-05-05 ENCOUNTER — Ambulatory Visit: Payer: Self-pay | Admitting: *Deleted

## 2021-05-05 NOTE — Telephone Encounter (Signed)
Patient's wife called to report patient c/o groin pain while mowing. Patient has hx hernia. Patient reports he feels a "sting" at times and pain requires him to "stop moving" and pain goes away. Reports he may have lifted something too heavy at his work at [employer]. Reports pain is not significant to go to ED but wanted PCP aware due to possible hernia. Denies swelling , vomiting, pain in scrotum or penis. Patient would like to know if PCP would like see him . Patient would like to hear from PCP prior to scheduling appt. Reviewed with patient if he has severe pain in groin , abdomen, legs , scrotum or penis, swelling go to UC or ED for evaluation. Patient verbalized understanding of care advise and to call back or go to [facility] or ED if symptoms worsen.

## 2021-05-05 NOTE — Telephone Encounter (Signed)
FYI. Thanks.

## 2021-05-05 NOTE — Telephone Encounter (Signed)
Answer Assessment - Initial Assessment Questions 1. MECHANISM: "How did the injury happen?" (e.g., twisting injury, direct blow)      Pain in groin today while mowing ,stings  2. ONSET: "When did the injury happen?" (Minutes or hours ago)      Na  3. LOCATION: "Where is the injury located?"      Groin area 4. APPEARANCE of INJURY: "What does the injury look like?"  (e.g., looks normal; bruise, swelling)     Denies swelling or bruising  5. PAIN: "Is there pain?" If Yes, ask: "How bad is the pain?"   "What does it keep you from doing?" (e.g., Scale 1-10; or mild, moderate, severe)   -  NONE: (0): no pain   -  MILD (1-3): doesn't interfere with normal activities    -  MODERATE (4-7): interferes with normal activities (e.g., work or school) or awakens from sleep, limping    -  SEVERE (8-10): excruciating pain, unable to do any normal activities, unable to walk     Mild now but severe stinging at times and has to stop moving  6. SIZE: For cuts, bruises, or swelling, ask: "How large is it?" (e.g., inches or centimeters;  entire joint)      na 7. TETANUS: For any breaks in the skin, ask: "When was the last tetanus booster?"     na 8. OTHER SYMPTOMS: "Do you have any other symptoms?"      Stings in groin 9. PREGNANCY: "Is there any chance you are pregnant?" "When was your last menstrual period?"     na  Protocols used: Groin Injury and Strain-A-AH

## 2021-05-08 ENCOUNTER — Ambulatory Visit (INDEPENDENT_AMBULATORY_CARE_PROVIDER_SITE_OTHER): Payer: Medicare HMO | Admitting: Family Medicine

## 2021-05-08 ENCOUNTER — Other Ambulatory Visit: Payer: Self-pay

## 2021-05-08 ENCOUNTER — Encounter: Payer: Self-pay | Admitting: Family Medicine

## 2021-05-08 VITALS — BP 115/80 | HR 89 | Resp 16 | Wt 209.0 lb

## 2021-05-08 DIAGNOSIS — K4090 Unilateral inguinal hernia, without obstruction or gangrene, not specified as recurrent: Secondary | ICD-10-CM

## 2021-05-08 DIAGNOSIS — R1031 Right lower quadrant pain: Secondary | ICD-10-CM

## 2021-05-08 NOTE — Progress Notes (Signed)
I,[name],acting as a scribe for [name], MD.,have documented all relevant documentation on the behalf of [name], MD,as directed by  [name], MD while in the presence of [name], MD.   Established patient visit   Patient: Louis Luna   DOB: 09/10/1950   70 y.o. Male  MRN: [id number] Visit Date: 05/08/2021  Today's healthcare provider: [name], MD   Chief Complaint  Patient presents with   Hernia   Subjective    Groin Pain The patient's primary symptoms include pelvic pain. This is a new problem. The current episode started more than 1 month ago (3 months). The problem has been waxing and waning. The pain is severe. Pertinent negatives include no abdominal pain, anorexia, chest pain, chills, constipation, coughing, diarrhea, discolored urine, dysuria, fever, flank pain, frequency, headaches, hematuria, hesitancy, joint pain, joint swelling, nausea, painful intercourse, rash, shortness of breath, sore throat, urgency, urinary retention or vomiting. The symptoms are aggravated by activity and heavy lifting.     Patient has had groin pain for 3 months. Patient believes he has a hernia on right side groin.  The pain is not weightbearing.  He has to do a lot of heavy lifting at work.  This seems to aggravate it.  No change in bowel habits.    Medications: Outpatient Medications Prior to Visit  Medication Sig   amLODipine (NORVASC) 5 MG tablet TAKE 1 TABLET(5 MG) BY MOUTH DAILY   aspirin EC 81 MG tablet Take 81 mg by mouth daily.   atenolol (TENORMIN) 50 MG tablet TAKE 1 TABLET BY MOUTH EVERY DAY   chlorthalidone (HYGROTON) 25 MG tablet TAKE 1 TABLET(25 MG) BY MOUTH EVERY MORNING   ciprofloxacin (CILOXAN) 0.3 % ophthalmic solution 1 drop, every 4 hours, while awake, for the next 5 days.   cyclobenzaprine (FLEXERIL) 10 MG tablet Take 1 tablet (10 mg total) by mouth 3 (three) times daily as needed for muscle spasms.    diazepam (VALIUM) 5 MG tablet TAKE 1 TABLET BY MOUTH ONCE DAILY IF NEEDED FOR ANXIETY   Docusate Calcium (STOOL SOFTENER PO) Take by mouth.   Doxylamine Succinate, Sleep, (SLEEP AID PO) Take by mouth.   glucosamine-chondroitin 500-400 MG tablet Take 1 tablet by mouth daily.   ibuprofen (ADVIL,MOTRIN) 800 MG tablet Take 1 tablet (800 mg total) by mouth every 8 (eight) hours as needed for moderate pain.   meloxicam (MOBIC) 15 MG tablet TAKE 1 TABLET(15 MG) BY MOUTH DAILY   Multiple Vitamin (MULTIVITAMIN WITH MINERALS) TABS tablet Take 1 tablet by mouth daily.   psyllium (METAMUCIL SMOOTH TEXTURE) 28 % packet Take 1 packet by mouth daily.   saw palmetto 80 MG capsule Take 80 mg by mouth 2 (two) times daily.   sertraline (ZOLOFT) 50 MG tablet TAKE 1 TABLET BY MOUTH EVERY DAY   terbinafine (LAMISIL) 250 MG tablet Take 1 tablet (250 mg total) by mouth daily.   No facility-administered medications prior to visit.    Review of Systems  Constitutional:  Negative for chills and fever.  HENT:  Negative for sore throat.   Respiratory:  Negative for cough and shortness of breath.   Cardiovascular:  Negative for chest pain.  Gastrointestinal:  Negative for abdominal pain, anorexia, constipation, diarrhea, nausea and vomiting.  Genitourinary:  Positive for pelvic pain. Negative for dysuria, flank pain, frequency, hesitancy and urgency.  Musculoskeletal:  Negative for joint pain.  Skin:  Negative for rash.  Neurological:  Negative for headaches.       Objective    BP 115/80 (BP Location: Right Arm, Patient Position: Sitting, Cuff Size: Large)   Pulse 89   Resp 16   Wt 209 lb (94.8 kg)   SpO2 97%   BMI 29.15 kg/m  BP Readings from Last 3 Encounters:  05/08/21 115/80  08/16/20 133/85  06/27/20 (!) 153/95   Wt Readings from Last 3 Encounters:  05/08/21 209 lb (94.8 kg)  08/16/20 225 lb (102.1 kg)  06/27/20 235 lb (106.6 kg)      Physical Exam Vitals reviewed.  HENT:     Head:  Normocephalic and atraumatic.     Right Ear: External ear normal.     Left Ear: External ear normal.  Eyes:     General: No scleral icterus. Cardiovascular:     Rate and Rhythm: Normal rate and regular rhythm.     Pulses: Normal pulses.     Heart sounds: Normal heart sounds.  Pulmonary:     Effort: Pulmonary effort is normal.     Breath sounds: Normal breath sounds.  Abdominal:     Palpations: Abdomen is soft.  Genitourinary:    Penis: Normal.      Testes: Normal.     Comments: Apparent right inguinal hernia that is minimally tender. Skin:    General: Skin is warm and dry.     Comments: Multiple lipomas, especially on forearms.  Neurological:     General: No focal deficit present.     Mental Status: He is alert and oriented to person, place, and time.  Psychiatric:        Mood and Affect: Mood normal.        Behavior: Behavior normal.        Thought Content: Thought content normal.        Judgment: Judgment normal.      No results found for any visits on 05/08/21.  Assessment & Plan     1. Right inguinal hernia At this point time I think the patient's pain is from an inguinal hernia.  [employer] work for the rest of this week until he talks to surgery. - Ambulatory referral to General Surgery  2. Right inguinal pain I do not think this is hip pain at this time point.   No follow-ups on file.      I, [name], MD, have reviewed all documentation for this visit. The documentation on 05/10/21 for the exam, diagnosis, procedures, and orders are all accurate and complete.    Eithan Beagle Cranford Mon, MD  [facility] [phone] (phone) [phone] (fax)  [place]

## 2021-05-08 NOTE — Patient Instructions (Signed)
Stay Out of work this week.

## 2021-05-11 ENCOUNTER — Encounter: Payer: Self-pay | Admitting: Surgery

## 2021-05-11 ENCOUNTER — Ambulatory Visit (INDEPENDENT_AMBULATORY_CARE_PROVIDER_SITE_OTHER): Payer: Medicare HMO | Admitting: Surgery

## 2021-05-11 ENCOUNTER — Ambulatory Visit: Payer: Self-pay | Admitting: Surgery

## 2021-05-11 ENCOUNTER — Other Ambulatory Visit: Payer: Self-pay

## 2021-05-11 VITALS — BP 135/84 | HR 96 | Temp 98.0°F | Ht 69.0 in | Wt 209.0 lb

## 2021-05-11 DIAGNOSIS — K4090 Unilateral inguinal hernia, without obstruction or gangrene, not specified as recurrent: Secondary | ICD-10-CM | POA: Diagnosis not present

## 2021-05-11 NOTE — Progress Notes (Signed)
Patient ID: Louis Luna, male   DOB: 1951/07/28, 70 y.o.   MRN: [id number]  Chief Complaint: Right inguinal hernia  History of Present Illness Louis Luna is a 70 y.o. male with severe excruciating pain beginning in the right groin back in March April of this year, initial pain occurred on the job.  Spontaneously relieved.  Some lingering swelling in the right groin with recurring bulge at times.  Seemingly exacerbated with constipation, increased his Metamucil to twice daily with improvement/diminishment in pain.  Much minimal exacerbation with cough.  Past Medical History Past Medical History:  Diagnosis Date   Coronary artery disease    Depression    GERD (gastroesophageal reflux disease)    H/O elevated lipids    Hypertension    Personal history of tobacco use, presenting hazards to health 01/26/2016   Sleep apnea       Past Surgical History:  Procedure Laterality Date   ANKLE SURGERY     APPENDECTOMY     COLONOSCOPY N/A 03/23/2016   Procedure: COLONOSCOPY;  Surgeon: Manya Silvas, MD;  Location: [facility];  Service: Endoscopy;  Laterality: N/A;   HERNIA REPAIR     inguinal-left 04/1979   QUADRICEPS TENDON REPAIR Left 05/05/2015   Procedure: REPAIR QUADRICEP TENDON;  Surgeon: Hessie Knows, MD;  Location: [facility];  Service: Orthopedics;  Laterality: Left;   TUMOR EXCISION     from Rt side of the neck-benign    No Known Allergies  Current Outpatient Medications  Medication Sig Dispense Refill   amLODipine (NORVASC) 5 MG tablet TAKE 1 TABLET(5 MG) BY MOUTH DAILY 30 tablet 0   aspirin EC 81 MG tablet Take 81 mg by mouth daily.     atenolol (TENORMIN) 50 MG tablet TAKE 1 TABLET BY MOUTH EVERY DAY 90 tablet 0   chlorthalidone (HYGROTON) 25 MG tablet TAKE 1 TABLET(25 MG) BY MOUTH EVERY MORNING 90 tablet 3   cyclobenzaprine (FLEXERIL) 10 MG tablet Take 1 tablet (10 mg total) by mouth 3 (three) times daily as needed for muscle spasms. 30 tablet 0   diazepam  (VALIUM) 5 MG tablet TAKE 1 TABLET BY MOUTH ONCE DAILY IF NEEDED FOR ANXIETY 30 tablet 4   Docusate Calcium (STOOL SOFTENER PO) Take by mouth.     Doxylamine Succinate, Sleep, (SLEEP AID PO) Take by mouth.     ibuprofen (ADVIL,MOTRIN) 800 MG tablet Take 1 tablet (800 mg total) by mouth every 8 (eight) hours as needed for moderate pain. 15 tablet 0   Multiple Vitamin (MULTIVITAMIN WITH MINERALS) TABS tablet Take 1 tablet by mouth daily.     psyllium (METAMUCIL SMOOTH TEXTURE) 28 % packet Take 1 packet by mouth daily.     sertraline (ZOLOFT) 50 MG tablet TAKE 1 TABLET BY MOUTH EVERY DAY 90 tablet 0   terbinafine (LAMISIL) 250 MG tablet Take 1 tablet (250 mg total) by mouth daily. 30 tablet 0   No current facility-administered medications for this visit.    Family History Family History  Problem Relation Age of Onset   Diabetes Mother    Hypertension Mother    Breast cancer Mother    Alcohol abuse Father    Hypertension Father    Alzheimer's disease Father    Alcohol abuse Brother    Hypertension Brother    Breast cancer Sister    Melanoma Sister    Depression Sister    Breast cancer Sister    Depression Sister    Hypertension Brother  Social History Social History   Tobacco Use   Smoking status: Former    Packs/day: 1.00    Years: 30.00    Pack years: 30.00    Types: Cigarettes    Quit date: 10/26/2011    Years since quitting: 9.5   Smokeless tobacco: Never  Vaping Use   Vaping Use: Never used  Substance Use Topics   Alcohol use: Yes    Comment: ocassionally-beer   Drug use: No        Review of Systems  Constitutional: Negative.   HENT: Negative.    Eyes: Negative.   Respiratory: Negative.    Cardiovascular:  Positive for claudication.  Gastrointestinal:  Positive for abdominal pain.  Genitourinary:  Positive for frequency.  Skin: Negative.   Neurological:  Positive for dizziness.  Psychiatric/Behavioral: Negative.       Physical Exam Blood  pressure 135/84, pulse 96, temperature 98 F (36.7 C), temperature source Oral, height '5\' 9"'$  (1.753 m), weight 209 lb (94.8 kg), SpO2 98 %. Last Weight  Most recent update: 05/11/2021  3:23 PM    Weight  94.8 kg (209 lb)             CONSTITUTIONAL: Well developed, and nourished, appropriately responsive and aware without distress.   EYES: Sclera non-icteric.   EARS, NOSE, MOUTH AND THROAT: Mask worn.   Hearing is intact to voice.  NECK: Trachea is midline, and there is no jugular venous distension.  LYMPH NODES:  Lymph nodes in the neck are not enlarged. RESPIRATORY:  Lungs are clear, and breath sounds are equal bilaterally. Normal respiratory effort without pathologic use of accessory muscles. CARDIOVASCULAR: Heart is regular in rate and rhythm. GI: The abdomen is soft, nontender, and nondistended. There were no palpable masses. I did not appreciate hepatosplenomegaly. There were normal bowel sounds. GU: Widely patent right inguinal canal with palpable hernia readily apparent.  No evidence of left groin hernia.  Testes descended bilaterally, normal male phallus. MUSCULOSKELETAL:  Symmetrical muscle tone appreciated in all four extremities.    SKIN: Skin turgor is normal. No pathologic skin lesions appreciated.  NEUROLOGIC:  Motor and sensation appear grossly normal.  Cranial nerves are grossly without defect. PSYCH:  Alert and oriented to person, place and time. Affect is appropriate for situation.  Data Reviewed I have personally reviewed what is currently available of the patient's imaging, recent labs and medical records.   Labs:  CBC Latest Ref Rng & Units 07/04/2020 07/09/2019 03/21/2017  WBC 3.4 - 10.8 x10E3/uL 5.2 6.1 5.8  Hemoglobin 13.0 - 17.7 g/dL 15.7 15.3 15.1  Hematocrit 37.5 - 51.0 % 46.7 44.1 45.6  Platelets 150 - 450 x10E3/uL 200 176 150   CMP Latest Ref Rng & Units 07/04/2020 07/09/2019 03/21/2017  Glucose 65 - 99 mg/dL 102(H) 86 97  BUN 8 - 27 mg/dL '15 12 15   '$ Creatinine 0.76 - 1.27 mg/dL 0.95 0.86 0.86  Sodium 134 - 144 mmol/L 141 142 142  Potassium 3.5 - 5.2 mmol/L 4.5 4.2 5.2  Chloride 96 - 106 mmol/L 103 104 104  CO2 20 - 29 mmol/L '22 24 24  '$ Calcium 8.6 - 10.2 mg/dL 9.8 9.4 9.0  Total Protein 6.0 - 8.5 g/dL 7.0 7.1 6.8  Total Bilirubin 0.0 - 1.2 mg/dL 0.8 1.3(H) 1.0  Alkaline Phos 44 - 121 IU/L 128(H) 99 102  AST 0 - 40 IU/L '22 23 28  '$ ALT 0 - 44 IU/L '20 18 20      '$ Imaging:  Within last 24 hrs: No results found.  Assessment     Patient Active Problem List   Diagnosis Date Noted   Right inguinal hernia 05/11/2021   Personal history of tobacco use, presenting hazards to health 01/26/2016   Quadriceps muscle rupture 05/04/2015   Atherosclerosis of coronary artery 01/27/2015   Clinical depression 01/27/2015   Acid reflux 01/27/2015   History of tobacco use 01/27/2015   HLD (hyperlipidemia) 01/27/2015   BP (high blood pressure) 01/27/2015   Fatty tumor 01/27/2015   Obstructive sleep apnea 01/27/2015    Plan    Robotic assisted laparoscopic right inguinal hernia repair.  I discussed possibility of incarceration, strangulation, enlargement in size over time, and the need for emergency surgery in the face of these.  Also reviewed the techniques of reduction should incarceration occur, and when unsuccessful to present to the ED.  Also discussed that surgery risks include recurrence which can be up to 30% in the case of complex hernias, use of prosthetic materials (mesh) and the increased risk of infection and the possible need for re-operation and removal of mesh, possibility of post-op SBO or ileus, and the risks of general anesthetic including heart attack, stroke, sudden death or some reaction to anesthetic medications. The patient, and those present, appear to understand the risks, any and all questions were answered to the patient's satisfaction.  No guarantees were ever expressed or implied.   Face-to-face time spent with the  patient and accompanying care providers(if present) was 30 minutes, with more than 50% of the time spent counseling, educating, and coordinating care of the patient.    These notes generated with voice recognition software. I apologize for typographical errors.  Ronny Bacon M.D., FACS 05/11/2021, 3:49 PM

## 2021-05-11 NOTE — Patient Instructions (Addendum)
You have chose to have your hernia repaired. This will be done by Dr. Christian Mate at [facility].  Please see your (blue) Pre-care information that you have been given today. Our surgery scheduler will call you to look at surgery dates and to go over information.   You will need to arrange to be out of work for 2 weeks and then return with a lifting restrictions for 4 more weeks. Please send any FMLA paperwork prior to surgery and we will fill this out and fax it back to your employer within 3 business days.  You may have a bruise in your groin and also swelling and brusing in your testicle area. You may use ice 4-5 times daily for 15-20 minutes each time. Make sure that you place a barrier between you and the ice pack. To decrease the swelling, you may roll up a bath towel and place it vertically in between your thighs with your testicles resting on the towel. You will want to keep this area elevated as much as possible for several days following surgery.    Inguinal Hernia, Adult Muscles help keep everything in the body in its proper place. But if a weak spot in the muscles develops, something can poke through. That is called a hernia. When this happens in the lower part of the belly (abdomen), it is called an inguinal hernia. (It takes its name from a part of the body in this region called the inguinal canal.) A weak spot in the wall of muscles lets some fat or part of the small intestine bulge through. An inguinal hernia can develop at any age. Men get them more often than women. CAUSES  In adults, an inguinal hernia develops over time. It can be triggered by: Suddenly straining the muscles of the lower abdomen. Lifting heavy objects. Straining to have a bowel movement. Difficult bowel movements (constipation) can lead to this. Constant coughing. This may be caused by smoking or lung disease. Being overweight. Being pregnant. Working at a job that requires long periods of standing or heavy  lifting. Having had an inguinal hernia before. One type can be an emergency situation. It is called a strangulated inguinal hernia. It develops if part of the small intestine slips through the weak spot and cannot get back into the abdomen. The blood supply can be cut off. If that happens, part of the intestine may die. This situation requires emergency surgery. SYMPTOMS  Often, a small inguinal hernia has no symptoms. It is found when a healthcare provider does a physical exam. Larger hernias usually have symptoms.  In adults, symptoms may include: A lump in the groin. This is easier to see when the person is standing. It might disappear when lying down. In men, a lump in the scrotum. Pain or burning in the groin. This occurs especially when lifting, straining or coughing. A dull ache or feeling of pressure in the groin. Signs of a strangulated hernia can include: A bulge in the groin that becomes very painful and tender to the touch. A bulge that turns red or purple. Fever, nausea and vomiting. Inability to have a bowel movement or to pass gas. DIAGNOSIS  To decide if you have an inguinal hernia, a healthcare provider will probably do a physical examination. This will include asking questions about any symptoms you have noticed. The healthcare provider might feel the groin area and ask you to cough. If an inguinal hernia is felt, the healthcare provider may try to slide it back  into the abdomen. Usually no other tests are needed. TREATMENT  Treatments can vary. The size of the hernia makes a difference. Options include: Watchful waiting. This is often suggested if the hernia is small and you have had no symptoms. No medical procedure will be done unless symptoms develop. You will need to watch closely for symptoms. If any occur, contact your healthcare provider right away. Surgery. This is used if the hernia is larger or you have symptoms. Open surgery. This is usually an outpatient  procedure (you will not stay overnight in a hospital). An cut (incision) is made through the skin in the groin. The hernia is put back inside the abdomen. The weak area in the muscles is then repaired by herniorrhaphy or hernioplasty. Herniorrhaphy: in this type of surgery, the weak muscles are sewn back together. Hernioplasty: a patch or mesh is used to close the weak area in the abdominal wall. Laparoscopy. In this procedure, a surgeon makes small incisions. A thin tube with a tiny video camera (called a laparoscope) is put into the abdomen. The surgeon repairs the hernia with mesh by looking with the video camera and using two long instruments. HOME CARE INSTRUCTIONS  After surgery to repair an inguinal hernia: You will need to take pain medicine prescribed by your healthcare provider. Follow all directions carefully. You will need to take care of the wound from the incision. Your activity will be restricted for awhile. This will probably include no heavy lifting for several weeks. You also should not do anything too active for a few weeks. When you can return to work will depend on the type of job that you have. During "watchful waiting" periods, you should: Maintain a healthy weight. Eat a diet high in fiber (fruits, vegetables and whole grains). Drink plenty of fluids to avoid constipation. This means drinking enough water and other liquids to keep your urine clear or pale yellow. Do not lift heavy objects. Do not stand for long periods of time. Quit smoking. This should keep you from developing a frequent cough. SEEK MEDICAL CARE IF:  A bulge develops in your groin area. You feel pain, a burning sensation or pressure in the groin. This might be worse if you are lifting or straining. You develop a fever of more than 100.5 F (38.1 C). SEEK IMMEDIATE MEDICAL CARE IF:  Pain in the groin increases suddenly. A bulge in the groin gets bigger suddenly and does not go down. For men, there is  sudden pain in the scrotum. Or, the size of the scrotum increases. A bulge in the groin area becomes red or purple and is painful to touch. You have nausea or vomiting that does not go away. You feel your heart beating much faster than normal. You cannot have a bowel movement or pass gas. You develop a fever of more than 102.0 F (38.9 C).   This information is not intended to replace advice given to you by your health care provider. Make sure you discuss any questions you have with your health care provider.   Document Released: 12/30/2008 Document Revised: 11/05/2011 Document Reviewed: 02/14/2015 Elsevier Interactive Patient Education [employer].

## 2021-05-15 ENCOUNTER — Telehealth: Payer: Self-pay | Admitting: Surgery

## 2021-05-15 NOTE — Telephone Encounter (Signed)
Patient has been advised of Pre-Admission date/time, COVID Testing date and Surgery date.  Surgery Date: 05/31/21 Preadmission Testing Date: 05/25/21 (phone 8a-1p) Covid Testing Date: Not needed.     Patient has been made aware to call [phone], between 1-3:00pm the day before surgery, to find out what time to arrive for surgery.

## 2021-05-18 ENCOUNTER — Ambulatory Visit: Payer: Medicare HMO | Admitting: Surgery

## 2021-05-22 DIAGNOSIS — H2513 Age-related nuclear cataract, bilateral: Secondary | ICD-10-CM | POA: Diagnosis not present

## 2021-05-22 DIAGNOSIS — Z0100 Encounter for examination of eyes and vision without abnormal findings: Secondary | ICD-10-CM | POA: Diagnosis not present

## 2021-05-24 DIAGNOSIS — G4733 Obstructive sleep apnea (adult) (pediatric): Secondary | ICD-10-CM | POA: Diagnosis not present

## 2021-05-25 ENCOUNTER — Other Ambulatory Visit: Admission: RE | Admit: 2021-05-25 | Payer: Medicare HMO | Source: Ambulatory Visit

## 2021-05-26 ENCOUNTER — Other Ambulatory Visit: Payer: Self-pay

## 2021-05-26 ENCOUNTER — Telehealth: Payer: Self-pay

## 2021-05-26 ENCOUNTER — Other Ambulatory Visit
Admission: RE | Admit: 2021-05-26 | Discharge: 2021-05-26 | Disposition: A | Discharge status: Home / Self Care | Payer: Medicare HMO | Source: Ambulatory Visit | Attending: Surgery | Admitting: Surgery

## 2021-05-26 ENCOUNTER — Inpatient Hospital Stay: Admission: RE | Admit: 2021-05-26 | Payer: Medicare HMO | Source: Ambulatory Visit

## 2021-05-26 ENCOUNTER — Encounter (HOSPITAL_COMMUNITY): Payer: Self-pay | Admitting: Urgent Care

## 2021-05-26 ENCOUNTER — Other Ambulatory Visit: Payer: Self-pay | Admitting: Urgent Care

## 2021-05-26 DIAGNOSIS — E785 Hyperlipidemia, unspecified: Secondary | ICD-10-CM | POA: Diagnosis not present

## 2021-05-26 DIAGNOSIS — Z01818 Encounter for other preprocedural examination: Secondary | ICD-10-CM | POA: Diagnosis not present

## 2021-05-26 DIAGNOSIS — Z87891 Personal history of nicotine dependence: Secondary | ICD-10-CM | POA: Insufficient documentation

## 2021-05-26 DIAGNOSIS — I2510 Atherosclerotic heart disease of native coronary artery without angina pectoris: Secondary | ICD-10-CM | POA: Diagnosis not present

## 2021-05-26 DIAGNOSIS — K4090 Unilateral inguinal hernia, without obstruction or gangrene, not specified as recurrent: Secondary | ICD-10-CM | POA: Diagnosis not present

## 2021-05-26 DIAGNOSIS — I10 Essential (primary) hypertension: Secondary | ICD-10-CM | POA: Diagnosis not present

## 2021-05-26 DIAGNOSIS — I4891 Unspecified atrial fibrillation: Secondary | ICD-10-CM | POA: Diagnosis not present

## 2021-05-26 DIAGNOSIS — R799 Abnormal finding of blood chemistry, unspecified: Secondary | ICD-10-CM | POA: Diagnosis not present

## 2021-05-26 DIAGNOSIS — I4819 Other persistent atrial fibrillation: Secondary | ICD-10-CM | POA: Diagnosis not present

## 2021-05-26 HISTORY — DX: Hyperlipidemia, unspecified: E78.5

## 2021-05-26 LAB — CBC WITH DIFFERENTIAL/PLATELET
Abs Immature Granulocytes: 0.02 10*3/uL (ref 0.00–0.07)
Basophils Absolute: 0.1 10*3/uL (ref 0.0–0.1)
Basophils Relative: 1 %
Eosinophils Absolute: 0.2 10*3/uL (ref 0.0–0.5)
Eosinophils Relative: 4 %
HCT: 45.7 % (ref 39.0–52.0)
Hemoglobin: 16.1 g/dL (ref 13.0–17.0)
Immature Granulocytes: 0 %
Lymphocytes Relative: 25 %
Lymphs Abs: 1.5 10*3/uL (ref 0.7–4.0)
MCH: 30.8 pg (ref 26.0–34.0)
MCHC: 35.2 g/dL (ref 30.0–36.0)
MCV: 87.5 fL (ref 80.0–100.0)
Monocytes Absolute: 0.6 10*3/uL (ref 0.1–1.0)
Monocytes Relative: 10 %
Neutro Abs: 3.5 10*3/uL (ref 1.7–7.7)
Neutrophils Relative %: 60 %
Platelets: 198 10*3/uL (ref 150–400)
RBC: 5.22 MIL/uL (ref 4.22–5.81)
RDW: 17.5 % — ABNORMAL HIGH (ref 11.5–15.5)
WBC: 5.9 10*3/uL (ref 4.0–10.5)
nRBC: 0 % (ref 0.0–0.2)

## 2021-05-26 LAB — COMPREHENSIVE METABOLIC PANEL
ALT: 21 U/L (ref 0–44)
AST: 25 U/L (ref 15–41)
Albumin: 4.2 g/dL (ref 3.5–5.0)
Alkaline Phosphatase: 91 U/L (ref 38–126)
Anion gap: 9 (ref 5–15)
BUN: 16 mg/dL (ref 8–23)
CO2: 29 mmol/L (ref 22–32)
Calcium: 9.4 mg/dL (ref 8.9–10.3)
Chloride: 100 mmol/L (ref 98–111)
Creatinine, Ser: 0.89 mg/dL (ref 0.61–1.24)
GFR, Estimated: >60 mL/min (ref >60)
Glucose, Bld: 107 mg/dL — ABNORMAL HIGH (ref 70–99)
Potassium: 3 mmol/L — ABNORMAL LOW (ref 3.5–5.1)
Sodium: 138 mmol/L (ref 135–145)
Total Bilirubin: 1.4 mg/dL — ABNORMAL HIGH (ref 0.3–1.2)
Total Protein: 7.6 g/dL (ref 6.5–8.1)

## 2021-05-26 NOTE — Progress Notes (Signed)
  Perioperative Services Pre-Admission/Anesthesia Testing    Date: 05/26/21  Name: Louis Luna MRN:   [id number]  Re: Need for cardiology evaluation and clearance  Planned Surgical Procedure(s):   Case: [id number] Date/Time: 05/31/21 1145  Procedure: XI ROBOTIC ASSISTED INGUINAL HERNIA (Right)  Anesthesia type: General  Pre-op diagnosis: right inguinal hernia  Location: [facility] OR ROOM 06 / [place] [facility]  Surgeons: Ronny Bacon, MD   Clinical Notes:  Patient scheduled for the above procedure on 05/31/2021 with Dr. Ronny Bacon.  In preparation for his procedure, patient presented to the PAT clinic on the morning of 05/26/2021 for preoperative lab testing and ECG.  In review of his ECG, it is noted that patient is in new onset atrial fibrillation at a rate of 98 bpm.  Additionally, there are questionable inferior ST and T wave abnormalities. Today's tracing was compared to ECG obtained on 04/21/2009 that revealed NSR.  Patient with the following cardiovascular risk factors: CAD, HTN, HLD, OSHA, former smoker (30 pack years; quit 10/2011), ETOH use, obesity (BMI 30.9). There does not appear to be a family history of heart disease.      Impression and Plan:  Louis Luna presents to the PAT clinic where he was found to be in new onset atrial fibrillation.  Will refer to cardiology for evaluation and preoperative clearance.  Anticipate need for TTE and possible ischemic work-up based on ECG findings, however this testing will be at the discretion of the attending cardiology provider.  Patient currently scheduled for surgery on 05/31/2021.  This did not leave adequate time for patient to be seen by cardiology and undergo any testing that is deemed necessary.  Surgical office has been notified that case will need to be postponed pending cardiology evaluation and clearance.  PAT RN to make patient aware.  Referral order entered into [facility].  We will follow-up on details  regarding cardiology appointment, need for noninvasive testing, and subsequent clearance for elective hernia surgery.  Orders Placed This Encounter  Procedures   Ambulatory referral to Cardiology    Referral Priority:   Routine    Referral Type:   Consultation    Referral Reason:   Specialty Services Required Re: New onset atrial fibrillation and questionable inferior ischemic changes.     Requested Specialty:   Cardiology    Number of Visits Requested:   Athena, MSN, APRN, FNP-C, CEN [facility]  Peri-operative Services Nurse Practitioner Phone: [phone] 05/26/21 10:57 AM  NOTE: This note has been prepared using Dragon dictation software. Despite my best ability to proofread, there is always the potential that unintentional transcriptional errors may still occur from this process.

## 2021-05-26 NOTE — Pre-Procedure Instructions (Signed)
Called pt multiple times today to inform him on why his surgery has been cancelled.  I have left multiple messages each time to return my call. Pt has called back and I have been on the phone with other patients . I call him back and he never answers. Left last message today informing him his surgery has been cancelled and that he will need to f/u Monday with Dr Shawna Clamp office on why since he never answers when I call

## 2021-05-26 NOTE — Telephone Encounter (Signed)
Unable to leave vm.

## 2021-05-26 NOTE — Progress Notes (Signed)
  [facility] Perioperative Services: Pre-Admission/Anesthesia Testing  Abnormal Lab Notification   Date: 05/26/21  Name: Louis Luna MRN:   [id number]  Re: Abnormal labs noted during PAT appointment   Provider(s) Notified: Ronny Bacon, MD Notification mode: Routed and/or faxed via [facility]   ABNORMAL LAB VALUE(S): Lab Results  Component Value Date   K 3.0 (L) 05/26/2021   Notes:  Patient is scheduled for a XI ROBOTIC ASSISTED INGUINAL HERNIA on 05/31/2021.  In review of his EMR, it is noted the patient is on daily diuretic therapy (chlorthalidone 25 mg).  We will send result to primary attending surgeon for review and optimization.  Order placed to have K+ level rechecked on the day of surgery to ensure optimization.   This is a Community education officer; no formal response is required.  Honor Loh, MSN, APRN, FNP-C, CEN [facility]  Peri-operative Services Nurse Practitioner Phone: [phone] Fax: [phone] 05/26/21 12:20 PM

## 2021-05-26 NOTE — Telephone Encounter (Signed)
-----   Message from Karen Kitchens, NP sent at 05/26/2021 10:58 AM EDT ----- Regarding: New patient New onset atrial fibrillation with questionable associated inferior ischemic changes. Will be a new patient. Needs preoperative evaluation and clearance. Surgery scheduled for 10/5, however I have requested it to be postponed pending cardiology evaluation/testing.   Honor Loh, MSN, APRN, FNP-C, CEN [facility]  Peri-operative Services Nurse Practitioner Phone: [phone] 05/26/21 10:58 AM

## 2021-05-26 NOTE — Pre-Procedure Instructions (Signed)
Unable to reach pt for phone call on 05-25-21 to do anesthesia interview . Pt showed up in PAT today so went ahead and did labwork and ekg. PT in new onset A fib-Surgery has been cancelled by office as pt is needing cardiac w/u to be cleared for surgery. Phone interview for Anesthesia has not been done since pt sx is now cancelled

## 2021-05-29 ENCOUNTER — Telehealth: Payer: Self-pay

## 2021-05-29 DIAGNOSIS — R9431 Abnormal electrocardiogram [ECG] [EKG]: Secondary | ICD-10-CM

## 2021-05-29 DIAGNOSIS — K4090 Unilateral inguinal hernia, without obstruction or gangrene, not specified as recurrent: Secondary | ICD-10-CM

## 2021-05-29 NOTE — Telephone Encounter (Signed)
I called and spoke with patient. He states his upcomming surgery has been cancelled due to an abnormal EKG. Patient says that no one has told him what the abnormal finding was. Patient wants Dr. Rosanna Randy to review the EKG and let him know what is going on. I advised patient that Dr. Rosanna Randy is out of the office, and will return on 05/31/2021. Patient verbalized understanding. I called [facility] and was advised that patients pre op EKG showed new onset A fib. Per [facility], patients surgery has been cancelled and Cardiology referral order has been placed. [facility] states that Cardiology has already tried reaching patient and left message to call back. I called patient back and gave him the number to [facility] heart [facility]. Patient agrees to call them back. Patient would still like for Dr. Rosanna Randy to review EKG report when he returns to the office on 05/31/2021.

## 2021-05-29 NOTE — Telephone Encounter (Signed)
Copied from [place] [phone]. Topic: General - Other >> May 29, 2021 12:08 PM Leward Quan A wrote: Reason for CRM: Patient called in to inquire of Dr Rosanna Randy about an issue with an abnormal EKG. Patient is very concerned and asking if Dr Rosanna Randy can please reach out to him ASAP [phone]

## 2021-05-29 NOTE — Telephone Encounter (Signed)
Patient called office-said he just spoke with pre-admit-he had his appointment this past Friday and was told he has an abnormal EKG- Brain with Pre-admit will place referral to cardiology-surgery has been cancelled until further cardiac workup per [facility].

## 2021-05-30 ENCOUNTER — Other Ambulatory Visit: Payer: Self-pay

## 2021-05-30 ENCOUNTER — Encounter: Payer: Self-pay | Admitting: Cardiology

## 2021-05-30 ENCOUNTER — Ambulatory Visit: Payer: Medicare HMO | Admitting: Cardiology

## 2021-05-30 VITALS — BP 136/90 | HR 82 | Ht 69.0 in | Wt 212.0 lb

## 2021-05-30 DIAGNOSIS — Z01810 Encounter for preprocedural cardiovascular examination: Secondary | ICD-10-CM

## 2021-05-30 DIAGNOSIS — I10 Essential (primary) hypertension: Secondary | ICD-10-CM

## 2021-05-30 DIAGNOSIS — E7800 Pure hypercholesterolemia, unspecified: Secondary | ICD-10-CM | POA: Diagnosis not present

## 2021-05-30 DIAGNOSIS — Z01818 Encounter for other preprocedural examination: Secondary | ICD-10-CM

## 2021-05-30 DIAGNOSIS — I4819 Other persistent atrial fibrillation: Secondary | ICD-10-CM | POA: Diagnosis not present

## 2021-05-30 MED ORDER — RIVAROXABAN 20 MG PO TABS: 20.0000 mg | ORAL_TABLET | Freq: Every day | ORAL | 5 refills | Status: DC

## 2021-05-30 NOTE — Progress Notes (Signed)
Cardiology Office Note:    Date:  05/30/2021   ID:  ERDEM NAAS, DOB 12/27/1950, MRN [id number]  PCP:  Jerrol Banana., MD   [facility] Providers Cardiologist:  None     Referring MD: Ronny Bacon, MD   Chief Complaint  Patient presents with   New Patient (Initial Visit)    Referred for New onset atrial fibrillation with questionable associated inferior ischemic changes.  preoperative evaluation and clearance needed. Meds reviewed verbally with patient.     History of Present Illness:    Louis Luna is a 70 y.o. male with a hx of hypertension, hyperlipidemia, former smoker x20+ years who presents with new onset A. fib and preop evaluation.  Patient recently diagnosed with inguinal hernia, surgical management being planned.  Preop eval/EKG: 05/26/2021 showed new onset atrial fibrillation with ST-T abnormalities.  He denies chest pain, shortness of breath, palpitations, dizziness.  Denies any history of heart disease, denies strokes, denies CAD or peripheral arterial disease.  Surgery for inguinal hernia repair was scheduled for tomorrow, this has been canceled pending cardiac work-up.  Past Medical History:  Diagnosis Date   Depression    GERD (gastroesophageal reflux disease)    H/O elevated lipids    HLD (hyperlipidemia)    Hypertension    Personal history of tobacco use, presenting hazards to health 01/26/2016   Sleep apnea     Past Surgical History:  Procedure Laterality Date   ANKLE SURGERY     APPENDECTOMY     COLONOSCOPY N/A 03/23/2016   Procedure: COLONOSCOPY;  Surgeon: Manya Silvas, MD;  Location: [place];  Service: Endoscopy;  Laterality: N/A;   HERNIA REPAIR     inguinal-left 04/1979   QUADRICEPS TENDON REPAIR Left 05/05/2015   Procedure: REPAIR QUADRICEP TENDON;  Surgeon: Hessie Knows, MD;  Location: [facility];  Service: Orthopedics;  Laterality: Left;   TUMOR EXCISION     from Rt side of the neck-benign    Current  Medications: Current Meds  Medication Sig   amLODipine (NORVASC) 5 MG tablet TAKE 1 TABLET(5 MG) BY MOUTH DAILY   atenolol (TENORMIN) 50 MG tablet TAKE 1 TABLET BY MOUTH EVERY DAY   chlorthalidone (HYGROTON) 25 MG tablet TAKE 1 TABLET(25 MG) BY MOUTH EVERY MORNING   cyclobenzaprine (FLEXERIL) 10 MG tablet Take 1 tablet (10 mg total) by mouth 3 (three) times daily as needed for muscle spasms.   diazepam (VALIUM) 5 MG tablet TAKE 1 TABLET BY MOUTH ONCE DAILY IF NEEDED FOR ANXIETY   docusate sodium (COLACE) 100 MG capsule Take 200 mg by mouth daily.   Doxylamine Succinate, Sleep, (SLEEP AID PO) Take 2 tablets by mouth at bedtime.   Ergocalciferol (VITAMIN D2) 10 MCG (400 UNIT) TABS Take 400 Units by mouth daily.   Homeopathic Products [facility] RELIEF EX) Apply 1 application topically daily as needed (muscle cramps).   ibuprofen (ADVIL) 200 MG tablet Take 400 mg by mouth every 8 (eight) hours as needed for moderate pain.   meloxicam (MOBIC) 15 MG tablet Take 15 mg by mouth daily as needed for pain.   Misc Natural Products (BRAINSTRONG MEMORY SUPPORT PO) Take 1 tablet by mouth daily. Brain Health   Misc Natural Products (PROSTATE HEALTH PO) Take 1 capsule by mouth in the morning and at bedtime. Super Beta Prostate   Multiple Vitamin (MULTIVITAMIN WITH MINERALS) TABS tablet Take 1 tablet by mouth daily.   psyllium (METAMUCIL SMOOTH TEXTURE) 28 % packet Take 1 packet by mouth  2 (two) times daily.   rivaroxaban (XARELTO) 20 MG TABS tablet Take 1 tablet (20 mg total) by mouth daily with supper.   sertraline (ZOLOFT) 50 MG tablet TAKE 1 TABLET BY MOUTH EVERY DAY   tetrahydrozoline-zinc (VISINE-AC) 0.05-0.25 % ophthalmic solution Place 1 drop into both eyes 3 (three) times daily as needed (allergies).   [DISCONTINUED] aspirin EC 81 MG tablet Take 81 mg by mouth daily.     Allergies:   Patient has no known allergies.   Social History   Socioeconomic History   Marital status: Married    Spouse  name: Louis Luna   Number of children: 2   Years of education: 18   Highest education level: Not on file  Occupational History   Occupation: retired  Tobacco Use   Smoking status: Former    Packs/day: 1.00    Years: 30.00    Pack years: 30.00    Types: Cigarettes    Quit date: 10/26/2011    Years since quitting: 9.6   Smokeless tobacco: Never  Vaping Use   Vaping Use: Never used  Substance and Sexual Activity   Alcohol use: Yes    Comment: ocassionally-beer   Drug use: No   Sexual activity: Never    Birth control/protection: None  Other Topics Concern   Not on file  Social History Narrative   Not on file   Social Determinants of Health   Financial Resource Strain: Not on file  Food Insecurity: Not on file  Transportation Needs: Not on file  Physical Activity: Not on file  Stress: Not on file  Social Connections: Not on file     Family History: The patient's family history includes Alcohol abuse in his brother and father; Alzheimer's disease in his father; Breast cancer in his mother, sister, and sister; Depression in his sister and sister; Diabetes in his mother; Hypertension in his brother, brother, father, and mother; Melanoma in his sister.  ROS:   Please see the history of present illness.     All other systems reviewed and are negative.  EKGs/Labs/Other Studies Reviewed:    The following studies were reviewed today:   EKG:  EKG is  ordered today.  The ekg ordered today demonstrates atrial fibrillation heart rate 82  Recent Labs: 07/04/2020: TSH 0.976 05/26/2021: ALT 21; BUN 16; Creatinine, Ser 0.89; Hemoglobin 16.1; Platelets 198; Potassium 3.0; Sodium 138  Recent Lipid Panel    Component Value Date/Time   CHOL 266 (H) 07/04/2020 0918   TRIG 166 (H) 07/04/2020 0918   HDL 42 07/04/2020 0918   CHOLHDL 6.3 (H) 07/04/2020 0918   LDLCALC 193 (H) 07/04/2020 0918     Risk Assessment/Calculations:          Physical Exam:    VS:  BP 136/90 (BP Location:  Left Arm, Patient Position: Sitting, Cuff Size: Normal)   Pulse 82   Ht 5\' 9"  (1.753 m)   Wt 212 lb (96.2 kg)   SpO2 96%   BMI 31.31 kg/m     Wt Readings from Last 3 Encounters:  05/30/21 212 lb (96.2 kg)  05/11/21 209 lb (94.8 kg)  05/08/21 209 lb (94.8 kg)     GEN:  Well nourished, well developed in no acute distress HEENT: Normal NECK: No JVD; No carotid bruits LYMPHATICS: No lymphadenopathy CARDIAC: Irregular irregular RESPIRATORY:  Clear to auscultation without rales, wheezing or rhonchi  ABDOMEN: Soft, non-tender, non-distended MUSCULOSKELETAL:  No edema; No deformity  SKIN: Warm and dry NEUROLOGIC:  Alert  and oriented x 3 PSYCHIATRIC:  Normal affect   ASSESSMENT:    1. Persistent atrial fibrillation ([place])   2. Primary hypertension   3. Pure hypercholesterolemia   4. Pre-op evaluation    PLAN:    In order of problems listed above:  Persistent A. fib, CHA2DS2-VASc score 2 (htn, age).  Heart rate controlled, continue atenolol 50 mg daily, start Xarelto 20 mg daily.  Get echocardiogram.  Patient is otherwise asymptomatic.  If echo is normal, okay to proceed with surgical procedure.  We will plan DC cardioversion at follow-up visit after surgical procedure when patient has been on uninterrupted anticoagulation. Hypertension, BP controlled.  Continue chlorthalidone, amlodipine, atenolol. Hyperlipidemia, obtain fasting lipid profile today.  If remains abnormal, plan to start statin. Preop eval, inguinal hernia being planned.  Echocardiogram as above, if echo has no gross abnormalities, okay to proceed with surgical procedure.  Follow-up in 3 months after echocardiogram.  Plan DC cardioversion at follow-up visit if patient still in A. fib.      Medication Adjustments/Labs and Tests Ordered: Current medicines are reviewed at length with the patient today.  Concerns regarding medicines are outlined above.  Orders Placed This Encounter  Procedures   Lipid panel   EKG  12-Lead   ECHOCARDIOGRAM COMPLETE    Meds ordered this encounter  Medications   rivaroxaban (XARELTO) 20 MG TABS tablet    Sig: Take 1 tablet (20 mg total) by mouth daily with supper.    Dispense:  30 tablet    Refill:  5     Patient Instructions  Medication Instructions:   Your physician has recommended you make the following change in your medication:    START taking Xarelto 20 MG once a day.  2.    STOP taking your Aspirin.    *If you need a refill on your cardiac medications before your next appointment, please call your pharmacy*   Lab Work:  Fasting Lipid to be drawn today.  If you have labs (blood work) drawn today and your tests are completely normal, you will receive your results only by: [place] (if you have MyChart) OR A paper copy in the mail If you have any lab test that is abnormal or we need to change your treatment, we will call you to review the results.   Testing/Procedures:  Your physician has requested that you have an echocardiogram. Echocardiography is a painless test that uses sound waves to create images of your heart. It provides your doctor with information about the size and shape of your heart and how well your heart's chambers and valves are working. This procedure takes approximately one hour. There are no restrictions for this procedure.   Follow-Up: At [facility], you and your health needs are our priority.  As part of our continuing mission to provide you with exceptional heart care, we have created designated Provider Care Teams.  These Care Teams include your primary Cardiologist (physician) and Advanced Practice Providers (APPs -  Physician Assistants and Nurse Practitioners) who all work together to provide you with the care you need, when you need it.  We recommend signing up for the patient portal called "MyChart".  Sign up information is provided on this After Visit Summary.  MyChart is used to connect with patients for  Virtual Visits (Telemedicine).  Patients are able to view lab/test results, encounter notes, upcoming appointments, etc.  Non-urgent messages can be sent to your provider as well.   To learn more about what  you can do with MyChart, go to [email or web address].    Your next appointment:   3 month(s)  The format for your next appointment:   In Person  Provider:   Kate Sable, MD   Other Instructions    Signed, Kate Sable, MD  05/30/2021 10:32 AM    [place]

## 2021-05-30 NOTE — Patient Instructions (Addendum)
Medication Instructions:   Your physician has recommended you make the following change in your medication:    START taking Xarelto 20 MG once a day.  2.    STOP taking your Aspirin.    *If you need a refill on your cardiac medications before your next appointment, please call your pharmacy*   Lab Work:  Fasting Lipid to be drawn today.  If you have labs (blood work) drawn today and your tests are completely normal, you will receive your results only by: [place] (if you have MyChart) OR A paper copy in the mail If you have any lab test that is abnormal or we need to change your treatment, we will call you to review the results.   Testing/Procedures:  Your physician has requested that you have an echocardiogram. Echocardiography is a painless test that uses sound waves to create images of your heart. It provides your doctor with information about the size and shape of your heart and how well your heart's chambers and valves are working. This procedure takes approximately one hour. There are no restrictions for this procedure.   Follow-Up: At [facility], you and your health needs are our priority.  As part of our continuing mission to provide you with exceptional heart care, we have created designated Provider Care Teams.  These Care Teams include your primary Cardiologist (physician) and Advanced Practice Providers (APPs -  Physician Assistants and Nurse Practitioners) who all work together to provide you with the care you need, when you need it.  We recommend signing up for the patient portal called "MyChart".  Sign up information is provided on this After Visit Summary.  MyChart is used to connect with patients for Virtual Visits (Telemedicine).  Patients are able to view lab/test results, encounter notes, upcoming appointments, etc.  Non-urgent messages can be sent to your provider as well.   To learn more about what you can do with MyChart, go to [email or web address].     Your next appointment:   3 month(s)  The format for your next appointment:   In Person  Provider:   Kate Sable, MD   Other Instructions

## 2021-05-31 ENCOUNTER — Encounter: Admission: RE | Payer: Self-pay | Source: Home / Self Care

## 2021-05-31 ENCOUNTER — Ambulatory Visit: Admission: RE | Admit: 2021-05-31 | Payer: Medicare HMO | Source: Home / Self Care | Admitting: Surgery

## 2021-05-31 SURGERY — HERNIORRHAPHY, INGUINAL, ROBOT-ASSISTED, LAPAROSCOPIC
Anesthesia: General | Laterality: Right

## 2021-06-01 ENCOUNTER — Other Ambulatory Visit: Payer: Self-pay

## 2021-06-01 ENCOUNTER — Telehealth: Payer: Self-pay | Admitting: *Deleted

## 2021-06-01 ENCOUNTER — Other Ambulatory Visit (INDEPENDENT_AMBULATORY_CARE_PROVIDER_SITE_OTHER): Payer: Medicare HMO

## 2021-06-01 DIAGNOSIS — E7800 Pure hypercholesterolemia, unspecified: Secondary | ICD-10-CM

## 2021-06-01 NOTE — Telephone Encounter (Signed)
Patient returned call: Potassium low.  Start KCl 10 mEq 2 daily.  We will follow-up potassium on next office visit please advise patient.  Patient notified of test results and provider recommendations.  Patient does not have follow up in office- please advise and schedule. Patient states medication may be forwarded to his pharmacy

## 2021-06-02 LAB — LIPID PANEL
Chol/HDL Ratio: 6 ratio — ABNORMAL HIGH (ref 0.0–5.0)
Cholesterol, Total: 233 mg/dL — ABNORMAL HIGH (ref 100–199)
HDL: 39 mg/dL — ABNORMAL LOW (ref >39)
LDL Chol Calc (NIH): 166 mg/dL — ABNORMAL HIGH (ref 0–99)
Triglycerides: 153 mg/dL — ABNORMAL HIGH (ref 0–149)
VLDL Cholesterol Cal: 28 mg/dL (ref 5–40)

## 2021-06-05 ENCOUNTER — Telehealth: Payer: Self-pay

## 2021-06-05 MED ORDER — ATORVASTATIN CALCIUM 40 MG PO TABS: 40.0000 mg | ORAL_TABLET | Freq: Every day | ORAL | 5 refills | Status: DC

## 2021-06-05 NOTE — Telephone Encounter (Signed)
Called patient and left a detailed VM per DPR on file. Sent prescription into patients pharmacy and encouraged patient to call back with any questions or concerns.

## 2021-06-05 NOTE — Telephone Encounter (Signed)
-----   Message from Kate Sable, MD sent at 06/02/2021  1:22 PM EDT ----- Cholesterol levels are abnormal, start Lipitor 40 mg daily.

## 2021-06-06 ENCOUNTER — Ambulatory Visit (INDEPENDENT_AMBULATORY_CARE_PROVIDER_SITE_OTHER): Payer: Medicare HMO

## 2021-06-06 ENCOUNTER — Other Ambulatory Visit: Payer: Self-pay | Admitting: *Deleted

## 2021-06-06 ENCOUNTER — Other Ambulatory Visit: Payer: Self-pay

## 2021-06-06 DIAGNOSIS — I4819 Other persistent atrial fibrillation: Secondary | ICD-10-CM | POA: Diagnosis not present

## 2021-06-06 DIAGNOSIS — E876 Hypokalemia: Secondary | ICD-10-CM

## 2021-06-06 MED ORDER — PERFLUTREN LIPID MICROSPHERE
1.0000 mL | INTRAVENOUS | Status: AC | PRN
Start: 2021-06-06 — End: 2021-06-06
  Administered 2021-06-06: 2 mL via INTRAVENOUS

## 2021-06-06 MED ORDER — POTASSIUM CHLORIDE CRYS ER 10 MEQ PO TBCR: 10.0000 meq | EXTENDED_RELEASE_TABLET | Freq: Two times a day (BID) | ORAL | 5 refills | Status: DC

## 2021-06-06 NOTE — Telephone Encounter (Signed)
Medication was sent to pharmacy.

## 2021-06-07 LAB — ECHOCARDIOGRAM COMPLETE
AR max vel: 2.12 cm2
AV Area VTI: 2.39 cm2
AV Area mean vel: 2.23 cm2
AV Mean grad: 2.3 mmHg
AV Peak grad: 5.3 mmHg
Ao pk vel: 1.15 m/s

## 2021-06-12 ENCOUNTER — Telehealth: Payer: Self-pay

## 2021-06-12 ENCOUNTER — Telehealth: Payer: Self-pay | Admitting: Cardiology

## 2021-06-12 NOTE — Telephone Encounter (Signed)
Cardiac Clearance faxed to Dr.Brain Agbor-Etang at this [phone].

## 2021-06-12 NOTE — Telephone Encounter (Signed)
   [facility] Pre-operative Risk Assessment    Patient Name: WHITTEN ANDREONI  DOB: September 04, 1950 MRN: [id number]  HEARTCARE STAFF:  - IMPORTANT!!!!!! Under Visit Info/Reason for Call, type in Other and utilize the format Clearance MM/DD/YY or Clearance TBD. Do not use dashes or single digits. - Please review there is not already an duplicate clearance open for this procedure. - If request is for dental extraction, please clarify the # of teeth to be extracted. - If the patient is currently at the dentist's office, call Pre-Op Callback Staff (MA/nurse) to input urgent request.  - If the patient is not currently in the dentist office, please route to the Pre-Op pool.  Request for surgical clearance:  What type of surgery is being performed? R Inguinal hernia   When is this surgery scheduled? TBD   What type of clearance is required (medical clearance vs. Pharmacy clearance to hold med vs. Both)? both  Are there any medications that need to be held prior to surgery and how long? Not listed, please advise if needed   Practice name and name of physician performing surgery? [facility] - Dr Christian Mate  What is the office phone number? [phone]   7.   What is the office fax number? [phone]  8.   Anesthesia type (None, local, MAC, general) ? Not listed    Ace Gins 06/12/2021, 3:17 PM  _________________________________________________________________   (provider comments below)

## 2021-06-13 NOTE — Telephone Encounter (Signed)
    Patient Name: Louis Luna  DOB: 1950/11/15 MRN: [id number]  Primary Cardiologist: Kate Sable, MD  Chart reviewed as part of pre-operative protocol coverage. Given past medical history and time since last visit, based on ACC/AHA guidelines, OBRIEN HUSKINS would be at acceptable risk for the planned procedure without further cardiovascular testing.   Patient was recently seen by Dr. Garen Lah who cleared him for surgery as long as echocardiogram does not showed significant abnormality. Echo obtained on 10/11 showed normal EF without significant valve issue.   Clinical pharmacist to review Xarelto holding time  The patient was advised that if he develops new symptoms prior to surgery to contact our office to arrange for a follow-up visit, and he verbalized understanding.  I will route this recommendation to the requesting party via Epic fax function and remove from pre-op pool.  Please call with questions.  [place], [place] 06/13/2021, 6:18 PM

## 2021-06-14 ENCOUNTER — Telehealth: Payer: Self-pay | Admitting: Surgery

## 2021-06-14 ENCOUNTER — Other Ambulatory Visit: Payer: Medicare HMO

## 2021-06-14 NOTE — Progress Notes (Unsigned)
Cardiology clearance has been received from Dr Thereasa Solo office. The patient is at acceptable risk for surgery. He may hold his Xarelto for 2 days prior to surgery.  All notes are in Epic.

## 2021-06-14 NOTE — Telephone Encounter (Signed)
Outgoing call is made, left message for patient to call.  Patient has been cleared for surgery by cardiology.  Calling to reschedule surgery.  Patient will also need follow up in office with Dr. Christian Mate prior to surgery.

## 2021-06-14 NOTE — Telephone Encounter (Signed)
   Primary Cardiologist: Kate Sable, MD  Chart reviewed as part of pre-operative protocol coverage. Given past medical history and time since last visit, based on ACC/AHA guidelines, JAVELLE DONIGAN would be at acceptable risk for the planned procedure without further cardiovascular testing.   Patient with diagnosis of A Fib on Xarelto for anticoagulation.     Procedure:  R Inguinal hernia  Date of procedure: TBD     CHA2DS2-VASc Score = 2  This indicates a 2.2% annual risk of stroke. The patient's score is based upon: CHF History: 0 HTN History: 1 Diabetes History: 0 Stroke History: 0 Vascular Disease History: 0 Age Score: 1 Gender Score: 0      CrCl 87 mL/min Platelet count 198K   Per office protocol, patient can hold Xarelto for 2 days prior to procedure.  I will route this recommendation to the requesting party via Epic fax function and remove from pre-op pool.  Please call with questions.  Jossie Ng. Candy Leverett NP-C    06/14/2021, 8:45 AM [address] Office [phone] Fax [phone]

## 2021-06-14 NOTE — Telephone Encounter (Signed)
Patient with diagnosis of A Fib on Xarelto for anticoagulation.    Procedure:  R Inguinal hernia  Date of procedure: TBD   CHA2DS2-VASc Score = 2  This indicates a 2.2% annual risk of stroke. The patient's score is based upon: CHF History: 0 HTN History: 1 Diabetes History: 0 Stroke History: 0 Vascular Disease History: 0 Age Score: 1 Gender Score: 0    CrCl 87 mL/min Platelet count 198K  Per office protocol, patient can hold Xarelto for 2 days prior to procedure.

## 2021-06-15 ENCOUNTER — Telehealth: Payer: Self-pay | Admitting: Surgery

## 2021-06-15 NOTE — Telephone Encounter (Signed)
Patient has been advised of Pre-Admission date/time, COVID Testing date and Surgery date.  Surgery Date: 07/05/21 Preadmission Testing Date: 06/29/21 (phone 8a-1p) Covid Testing Date: Not needed.    Patient has been made aware to call [phone], between 1-3:00pm the day before surgery, to find out what time to arrive for surgery.    Patient also reminded to hold his Xarelto 2 days prior to surgery.  Patient verbalized understanding.

## 2021-06-28 ENCOUNTER — Encounter: Payer: Self-pay | Admitting: Family Medicine

## 2021-06-29 ENCOUNTER — Telehealth: Payer: Self-pay | Admitting: Surgery

## 2021-06-29 ENCOUNTER — Ambulatory Visit: Payer: Medicare HMO | Admitting: Surgery

## 2021-06-29 ENCOUNTER — Other Ambulatory Visit: Admission: RE | Admit: 2021-06-29 | Payer: Medicare HMO | Source: Ambulatory Visit

## 2021-06-29 NOTE — Telephone Encounter (Signed)
Patient walks in this morning and requested to cancel his surgery for 07/05/21.  States that he wants to focus on getting better with his afib and also said honestly he is feeling better with the hernia. Just does not want to do surgery at this time. If he changes his mind will call is back.  He is wished well.

## 2021-06-29 NOTE — Telephone Encounter (Signed)
Patient came by the office to let you know that he is not going to have the surgery he was suppose to have.   Said his "hernia" is gone and having no pain.

## 2021-07-05 ENCOUNTER — Encounter: Admission: RE | Payer: Self-pay | Source: Home / Self Care

## 2021-07-05 ENCOUNTER — Ambulatory Visit: Admission: RE | Admit: 2021-07-05 | Payer: Medicare HMO | Source: Home / Self Care | Admitting: Surgery

## 2021-07-05 SURGERY — HERNIORRHAPHY, INGUINAL, ROBOT-ASSISTED, LAPAROSCOPIC
Anesthesia: General | Laterality: Right

## 2021-07-18 ENCOUNTER — Other Ambulatory Visit: Payer: Self-pay | Admitting: Family Medicine

## 2021-07-21 ENCOUNTER — Other Ambulatory Visit: Payer: Self-pay | Admitting: Family Medicine

## 2021-07-21 DIAGNOSIS — F32A Depression, unspecified: Secondary | ICD-10-CM

## 2021-08-11 ENCOUNTER — Ambulatory Visit: Payer: Self-pay

## 2021-08-11 NOTE — Telephone Encounter (Signed)
Chief Complaint: COVID positive  Symptoms: slight congestion Frequency: over a week  Pertinent Negatives: Patient denies fever or difficulty  Disposition: _0 ED /_1 Urgent Care (no appt availability in office) / _2 Appointment(In office/virtual)/ _3  [facility]/ _4 Home Care/ _5 Refused Recommended Disposition  Additional Notes: Pt states he has been taking Mucinex and helps a lot. Advised him if he was to not get better or symptoms worsen can do virtual care visit or call back Monday and let us know since his wife is positive as well. Pt was grateful for the call and says he thinks hell be ok but will let us know if he needs [place]. Care advice given and pt verbalized understanding. No other questions/concerns noted.     Reason for Disposition  [1] COVID-19 diagnosed by positive lab test (e.g., PCR, rapid self-test kit) AND [2] mild symptoms (e.g., cough, fever, others) AND [2] no complications or SOB  Answer Assessment - Initial Assessment Questions 1. COVID-19 DIAGNOSIS: "Who made your COVID-19 diagnosis?" "Was it confirmed by a positive lab test or self-test?" If not diagnosed by a doctor (or NP/PA), ask "Are there lots of cases (community spread) where you live?" Note: See public health department website, if unsure.     Home test today 2. COVID-19 EXPOSURE: "Was there any known exposure to COVID before the symptoms began?" CDC Definition of close contact: within 6 feet (2 meters) for a total of 15 minutes or more over a 24-hour period.      No 3. ONSET: "When did the COVID-19 symptoms start?"      Over a week  4. WORST SYMPTOM: "What is your worst symptom?" (e.g., cough, fever, shortness of breath, muscle aches)     NA 5. COUGH: "Do you have a cough?" If Yes, ask: "How bad is the cough?"       NO 6. FEVER: "Do you have a fever?" If Yes, ask: "What is your temperature, how was it measured, and when did it start?"     NO 7. RESPIRATORY STATUS: "Describe your breathing?" (e.g.,  shortness of breath, wheezing, unable to speak)      No 10. VACCINE: "Have you had the COVID-19 vaccine?" If Yes, ask: "Which one, how many shots, when did you get it?"       yes 11. BOOSTER: "Have you received your COVID-19 booster?" If Yes, ask: "Which one and when did you get it?"       yes 13. OTHER SYMPTOMS: "Do you have any other symptoms?"  (e.g., chills, fatigue, headache, loss of smell or taste, muscle pain, sore throat) congestion  Protocols used: Coronavirus (COVID-19) Diagnosed or Suspected-A-AH

## 2021-08-30 ENCOUNTER — Other Ambulatory Visit: Payer: Self-pay | Admitting: Family Medicine

## 2021-08-30 DIAGNOSIS — I10 Essential (primary) hypertension: Secondary | ICD-10-CM

## 2021-09-04 ENCOUNTER — Other Ambulatory Visit: Payer: Self-pay

## 2021-09-04 ENCOUNTER — Encounter: Payer: Self-pay | Admitting: Cardiology

## 2021-09-04 ENCOUNTER — Ambulatory Visit: Payer: Medicare HMO | Admitting: Cardiology

## 2021-09-04 VITALS — BP 132/102 | HR 74 | Ht 69.0 in | Wt 221.0 lb

## 2021-09-04 DIAGNOSIS — I4819 Other persistent atrial fibrillation: Secondary | ICD-10-CM | POA: Diagnosis not present

## 2021-09-04 DIAGNOSIS — I10 Essential (primary) hypertension: Secondary | ICD-10-CM

## 2021-09-04 DIAGNOSIS — E7800 Pure hypercholesterolemia, unspecified: Secondary | ICD-10-CM

## 2021-09-04 NOTE — H&P (View-Only) (Signed)
Cardiology Office Note:    Date:  09/04/2021   ID:  Louis, Luna 1951/01/03, MRN [id number]  PCP:  Jerrol Banana., MD   [facility] Providers Cardiologist:  Kate Sable, MD     Referring MD: Jerrol Banana.,*   Chief Complaint  Patient presents with   Other    3 month follow up -- Meds reviewed verbally with patient.     History of Present Illness:    Louis Luna is a 71 y.o. male with a hx of atrial fibrillation, hypertension, hyperlipidemia, former smoker x20+ years who presents for follow-up.  He was previously seen due to new onset A. fib and preop evaluation.  Echo obtained with no gross structural abnormalities, EF preserved.  Denies palpitations.  He states eating healthier, his inguinal hernia is much improved.  He did not undergo surgical repair as initially planned.  He feels well, takes medications including Xarelto as prescribed, has not missed a dose.  Cholesterol was previously elevated, patient states eating healthier.  Prior notes Echo 05/2021 EF 50 to 55%.  Mildly dilated LA.  Past Medical History:  Diagnosis Date   Depression    GERD (gastroesophageal reflux disease)    H/O elevated lipids    HLD (hyperlipidemia)    Hypertension    Personal history of tobacco use, presenting hazards to health 01/26/2016   Sleep apnea     Past Surgical History:  Procedure Laterality Date   ANKLE SURGERY     APPENDECTOMY     COLONOSCOPY N/A 03/23/2016   Procedure: COLONOSCOPY;  Surgeon: Manya Silvas, MD;  Location: [place];  Service: Endoscopy;  Laterality: N/A;   HERNIA REPAIR     inguinal-left 04/1979   QUADRICEPS TENDON REPAIR Left 05/05/2015   Procedure: REPAIR QUADRICEP TENDON;  Surgeon: Hessie Knows, MD;  Location: [facility];  Service: Orthopedics;  Laterality: Left;   TUMOR EXCISION     from Rt side of the neck-benign    Current Medications: Current Meds  Medication Sig   amLODipine (NORVASC) 5 MG tablet TAKE  1 TABLET(5 MG) BY MOUTH DAILY   atenolol (TENORMIN) 50 MG tablet TAKE 1 TABLET BY MOUTH EVERY DAY   atorvastatin (LIPITOR) 40 MG tablet Take 1 tablet (40 mg total) by mouth daily.   chlorthalidone (HYGROTON) 25 MG tablet TAKE 1 TABLET(25 MG) BY MOUTH EVERY MORNING   docusate sodium (COLACE) 100 MG capsule Take 200 mg by mouth 2 (two) times daily.   Doxylamine Succinate, Sleep, (SLEEP AID PO) Take 2 tablets by mouth at bedtime.   Misc Natural Products (PROSTATE HEALTH PO) Take 250 mg by mouth in the morning and at bedtime. Super Beta Prostate     Allergies:   Patient has no known allergies.   Social History   Socioeconomic History   Marital status: Married    Spouse name: [name]   Number of children: 2   Years of education: 18   Highest education level: Not on file  Occupational History   Occupation: retired  Tobacco Use   Smoking status: Former    Packs/day: 1.00    Years: 30.00    Pack years: 30.00    Types: Cigarettes    Quit date: 10/26/2011    Years since quitting: 9.8   Smokeless tobacco: Never  Vaping Use   Vaping Use: Never used  Substance and Sexual Activity   Alcohol use: Yes    Comment: ocassionally-beer   Drug use: No  Sexual activity: Never    Birth control/protection: None  Other Topics Concern   Not on file  Social History Narrative   Not on file   Social Determinants of Health   Financial Resource Strain: Not on file  Food Insecurity: Not on file  Transportation Needs: Not on file  Physical Activity: Not on file  Stress: Not on file  Social Connections: Not on file     Family History: The patient's family history includes Alcohol abuse in his brother and father; Alzheimer's disease in his father; Breast cancer in his mother, sister, and sister; Depression in his sister and sister; Diabetes in his mother; Hypertension in his brother, brother, father, and mother; Melanoma in his sister.  ROS:   Please see the history of present illness.     All  other systems reviewed and are negative.  EKGs/Labs/Other Studies Reviewed:    The following studies were reviewed today:   EKG:  EKG is  ordered today.  The ekg ordered today demonstrates atrial fibrillation   Recent Labs: 05/26/2021: ALT 21; BUN 16; Creatinine, Ser 0.89; Hemoglobin 16.1; Platelets 198; Potassium 3.0; Sodium 138  Recent Lipid Panel    Component Value Date/Time   CHOL 233 (H) 06/01/2021 0806   TRIG 153 (H) 06/01/2021 0806   HDL 39 (L) 06/01/2021 0806   CHOLHDL 6.0 (H) 06/01/2021 0806   LDLCALC 166 (H) 06/01/2021 0806     Risk Assessment/Calculations:          Physical Exam:    VS:  BP (!) 132/102 (BP Location: Left Arm, Patient Position: Sitting, Cuff Size: Normal)    Pulse 74    Ht 5\' 9"  (1.753 m)    Wt 221 lb (100.2 kg)    SpO2 96%    BMI 32.64 kg/m     Wt Readings from Last 3 Encounters:  09/04/21 221 lb (100.2 kg)  05/30/21 212 lb (96.2 kg)  05/11/21 209 lb (94.8 kg)     GEN:  Well nourished, well developed in no acute distress HEENT: Normal NECK: No JVD; No carotid bruits LYMPHATICS: No lymphadenopathy CARDIAC: Irregular irregular RESPIRATORY:  Clear to auscultation without rales, wheezing or rhonchi  ABDOMEN: Soft, non-tender, non-distended MUSCULOSKELETAL:  No edema; No deformity  SKIN: Warm and dry NEUROLOGIC:  Alert and oriented x 3 PSYCHIATRIC:  Normal affect   ASSESSMENT:    1. Persistent atrial fibrillation ([place])   2. Primary hypertension   3. Pure hypercholesterolemia     PLAN:    In order of problems listed above:  Persistent A. fib, CHA2DS2-VASc score 2 (htn, age).  Heart rate controlled, continue atenolol 50 mg daily, Xarelto 20 mg daily.  Echo with preserved EF.  Plan DC cardioversion in 1 week, has not missed a dose of Xarelto for over 4 weeks now.  Schedule patient with EP/A. fib clinic. Hypertension  Continue chlorthalidone, amlodipine, atenolol. Hyperlipidemia, obtain fasting lipid profile today.  If remains abnormal,  plan to start statin.  Follow-up in 1 month.      Shared Decision Making/Informed Consent The risks (stroke, cardiac arrhythmias rarely resulting in the need for a temporary or permanent pacemaker, skin irritation or burns and complications associated with conscious sedation including aspiration, arrhythmia, respiratory failure and death), benefits (restoration of normal sinus rhythm) and alternatives of a direct current cardioversion were explained in detail to Mr. Ileene Patrick and he agrees to proceed.     Medication Adjustments/Labs and Tests Ordered: Current medicines are reviewed at length with the patient  today.  Concerns regarding medicines are outlined above.  Orders Placed This Encounter  Procedures   CBC   Lipid panel   Basic metabolic panel   EKG 01-BPZW    No orders of the defined types were placed in this encounter.    Patient Instructions  Medication Instructions:  Your physician recommends that you continue on your current medications as directed. Please refer to the Current Medication list given to you today.  *If you need a refill on your cardiac medications before your next appointment, please call your pharmacy*   Lab Work:  CBC, BMP, Lipid to be drawn in office today.   Testing/Procedures:  You are scheduled for a Cardioversion on ___Monday 1/16/23_____________ with Dr._Agbor-Etang__________ Please arrive at the [place] [facility] at __0630_______ a.m. on the day of your procedure.  DIET INSTRUCTIONS:  Nothing to eat or drink after midnight except your medications with a sip of water.         Labs: __Drawn in office________________  Medications:  YOU MAY TAKE ALL of your remaining medications with a small amount of water.  Must have a responsible person to drive you home.  Bring a current list of your medications and current insurance cards.    If you have any questions after you get home, please call the office at [phone]    Follow-Up: At  [facility], you and your health needs are our priority.  As part of our continuing mission to provide you with exceptional heart care, we have created designated Provider Care Teams.  These Care Teams include your primary Cardiologist (physician) and Advanced Practice Providers (APPs -  Physician Assistants and Nurse Practitioners) who all work together to provide you with the care you need, when you need it.  We recommend signing up for the patient portal called "MyChart".  Sign up information is provided on this After Visit Summary.  MyChart is used to connect with patients for Virtual Visits (Telemedicine).  Patients are able to view lab/test results, encounter notes, upcoming appointments, etc.  Non-urgent messages can be sent to your provider as well.   To learn more about what you can do with MyChart, go to [email or web address].    Your next appointment:   3 week(s)  The format for your next appointment:   In Person  Provider:   Kate Sable, MD    Other Instructions     Signed, Kate Sable, MD  09/04/2021 12:38 PM    [place]

## 2021-09-04 NOTE — Addendum Note (Signed)
Addended by: Kavin Leech on: 09/04/2021 01:59 PM   Modules accepted: Orders

## 2021-09-04 NOTE — Patient Instructions (Signed)
Medication Instructions:  Your physician recommends that you continue on your current medications as directed. Please refer to the Current Medication list given to you today.  *If you need a refill on your cardiac medications before your next appointment, please call your pharmacy*   Lab Work:  CBC, BMP, Lipid to be drawn in office today.   Testing/Procedures:  You are scheduled for a Cardioversion on ___Monday 1/16/23_____________ with Dr._Agbor-Etang__________ Please arrive at the [place] [facility] at __0630_______ a.m. on the day of your procedure.  DIET INSTRUCTIONS:  Nothing to eat or drink after midnight except your medications with a sip of water.         Labs: __Drawn in office________________  Medications:  YOU MAY TAKE ALL of your remaining medications with a small amount of water.  Must have a responsible person to drive you home.  Bring a current list of your medications and current insurance cards.    If you have any questions after you get home, please call the office at [phone]    Follow-Up: At [facility], you and your health needs are our priority.  As part of our continuing mission to provide you with exceptional heart care, we have created designated Provider Care Teams.  These Care Teams include your primary Cardiologist (physician) and Advanced Practice Providers (APPs -  Physician Assistants and Nurse Practitioners) who all work together to provide you with the care you need, when you need it.  We recommend signing up for the patient portal called "MyChart".  Sign up information is provided on this After Visit Summary.  MyChart is used to connect with patients for Virtual Visits (Telemedicine).  Patients are able to view lab/test results, encounter notes, upcoming appointments, etc.  Non-urgent messages can be sent to your provider as well.   To learn more about what you can do with MyChart, go to [email or web address].    Your next appointment:    3 week(s)  The format for your next appointment:   In Person  Provider:   Kate Sable, MD    Other Instructions

## 2021-09-04 NOTE — Progress Notes (Signed)
Cardiology Office Note:    Date:  09/04/2021   ID:  Louis Luna, Louis Luna May 15, 1951, MRN [id number]  PCP:  Jerrol Banana., MD   [facility] Providers Cardiologist:  Kate Sable, MD     Referring MD: Jerrol Banana.,*   Chief Complaint  Patient presents with   Other    3 month follow up -- Meds reviewed verbally with patient.     History of Present Illness:    Louis Luna is a 71 y.o. male with a hx of atrial fibrillation, hypertension, hyperlipidemia, former smoker x20+ years who presents for follow-up.  He was previously seen due to new onset A. fib and preop evaluation.  Echo obtained with no gross structural abnormalities, EF preserved.  Denies palpitations.  He states eating healthier, his inguinal hernia is much improved.  He did not undergo surgical repair as initially planned.  He feels well, takes medications including Xarelto as prescribed, has not missed a dose.  Cholesterol was previously elevated, patient states eating healthier.  Prior notes Echo 05/2021 EF 50 to 55%.  Mildly dilated LA.  Past Medical History:  Diagnosis Date   Depression    GERD (gastroesophageal reflux disease)    H/O elevated lipids    HLD (hyperlipidemia)    Hypertension    Personal history of tobacco use, presenting hazards to health 01/26/2016   Sleep apnea     Past Surgical History:  Procedure Laterality Date   ANKLE SURGERY     APPENDECTOMY     COLONOSCOPY N/A 03/23/2016   Procedure: COLONOSCOPY;  Surgeon: Manya Silvas, MD;  Location: [place];  Service: Endoscopy;  Laterality: N/A;   HERNIA REPAIR     inguinal-left 04/1979   QUADRICEPS TENDON REPAIR Left 05/05/2015   Procedure: REPAIR QUADRICEP TENDON;  Surgeon: Hessie Knows, MD;  Location: [facility];  Service: Orthopedics;  Laterality: Left;   TUMOR EXCISION     from Rt side of the neck-benign    Current Medications: Current Meds  Medication Sig   amLODipine (NORVASC) 5 MG tablet TAKE  1 TABLET(5 MG) BY MOUTH DAILY   atenolol (TENORMIN) 50 MG tablet TAKE 1 TABLET BY MOUTH EVERY DAY   atorvastatin (LIPITOR) 40 MG tablet Take 1 tablet (40 mg total) by mouth daily.   chlorthalidone (HYGROTON) 25 MG tablet TAKE 1 TABLET(25 MG) BY MOUTH EVERY MORNING   docusate sodium (COLACE) 100 MG capsule Take 200 mg by mouth 2 (two) times daily.   Doxylamine Succinate, Sleep, (SLEEP AID PO) Take 2 tablets by mouth at bedtime.   Misc Natural Products (PROSTATE HEALTH PO) Take 250 mg by mouth in the morning and at bedtime. Super Beta Prostate     Allergies:   Patient has no known allergies.   Social History   Socioeconomic History   Marital status: Married    Spouse name: [name]   Number of children: 2   Years of education: 18   Highest education level: Not on file  Occupational History   Occupation: retired  Tobacco Use   Smoking status: Former    Packs/day: 1.00    Years: 30.00    Pack years: 30.00    Types: Cigarettes    Quit date: 10/26/2011    Years since quitting: 9.8   Smokeless tobacco: Never  Vaping Use   Vaping Use: Never used  Substance and Sexual Activity   Alcohol use: Yes    Comment: ocassionally-beer   Drug use: No  Sexual activity: Never    Birth control/protection: None  Other Topics Concern   Not on file  Social History Narrative   Not on file   Social Determinants of Health   Financial Resource Strain: Not on file  Food Insecurity: Not on file  Transportation Needs: Not on file  Physical Activity: Not on file  Stress: Not on file  Social Connections: Not on file     Family History: The patient's family history includes Alcohol abuse in his brother and father; Alzheimer's disease in his father; Breast cancer in his mother, sister, and sister; Depression in his sister and sister; Diabetes in his mother; Hypertension in his brother, brother, father, and mother; Melanoma in his sister.  ROS:   Please see the history of present illness.     All  other systems reviewed and are negative.  EKGs/Labs/Other Studies Reviewed:    The following studies were reviewed today:   EKG:  EKG is  ordered today.  The ekg ordered today demonstrates atrial fibrillation   Recent Labs: 05/26/2021: ALT 21; BUN 16; Creatinine, Ser 0.89; Hemoglobin 16.1; Platelets 198; Potassium 3.0; Sodium 138  Recent Lipid Panel    Component Value Date/Time   CHOL 233 (H) 06/01/2021 0806   TRIG 153 (H) 06/01/2021 0806   HDL 39 (L) 06/01/2021 0806   CHOLHDL 6.0 (H) 06/01/2021 0806   LDLCALC 166 (H) 06/01/2021 0806     Risk Assessment/Calculations:          Physical Exam:    VS:  BP (!) 132/102 (BP Location: Left Arm, Patient Position: Sitting, Cuff Size: Normal)    Pulse 74    Ht 5\' 9"  (1.753 m)    Wt 221 lb (100.2 kg)    SpO2 96%    BMI 32.64 kg/m     Wt Readings from Last 3 Encounters:  09/04/21 221 lb (100.2 kg)  05/30/21 212 lb (96.2 kg)  05/11/21 209 lb (94.8 kg)     GEN:  Well nourished, well developed in no acute distress HEENT: Normal NECK: No JVD; No carotid bruits LYMPHATICS: No lymphadenopathy CARDIAC: Irregular irregular RESPIRATORY:  Clear to auscultation without rales, wheezing or rhonchi  ABDOMEN: Soft, non-tender, non-distended MUSCULOSKELETAL:  No edema; No deformity  SKIN: Warm and dry NEUROLOGIC:  Alert and oriented x 3 PSYCHIATRIC:  Normal affect   ASSESSMENT:    1. Persistent atrial fibrillation ([place])   2. Primary hypertension   3. Pure hypercholesterolemia     PLAN:    In order of problems listed above:  Persistent A. fib, CHA2DS2-VASc score 2 (htn, age).  Heart rate controlled, continue atenolol 50 mg daily, Xarelto 20 mg daily.  Echo with preserved EF.  Plan DC cardioversion in 1 week, has not missed a dose of Xarelto for over 4 weeks now.  Schedule patient with EP/A. fib clinic. Hypertension  Continue chlorthalidone, amlodipine, atenolol. Hyperlipidemia, obtain fasting lipid profile today.  If remains abnormal,  plan to start statin.  Follow-up in 1 month.      Shared Decision Making/Informed Consent The risks (stroke, cardiac arrhythmias rarely resulting in the need for a temporary or permanent pacemaker, skin irritation or burns and complications associated with conscious sedation including aspiration, arrhythmia, respiratory failure and death), benefits (restoration of normal sinus rhythm) and alternatives of a direct current cardioversion were explained in detail to Mr. Ileene Patrick and he agrees to proceed.     Medication Adjustments/Labs and Tests Ordered: Current medicines are reviewed at length with the patient  today.  Concerns regarding medicines are outlined above.  Orders Placed This Encounter  Procedures   CBC   Lipid panel   Basic metabolic panel   EKG 82-NOIB    No orders of the defined types were placed in this encounter.    Patient Instructions  Medication Instructions:  Your physician recommends that you continue on your current medications as directed. Please refer to the Current Medication list given to you today.  *If you need a refill on your cardiac medications before your next appointment, please call your pharmacy*   Lab Work:  CBC, BMP, Lipid to be drawn in office today.   Testing/Procedures:  You are scheduled for a Cardioversion on ___Monday 1/16/23_____________ with Dr._Agbor-Etang__________ Please arrive at the [place] [facility] at __0630_______ a.m. on the day of your procedure.  DIET INSTRUCTIONS:  Nothing to eat or drink after midnight except your medications with a sip of water.         Labs: __Drawn in office________________  Medications:  YOU MAY TAKE ALL of your remaining medications with a small amount of water.  Must have a responsible person to drive you home.  Bring a current list of your medications and current insurance cards.    If you have any questions after you get home, please call the office at [phone]    Follow-Up: At  [facility], you and your health needs are our priority.  As part of our continuing mission to provide you with exceptional heart care, we have created designated Provider Care Teams.  These Care Teams include your primary Cardiologist (physician) and Advanced Practice Providers (APPs -  Physician Assistants and Nurse Practitioners) who all work together to provide you with the care you need, when you need it.  We recommend signing up for the patient portal called "MyChart".  Sign up information is provided on this After Visit Summary.  MyChart is used to connect with patients for Virtual Visits (Telemedicine).  Patients are able to view lab/test results, encounter notes, upcoming appointments, etc.  Non-urgent messages can be sent to your provider as well.   To learn more about what you can do with MyChart, go to [email or web address].    Your next appointment:   3 week(s)  The format for your next appointment:   In Person  Provider:   Kate Sable, MD    Other Instructions     Signed, Kate Sable, MD  09/04/2021 12:38 PM    [place]

## 2021-09-05 ENCOUNTER — Other Ambulatory Visit: Payer: Self-pay | Admitting: *Deleted

## 2021-09-05 DIAGNOSIS — I4819 Other persistent atrial fibrillation: Secondary | ICD-10-CM

## 2021-09-05 LAB — BASIC METABOLIC PANEL
BUN/Creatinine Ratio: 19 (ref 10–24)
BUN: 18 mg/dL (ref 8–27)
CO2: 27 mmol/L (ref 20–29)
Calcium: 9.5 mg/dL (ref 8.6–10.2)
Chloride: 101 mmol/L (ref 96–106)
Creatinine, Ser: 0.94 mg/dL (ref 0.76–1.27)
Glucose: 107 mg/dL — ABNORMAL HIGH (ref 70–99)
Potassium: 3.3 mmol/L — ABNORMAL LOW (ref 3.5–5.2)
Sodium: 141 mmol/L (ref 134–144)
eGFR: 87 mL/min/{1.73_m2} (ref >59)

## 2021-09-05 LAB — CBC
Hematocrit: 45.5 % (ref 37.5–51.0)
Hemoglobin: 15.8 g/dL (ref 13.0–17.7)
MCH: 31.5 pg (ref 26.6–33.0)
MCHC: 34.7 g/dL (ref 31.5–35.7)
MCV: 91 fL (ref 79–97)
Platelets: 167 10*3/uL (ref 150–450)
RBC: 5.01 x10E6/uL (ref 4.14–5.80)
RDW: 14.5 % (ref 11.6–15.4)
WBC: 6.3 10*3/uL (ref 3.4–10.8)

## 2021-09-05 LAB — LIPID PANEL
Chol/HDL Ratio: 3.6 ratio (ref 0.0–5.0)
Cholesterol, Total: 142 mg/dL (ref 100–199)
HDL: 39 mg/dL — ABNORMAL LOW (ref >39)
LDL Chol Calc (NIH): 77 mg/dL (ref 0–99)
Triglycerides: 150 mg/dL — ABNORMAL HIGH (ref 0–149)
VLDL Cholesterol Cal: 26 mg/dL (ref 5–40)

## 2021-09-11 ENCOUNTER — Ambulatory Visit: Payer: Medicare HMO | Admitting: Anesthesiology

## 2021-09-11 ENCOUNTER — Ambulatory Visit
Admission: RE | Admit: 2021-09-11 | Discharge: 2021-09-11 | Disposition: A | Discharge status: Home / Self Care | Payer: Medicare HMO | Attending: Cardiology | Admitting: Cardiology

## 2021-09-11 ENCOUNTER — Encounter: Payer: Self-pay | Admitting: Cardiology

## 2021-09-11 ENCOUNTER — Encounter
Admission: RE | Disposition: A | Discharge status: Home / Self Care | Payer: Self-pay | Source: Home / Self Care | Attending: Cardiology

## 2021-09-11 DIAGNOSIS — Z79899 Other long term (current) drug therapy: Secondary | ICD-10-CM | POA: Diagnosis not present

## 2021-09-11 DIAGNOSIS — Z7901 Long term (current) use of anticoagulants: Secondary | ICD-10-CM | POA: Diagnosis not present

## 2021-09-11 DIAGNOSIS — E7800 Pure hypercholesterolemia, unspecified: Secondary | ICD-10-CM | POA: Diagnosis not present

## 2021-09-11 DIAGNOSIS — I10 Essential (primary) hypertension: Secondary | ICD-10-CM | POA: Insufficient documentation

## 2021-09-11 DIAGNOSIS — I4891 Unspecified atrial fibrillation: Secondary | ICD-10-CM | POA: Diagnosis not present

## 2021-09-11 DIAGNOSIS — Z87891 Personal history of nicotine dependence: Secondary | ICD-10-CM | POA: Insufficient documentation

## 2021-09-11 DIAGNOSIS — I4819 Other persistent atrial fibrillation: Secondary | ICD-10-CM

## 2021-09-11 DIAGNOSIS — K219 Gastro-esophageal reflux disease without esophagitis: Secondary | ICD-10-CM | POA: Diagnosis not present

## 2021-09-11 HISTORY — PX: CARDIOVERSION: SHX1299

## 2021-09-11 SURGERY — CARDIOVERSION
Anesthesia: General

## 2021-09-11 MED ORDER — DIPHENHYDRAMINE HCL 50 MG/ML IJ SOLN: 50.0000 mg | Freq: Once | INTRAMUSCULAR | Status: AC

## 2021-09-11 MED ORDER — METHYLPREDNISOLONE SODIUM SUCC 125 MG IJ SOLR: 125.0000 mg | Freq: Once | INTRAMUSCULAR | Status: AC

## 2021-09-11 MED ORDER — PROPOFOL 10 MG/ML IV BOLUS
INTRAVENOUS | Status: DC | PRN
Start: 2021-09-11 — End: 2021-09-11
  Administered 2021-09-11: 20 mg via INTRAVENOUS
  Administered 2021-09-11: 50 mg via INTRAVENOUS

## 2021-09-11 MED ORDER — SODIUM CHLORIDE 0.9 % IV SOLN
INTRAVENOUS | Status: DC
  Administered 2021-09-11: 1000 mL via INTRAVENOUS

## 2021-09-11 MED ORDER — DIPHENHYDRAMINE HCL 50 MG/ML IJ SOLN
INTRAMUSCULAR | Status: AC
  Administered 2021-09-11: 50 mg via INTRAVENOUS
  Filled 2021-09-11: qty 1

## 2021-09-11 MED ORDER — ONDANSETRON HCL 4 MG/2ML IJ SOLN: 4.0000 mg | Freq: Once | INTRAMUSCULAR | Status: DC | PRN

## 2021-09-11 MED ORDER — PROPOFOL 10 MG/ML IV BOLUS
INTRAVENOUS | Status: AC
  Filled 2021-09-11: qty 20

## 2021-09-11 MED ORDER — METHYLPREDNISOLONE SODIUM SUCC 125 MG IJ SOLR
INTRAMUSCULAR | Status: AC
  Administered 2021-09-11: 125 mg via INTRAVENOUS
  Filled 2021-09-11: qty 2

## 2021-09-11 MED ORDER — FENTANYL CITRATE (PF) 100 MCG/2ML IJ SOLN: 25.0000 ug | INTRAMUSCULAR | Status: DC | PRN

## 2021-09-11 NOTE — Anesthesia Preprocedure Evaluation (Signed)
Anesthesia Evaluation  Patient identified by MRN, date of birth, ID band Patient awake    Reviewed: Allergy & Precautions, H&P , NPO status , Patient's Chart, lab work & pertinent test results, reviewed documented beta blocker date and time   Airway Mallampati: II   Neck ROM: full    Dental  (+) Poor Dentition   Pulmonary sleep apnea , former smoker,    Pulmonary exam normal        Cardiovascular Exercise Tolerance: Poor hypertension, On Medications + CAD  Atrial Fibrillation  Rhythm:regular Rate:Normal     Neuro/Psych PSYCHIATRIC DISORDERS Depression  Neuromuscular disease    GI/Hepatic Neg liver ROS, GERD  Medicated,  Endo/Other  negative endocrine ROS  Renal/GU negative Renal ROS  negative genitourinary   Musculoskeletal   Abdominal   Peds  Hematology negative hematology ROS (+)   Anesthesia Other Findings Past Medical History: No date: Depression No date: GERD (gastroesophageal reflux disease) No date: H/O elevated lipids No date: HLD (hyperlipidemia) No date: Hypertension 01/26/2016: Personal history of tobacco use, presenting hazards to  health No date: Sleep apnea Past Surgical History: No date: ANKLE SURGERY No date: APPENDECTOMY 03/23/2016: COLONOSCOPY; N/A     Comment:  Procedure: COLONOSCOPY;  Surgeon: Manya Silvas, MD;               Location: [facility] ENDOSCOPY;  Service: Endoscopy;                Laterality: N/A; No date: HERNIA REPAIR     Comment:  inguinal-left 04/1979 05/05/2015: QUADRICEPS TENDON REPAIR; Left     Comment:  Procedure: REPAIR QUADRICEP TENDON;  Surgeon: Hessie Knows, MD;  Location: [facility] ORS;  Service: Orthopedics;                Laterality: Left; No date: TUMOR EXCISION     Comment:  from Rt side of the neck-benign   Reproductive/Obstetrics negative OB ROS                             Anesthesia Physical Anesthesia Plan  ASA:  4  Anesthesia Plan: General   Post-op Pain Management:    Induction:   PONV Risk Score and Plan:   Airway Management Planned:   Additional Equipment:   Intra-op Plan:   Post-operative Plan:   Informed Consent: I have reviewed the patients History and Physical, chart, labs and discussed the procedure including the risks, benefits and alternatives for the proposed anesthesia with the patient or authorized representative who has indicated his/her understanding and acceptance.     Dental Advisory Given  Plan Discussed with: CRNA  Anesthesia Plan Comments:         Anesthesia Quick Evaluation

## 2021-09-11 NOTE — Progress Notes (Signed)
0930 - MD called to check status of patient. No change. Patient is not getting any worse but not improving. Patient asked to remove electrodes and BP cuff. Seems to feel better without them. Will continue to monitor.

## 2021-09-11 NOTE — Interval H&P Note (Signed)
History and Physical Interval Note:  09/11/2021 7:42 AM  Louis Luna  has presented today for surgery, with the diagnosis of   Afib.  The various methods of treatment have been discussed with the patient and family. After consideration of risks, benefits and other options for treatment, the patient has consented to  Procedure(s): CARDIOVERSION (N/A) as a surgical intervention.  The patient's history has been reviewed, patient examined, no change in status, stable for surgery.  I have reviewed the patient's chart and labs.  Questions were answered to the patient's satisfaction.     Aaron Edelman [place]

## 2021-09-11 NOTE — Progress Notes (Signed)
1015 - Patient states some improvement and would like to go home. MD notified and OK to discharge to home with instructions to go to the ER for any worsening symptoms.

## 2021-09-11 NOTE — Progress Notes (Signed)
0830 - Patient complaining of "bee stings" sensation in groin area and spreading up to chest and arm pits. States it also goes down legs and lower back. MD called. He is here to see patient. Orders given for IV benadryl and methylprednisolone. Will continue to monitor.

## 2021-09-11 NOTE — Procedures (Addendum)
Cardioversion procedure note For atrial fibrillation.  Procedure Details:  Consent: Risks of procedure as well as the alternatives and risks of each were explained to the (patient/caregiver).  Consent for procedure obtained.  Time Out: Verified patient identification, verified procedure, site/side was marked, verified correct patient position, special equipment/implants available, medications/allergies/relevent history reviewed, required imaging and test results available.  Performed  Patient placed on cardiac monitor, pulse oximetry, supplemental oxygen as necessary.   Sedation given: propofol IV per anesthesia team.  Pacer pads placed anterior and posterior chest.   Cardioverted 1 time(s).   Cardioverted at  [place]. Synchronized biphasic Converted to NSR   Evaluation: Findings: Post procedure EKG shows: NSR Complications: None Patient did tolerate procedure well.  Time Spent Directly with the Patient:  25 minutes   Kate Sable, M.D.   Addendum States having feeling of bee stings all over his body, approximately 30 minutes after procedure.  Unsure if this is a hypersensitivity reaction to propofol.  Will manage as such.  He is otherwise hemodynamically stable, normal O2 sats.  Given IV Solu-Medrol 125x1, IV Benadryl 50 mg x 1.  Continue to monitor closely.

## 2021-09-11 NOTE — Transfer of Care (Signed)
Immediate Anesthesia Transfer of Care Note  Patient: Louis Luna  Procedure(s) Performed: CARDIOVERSION  Patient Location: spu  Anesthesia Type:General  Level of Consciousness: awake  Airway & Oxygen Therapy: Patient Spontanous Breathing and Patient connected to nasal cannula oxygen  Post-op Assessment: Report given to RN and Post -op Vital signs reviewed and stable  Post vital signs: Reviewed  Last Vitals:  Vitals Value Taken Time  BP 131/91 09/11/21 0735  Temp    Pulse 90 09/11/21 0736  Resp 19 09/11/21 0736  SpO2 99 % 09/11/21 0736  Vitals shown include unvalidated device data.  Last Pain:  Vitals:   09/11/21 0711  TempSrc: Oral  PainSc: 0-No pain         Complications: No notable events documented.

## 2021-09-12 ENCOUNTER — Encounter: Payer: Self-pay | Admitting: Cardiology

## 2021-09-12 NOTE — Anesthesia Postprocedure Evaluation (Signed)
Anesthesia Post Note  Patient: Louis Luna  Procedure(s) Performed: CARDIOVERSION  Patient location during evaluation: PACU Anesthesia Type: General Level of consciousness: awake and alert Pain management: pain level controlled Vital Signs Assessment: post-procedure vital signs reviewed and stable Respiratory status: spontaneous breathing, nonlabored ventilation, respiratory function stable and patient connected to nasal cannula oxygen Cardiovascular status: blood pressure returned to baseline and stable Postop Assessment: no apparent nausea or vomiting Anesthetic complications: no   No notable events documented.   Last Vitals:  Vitals:   09/11/21 0900 09/11/21 0915  BP: (!) 129/91 124/89  Pulse: 78 81  Resp: 19 16  Temp:    SpO2: 97% 96%    Last Pain:  Vitals:   09/11/21 0800  TempSrc:   PainSc: 0-No pain                 Molli Barrows

## 2021-09-19 ENCOUNTER — Other Ambulatory Visit: Payer: Self-pay

## 2021-09-19 ENCOUNTER — Other Ambulatory Visit (INDEPENDENT_AMBULATORY_CARE_PROVIDER_SITE_OTHER): Payer: Medicare HMO

## 2021-09-19 DIAGNOSIS — I4819 Other persistent atrial fibrillation: Secondary | ICD-10-CM | POA: Diagnosis not present

## 2021-09-20 LAB — BASIC METABOLIC PANEL
BUN/Creatinine Ratio: 13 (ref 10–24)
BUN: 13 mg/dL (ref 8–27)
CO2: 27 mmol/L (ref 20–29)
Calcium: 9.4 mg/dL (ref 8.6–10.2)
Chloride: 100 mmol/L (ref 96–106)
Creatinine, Ser: 1.01 mg/dL (ref 0.76–1.27)
Glucose: 103 mg/dL — ABNORMAL HIGH (ref 70–99)
Potassium: 3.7 mmol/L (ref 3.5–5.2)
Sodium: 142 mmol/L (ref 134–144)
eGFR: 79 mL/min/{1.73_m2} (ref >59)

## 2021-09-25 ENCOUNTER — Other Ambulatory Visit: Payer: Self-pay

## 2021-09-25 ENCOUNTER — Ambulatory Visit: Payer: Medicare HMO | Admitting: Cardiology

## 2021-09-25 ENCOUNTER — Encounter: Payer: Self-pay | Admitting: Cardiology

## 2021-09-25 VITALS — BP 137/83 | HR 79 | Ht 69.0 in | Wt 222.0 lb

## 2021-09-25 DIAGNOSIS — I10 Essential (primary) hypertension: Secondary | ICD-10-CM | POA: Diagnosis not present

## 2021-09-25 DIAGNOSIS — I4819 Other persistent atrial fibrillation: Secondary | ICD-10-CM

## 2021-09-25 DIAGNOSIS — E7800 Pure hypercholesterolemia, unspecified: Secondary | ICD-10-CM | POA: Diagnosis not present

## 2021-09-25 NOTE — Patient Instructions (Addendum)
Medication Instructions:  Your physician recommends that you continue on your current medications as directed. Please refer to the Current Medication list given to you today.  *If you need a refill on your cardiac medications before your next appointment, please call your pharmacy*   Lab Work: None ordered  If you have labs (blood work) drawn today and your tests are completely normal, you will receive your results only by: [place] (if you have MyChart) OR A paper copy in the mail If you have any lab test that is abnormal or we need to change your treatment, we will call you to review the results.   Testing/Procedures: None ordered  Follow-Up: At [facility], you and your health needs are our priority.  As part of our continuing mission to provide you with exceptional heart care, we have created designated Provider Care Teams.  These Care Teams include your primary Cardiologist (physician) and Advanced Practice Providers (APPs -  Physician Assistants and Nurse Practitioners) who all work together to provide you with the care you need, when you need it.  We recommend signing up for the patient portal called "MyChart".  Sign up information is provided on this After Visit Summary.  MyChart is used to connect with patients for Virtual Visits (Telemedicine).  Patients are able to view lab/test results, encounter notes, upcoming appointments, etc.  Non-urgent messages can be sent to your provider as well.   To learn more about what you can do with MyChart, go to [email or web address].    Your next appointment:   6 month(s)  The format for your next appointment:   In Person  Provider:   You may see Kate Sable, MD or one of the following Advanced Practice Providers on your designated Care Team:   Murray Hodgkins, NP Christell Faith, PA-C Cadence Kathlen Mody, [place]

## 2021-09-25 NOTE — Progress Notes (Signed)
Cardiology Office Note:    Date:  09/25/2021   ID:  Louis Luna May 30, 1951, MRN [id number]  PCP:  Louis Luna., MD   [facility] Providers Cardiologist:  Louis Sable, MD     Referring MD: Louis Luna.,*   No chief complaint on file.   History of Present Illness:    Louis Luna is a 71 y.o. male with a hx of atrial fibrillation s/p DCCV 08/2021, hypertension, hyperlipidemia, former smoker x20+ years who presents for follow-up.    He was previously seen due to persistent atrial fibrillation.  Underwent DC cardioversion successfully on 09/11/2021.  He feels well, plans to exercise more, states eating low-cholesterol diet.  Denies chest pain, palpitations, takes atenolol and Xarelto as prescribed.  Denies any bleeding issues.    Prior notes Echo 05/2021 EF 50 to 55%.  Mildly dilated LA. DCCV 08/2021  Past Medical History:  Diagnosis Date   Depression    GERD (gastroesophageal reflux disease)    H/O elevated lipids    HLD (hyperlipidemia)    Hypertension    Personal history of tobacco use, presenting hazards to health 01/26/2016   Sleep apnea     Past Surgical History:  Procedure Laterality Date   ANKLE SURGERY     APPENDECTOMY     CARDIOVERSION N/A 09/11/2021   Procedure: CARDIOVERSION;  Surgeon: Louis Sable, MD;  Location: [place] [facility];  Service: Cardiovascular;  Laterality: N/A;   COLONOSCOPY N/A 03/23/2016   Procedure: COLONOSCOPY;  Surgeon: Louis Silvas, MD;  Location: [place];  Service: Endoscopy;  Laterality: N/A;   HERNIA REPAIR     inguinal-left 04/1979   QUADRICEPS TENDON REPAIR Left 05/05/2015   Procedure: REPAIR QUADRICEP TENDON;  Surgeon: Louis Knows, MD;  Location: [facility] ORS;  Service: Orthopedics;  Laterality: Left;   TUMOR EXCISION     from Rt side of the neck-benign    Current Medications: Current Meds  Medication Sig   amLODipine (NORVASC) 5 MG tablet TAKE 1 TABLET(5 MG) BY MOUTH DAILY    atenolol (TENORMIN) 50 MG tablet TAKE 1 TABLET BY MOUTH EVERY DAY   atorvastatin (LIPITOR) 40 MG tablet Take 1 tablet (40 mg total) by mouth daily.   chlorthalidone (HYGROTON) 25 MG tablet TAKE 1 TABLET(25 MG) BY MOUTH EVERY MORNING   Coenzyme Q10 (CO Q 10) 100 MG CAPS Take 100 mg by mouth daily.   Dextromethorphan-guaiFENesin (MUCINEX DM PO) Take 1,200 mg by mouth daily as needed (Congestion).   diphenhydrAMINE (BENADRYL) 25 MG tablet Take 25 mg by mouth every 6 (six) hours as needed for sleep.   docusate sodium (COLACE) 100 MG capsule Take 200 mg by mouth 2 (two) times daily as needed for mild constipation or moderate constipation.   ibuprofen (ADVIL) 200 MG tablet Take 200 mg by mouth every 6 (six) hours as needed for headache or mild pain.   MILK THISTLE PO Take 70 mg by mouth daily. Liver health   Misc Natural Products (PROSTATE HEALTH PO) Take 250 mg by mouth in the morning and at bedtime. Super Beta Prostate   Omega-3-6-9 CAPS Take 1 capsule by mouth 2 (two) times daily.   potassium chloride (KLOR-CON) 10 MEQ tablet Take 1 tablet (10 mEq total) by mouth 2 (two) times daily.   psyllium (METAMUCIL SMOOTH TEXTURE) 28 % packet Take 1 packet by mouth 2 (two) times daily.   rivaroxaban (XARELTO) 20 MG TABS tablet Take 1 tablet (20 mg total) by mouth daily with  supper.   sertraline (ZOLOFT) 50 MG tablet TAKE 1 TABLET BY MOUTH EVERY DAY   vitamin B-12 (CYANOCOBALAMIN) 500 MCG tablet Take 500 mcg by mouth daily.   Vitamin D3 (VITAMIN D) 25 MCG tablet Take 1,000 Units by mouth daily.     Allergies:   Patient has no known allergies.   Social History   Socioeconomic History   Marital status: Married    Spouse name: Louis Luna   Number of children: 2   Years of education: 18   Highest education level: Not on file  Occupational History   Occupation: retired  Tobacco Use   Smoking status: Former    Packs/day: 1.00    Years: 30.00    Pack years: 30.00    Types: Cigarettes    Quit date:  10/26/2011    Years since quitting: 9.9   Smokeless tobacco: Never  Vaping Use   Vaping Use: Never used  Substance and Sexual Activity   Alcohol use: Yes    Comment: ocassionally-beer   Drug use: No   Sexual activity: Never    Birth control/protection: None  Other Topics Concern   Not on file  Social History Narrative   Not on file   Social Determinants of Health   Financial Resource Strain: Not on file  Food Insecurity: Not on file  Transportation Needs: Not on file  Physical Activity: Not on file  Stress: Not on file  Social Connections: Not on file     Family History: The patient's family history includes Alcohol abuse in his brother and father; Alzheimer's disease in his father; Breast cancer in his mother, sister, and sister; Depression in his sister and sister; Diabetes in his mother; Hypertension in his brother, brother, father, and mother; Melanoma in his sister.  ROS:   Please see the history of present illness.     All other systems reviewed and are negative.  EKGs/Labs/Other Studies Reviewed:    The following studies were reviewed today:   EKG:  EKG is  ordered today.  The ekg ordered today demonstrates normal sinus rhythm.  Recent Labs: 05/26/2021: ALT 21 09/04/2021: Hemoglobin 15.8; Platelets 167 09/19/2021: BUN 13; Creatinine, Ser 1.01; Potassium 3.7; Sodium 142  Recent Lipid Panel    Component Value Date/Time   CHOL 142 09/04/2021 1038   TRIG 150 (H) 09/04/2021 1038   HDL 39 (L) 09/04/2021 1038   CHOLHDL 3.6 09/04/2021 1038   LDLCALC 77 09/04/2021 1038     Risk Assessment/Calculations:          Physical Exam:    VS:  BP 137/83    Pulse 79    Ht 5\' 9"  (1.753 m)    Wt 222 lb (100.7 kg)    SpO2 97%    BMI 32.78 kg/m     Wt Readings from Last 3 Encounters:  09/25/21 222 lb (100.7 kg)  09/11/21 215 lb (97.5 kg)  09/04/21 221 lb (100.2 kg)     GEN:  Well nourished, well developed in no acute distress HEENT: Normal NECK: No JVD; No carotid  bruits LYMPHATICS: No lymphadenopathy CARDIAC: Irregular irregular RESPIRATORY:  Clear to auscultation without rales, wheezing or rhonchi  ABDOMEN: Soft, non-tender, non-distended MUSCULOSKELETAL:  No edema; No deformity  SKIN: Warm and dry NEUROLOGIC:  Alert and oriented x 3 PSYCHIATRIC:  Normal affect   ASSESSMENT:    1. Persistent atrial fibrillation ([place])   2. Primary hypertension   3. Pure hypercholesterolemia      PLAN:  In order of problems listed above:  Persistent A. fib, s/p DC cardioversion 08/2021.  CHA2DS2-VASc score 2 (htn, age).  Heart rate controlled, continue atenolol 50 mg daily, Xarelto 20 mg daily.  Echo with preserved EF.  Keep appointment with EP/A. fib clinic. Hypertension, BP controlled continue chlorthalidone, amlodipine, atenolol. Hyperlipidemia, lipid panel improving, continue Lipitor 40 mg daily.  Follow-up in 6 months.     Medication Adjustments/Labs and Tests Ordered: Current medicines are reviewed at length with the patient today.  Concerns regarding medicines are outlined above.  Orders Placed This Encounter  Procedures   EKG 12-Lead    No orders of the defined types were placed in this encounter.    Patient Instructions  Medication Instructions:  Your physician recommends that you continue on your current medications as directed. Please refer to the Current Medication list given to you today.  *If you need a refill on your cardiac medications before your next appointment, please call your pharmacy*   Lab Work: None ordered  If you have labs (blood work) drawn today and your tests are completely normal, you will receive your results only by: [place] (if you have MyChart) OR A paper copy in the mail If you have any lab test that is abnormal or we need to change your treatment, we will call you to review the results.   Testing/Procedures: None ordered  Follow-Up: At [facility], you and your health needs are our  priority.  As part of our continuing mission to provide you with exceptional heart care, we have created designated Provider Care Teams.  These Care Teams include your primary Cardiologist (physician) and Advanced Practice Providers (APPs -  Physician Assistants and Nurse Practitioners) who all work together to provide you with the care you need, when you need it.  We recommend signing up for the patient portal called "MyChart".  Sign up information is provided on this After Visit Summary.  MyChart is used to connect with patients for Virtual Visits (Telemedicine).  Patients are able to view lab/test results, encounter notes, upcoming appointments, etc.  Non-urgent messages can be sent to your provider as well.   To learn more about what you can do with MyChart, go to [email or web address].    Your next appointment:   6 month(s)  The format for your next appointment:   In Person  Provider:   You may see Louis Sable, MD or one of the following Advanced Practice Providers on your designated Care Team:   Murray Hodgkins, NP Christell Faith, PA-C Cadence Kathlen Mody, [place]     Signed, Louis Sable, MD  09/25/2021 5:18 PM    [place]

## 2021-10-11 ENCOUNTER — Encounter: Payer: Self-pay | Admitting: Family Medicine

## 2021-10-11 ENCOUNTER — Ambulatory Visit (INDEPENDENT_AMBULATORY_CARE_PROVIDER_SITE_OTHER): Payer: Medicare HMO | Admitting: Family Medicine

## 2021-10-11 ENCOUNTER — Other Ambulatory Visit: Payer: Self-pay

## 2021-10-11 VITALS — BP 116/76 | HR 86 | Temp 98.0°F | Resp 16 | Wt 224.0 lb

## 2021-10-11 DIAGNOSIS — Z0000 Encounter for general adult medical examination without abnormal findings: Secondary | ICD-10-CM

## 2021-10-11 DIAGNOSIS — R319 Hematuria, unspecified: Secondary | ICD-10-CM | POA: Diagnosis not present

## 2021-10-11 DIAGNOSIS — I10 Essential (primary) hypertension: Secondary | ICD-10-CM | POA: Diagnosis not present

## 2021-10-11 DIAGNOSIS — I4819 Other persistent atrial fibrillation: Secondary | ICD-10-CM

## 2021-10-11 DIAGNOSIS — E7800 Pure hypercholesterolemia, unspecified: Secondary | ICD-10-CM | POA: Diagnosis not present

## 2021-10-11 DIAGNOSIS — Z1389 Encounter for screening for other disorder: Secondary | ICD-10-CM | POA: Diagnosis not present

## 2021-10-11 LAB — POCT URINALYSIS DIPSTICK
Bilirubin, UA: NEGATIVE
Glucose, UA: NEGATIVE
Ketones, UA: NEGATIVE
Nitrite, UA: NEGATIVE
Protein, UA: NEGATIVE
Spec Grav, UA: 1.01 (ref 1.010–1.025)
Urobilinogen, UA: 0.2 E.U./dL
pH, UA: 6.5 (ref 5.0–8.0)

## 2021-10-11 NOTE — Progress Notes (Signed)
Annual Wellness Visit     Patient: KOLTEN RYBACK, Male    DOB: 06-Feb-1951, 71 y.o.   MRN: [id number] Visit Date: 10/11/2021  Today's Provider: [name], MD   Chief Complaint  Patient presents with   Medicare Wellness   Subjective    MERCURY ROCK is a 71 y.o. male who presents today for his Annual Wellness Visit.Annual Physical. He reports consuming a general diet. Home exercise routine includes walking. He generally feels well. He reports sleeping fairly well. He does not have additional problems to discuss today.   HPI    Medications: Outpatient Medications Prior to Visit  Medication Sig   amLODipine (NORVASC) 5 MG tablet TAKE 1 TABLET(5 MG) BY MOUTH DAILY   atenolol (TENORMIN) 50 MG tablet TAKE 1 TABLET BY MOUTH EVERY DAY   chlorthalidone (HYGROTON) 25 MG tablet TAKE 1 TABLET(25 MG) BY MOUTH EVERY MORNING   Coenzyme Q10 (CO Q 10) 100 MG CAPS Take 100 mg by mouth daily.   Dextromethorphan-guaiFENesin (MUCINEX DM PO) Take 1,200 mg by mouth daily as needed (Congestion).   diphenhydrAMINE (BENADRYL) 25 MG tablet Take 25 mg by mouth every 6 (six) hours as needed for sleep.   docusate sodium (COLACE) 100 MG capsule Take 200 mg by mouth 2 (two) times daily as needed for mild constipation or moderate constipation.   ibuprofen (ADVIL) 200 MG tablet Take 200 mg by mouth every 6 (six) hours as needed for headache or mild pain.   MILK THISTLE PO Take 70 mg by mouth daily. Liver health   Misc Natural Products (PROSTATE HEALTH PO) Take 250 mg by mouth in the morning and at bedtime. Super Beta Prostate   Omega-3-6-9 CAPS Take 1 capsule by mouth 2 (two) times daily.   potassium chloride (KLOR-CON) 10 MEQ tablet Take 1 tablet (10 mEq total) by mouth 2 (two) times daily.   psyllium (METAMUCIL SMOOTH TEXTURE) 28 % packet Take 1 packet by mouth 2 (two) times daily.   rivaroxaban (XARELTO) 20 MG TABS tablet Take 1 tablet (20 mg total) by mouth daily with supper.    sertraline (ZOLOFT) 50 MG tablet TAKE 1 TABLET BY MOUTH EVERY DAY   vitamin B-12 (CYANOCOBALAMIN) 500 MCG tablet Take 500 mcg by mouth daily.   Vitamin D3 (VITAMIN D) 25 MCG tablet Take 1,000 Units by mouth daily.   [DISCONTINUED] atorvastatin (LIPITOR) 40 MG tablet Take 1 tablet (40 mg total) by mouth daily.   No facility-administered medications prior to visit.    No Known Allergies  Patient Care Team: Jerrol Banana., MD as PCP - General (Family Medicine) Kate Sable, MD as PCP - Cardiology (Cardiology) Dingeldein, Remo Lipps, MD as Consulting Physician (Ophthalmology)  Review of Systems  Constitutional:  Positive for diaphoresis.  HENT:  Positive for sneezing.   Genitourinary:  Positive for frequency.  All other systems reviewed and are negative.       Objective    Vitals: BP 116/76 (BP Location: Right Arm, Patient Position: Sitting, Cuff Size: Normal)    Pulse 86    Temp 98 F (36.7 C) (Temporal)    Resp 16    Wt 224 lb (101.6 kg)    SpO2 96%    BMI 33.08 kg/m  BP Readings from Last 3 Encounters:  10/11/21 116/76  09/25/21 137/83  09/11/21 124/89   Wt Readings from Last 3 Encounters:  10/11/21 224 lb (101.6 kg)  09/25/21 222 lb (100.7 kg)  09/11/21 215 lb (97.5  kg)      Physical Exam Vitals reviewed.  Constitutional:      Appearance: Normal appearance.  HENT:     Head: Normocephalic and atraumatic.     Right Ear: Tympanic membrane and external ear normal.     Left Ear: Tympanic membrane and external ear normal.  Eyes:     General: No scleral icterus.    Conjunctiva/sclera: Conjunctivae normal.  Cardiovascular:     Rate and Rhythm: Normal rate. Rhythm irregular.     Pulses: Normal pulses.     Heart sounds: Normal heart sounds.  Pulmonary:     Effort: Pulmonary effort is normal.     Breath sounds: Normal breath sounds.  Abdominal:     Palpations: Abdomen is soft.  Skin:    General: Skin is warm and dry.     Comments: Multiple lipomas.   Neurological:     General: No focal deficit present.     Mental Status: He is alert and oriented to person, place, and time.  Psychiatric:        Mood and Affect: Mood normal.        Behavior: Behavior normal.        Thought Content: Thought content normal.        Judgment: Judgment normal.     Most recent functional status assessment: In your present state of health, do you have any difficulty performing the following activities: 10/11/2021  Hearing? N  Vision? N  Difficulty concentrating or making decisions? N  Walking or climbing stairs? N  Dressing or bathing? N  Doing errands, shopping? N  Some recent data might be hidden   Most recent fall risk assessment: Fall Risk  10/11/2021  Falls in the past year? 1  Number falls in past yr: 0  Injury with Fall? 0  Risk for fall due to : History of fall(s)  Follow up Falls evaluation completed    Most recent depression screenings: PHQ 2/9 Scores 10/11/2021 06/27/2020  PHQ - 2 Score 0 0  PHQ- 9 Score 3 2   Most recent cognitive screening: 6CIT Screen 03/13/2017  What Year? 0 points  What month? 0 points  What time? 0 points  Count back from 20 0 points  Months in reverse 0 points  Repeat phrase 0 points  Total Score 0   Most recent Audit-C alcohol use screening Alcohol Use Disorder Test (AUDIT) 10/11/2021  1. How often do you have a drink containing alcohol? 2  2. How many drinks containing alcohol do you have on a typical day when you are drinking? 0  3. How often do you have six or more drinks on one occasion? 0  AUDIT-C Score 2   A score of 3 or more in women, and 4 or more in men indicates increased risk for alcohol abuse, EXCEPT if all of the points are from question 1   No results found for any visits on 10/11/21.  Assessment & Plan     Annual wellness visit done today including the all of the following: Reviewed patient's Family Medical History Reviewed and updated list of patient's medical providers Assessment  of cognitive impairment was done Assessed patient's functional ability Established a written schedule for health screening [place] Completed and Reviewed  Exercise Activities and Dietary recommendations  Goals      Exercise 150 minutes per week (moderate activity)     Continue exercising for over 150 minutes a week.  Immunization History  Administered Date(s) Administered   Fluad Quad(high Dose 65+) 06/05/2019   Influenza Split 06/28/2011, 07/14/2012   Influenza, High Dose Seasonal PF 06/11/2016, 07/15/2018, 06/22/2021   Influenza-Unspecified 06/08/2020   PFIZER(Purple Top)SARS-COV-2 Vaccination 10/25/2019, 11/18/2019, 08/04/2020   Pneumococcal Conjugate-13 03/13/2017   Pneumococcal Polysaccharide-23 07/09/2019   Tdap 08/19/2007   Zoster, Live 02/07/2012    Health Maintenance  Topic Date Due   Zoster Vaccines- Shingrix (1 of 2) Never done   TETANUS/TDAP  10/11/2022 (Originally 08/18/2017)   COVID-19 Vaccine (4 - Booster for [place] series) 10/11/2022 (Originally 09/29/2020)   COLONOSCOPY (Pts 45-26yrs Insurance coverage will need to be confirmed)  03/23/2026   Pneumonia Vaccine 15+ Years old  Completed   INFLUENZA VACCINE  Completed   Hepatitis C Screening  Completed   HPV VACCINES  Aged Out     Discussed health benefits of physical activity, and encouraged him to engage in regular exercise appropriate for his age and condition.    1. Encounter for Medicare annual wellness exam   2. Annual physical exam   3. Screening for blood or protein in urine  - POCT urinalysis dipstick  4. Hematuria, unspecified type  - CULTURE, URINE COMPREHENSIVE  5. Primary hypertension   6. Persistent atrial fibrillation ([place]) Now on treatment followed by cardiology  7. Pure hypercholesterolemia    Return in about 6 months (around 04/10/2022).     I, [name], MD, have reviewed all documentation for this visit. The documentation on  10/16/21 for the exam, diagnosis, procedures, and orders are all accurate and complete.    Eshan Trupiano Cranford Mon, MD  [facility] [phone] (phone) [phone] (fax)  [place]

## 2021-10-12 DIAGNOSIS — R319 Hematuria, unspecified: Secondary | ICD-10-CM | POA: Diagnosis not present

## 2021-10-19 LAB — CULTURE, URINE COMPREHENSIVE

## 2021-10-25 ENCOUNTER — Encounter: Payer: Self-pay | Admitting: Cardiology

## 2021-10-25 ENCOUNTER — Other Ambulatory Visit: Payer: Self-pay

## 2021-10-25 ENCOUNTER — Ambulatory Visit (INDEPENDENT_AMBULATORY_CARE_PROVIDER_SITE_OTHER): Payer: Medicare HMO | Admitting: Cardiology

## 2021-10-25 VITALS — BP 150/108 | HR 93 | Ht 69.0 in | Wt 225.0 lb

## 2021-10-25 DIAGNOSIS — I4821 Permanent atrial fibrillation: Secondary | ICD-10-CM

## 2021-10-25 DIAGNOSIS — I10 Essential (primary) hypertension: Secondary | ICD-10-CM

## 2021-10-25 NOTE — Patient Instructions (Signed)

## 2021-10-25 NOTE — Progress Notes (Signed)
?Electrophysiology Office Note:   ? ?Date:  10/25/2021  ? ?ID:  Louis Luna, DOB [birth year], MRN [id number] ? ?PCP:  Jerrol Banana., MD  ?[facility] Cardiologist:  Kate Sable, MD  ?[facility] Electrophysiologist:  Vickie Epley, MD  ? ?Referring MD: Kate Sable, MD  ? ?Chief Complaint: Persistent atrial fibrillation ? ?History of Present Illness:   ? ?Louis Luna is a 71 y.o. male who presents for an evaluation of persistent atrial fibrillation at the request of Dr. Garen Lah. Their medical history includes hypertension, hyperlipidemia, prior tobacco abuse.  The patient was last in by Dr. Garen Lah September 25, 2021. ? ?Patient had a cardioversion September 11, 2021.  He is on Xarelto for stroke prophylaxis. ? ?Today he confirms he is asymptomatic when atrial fibrillation.  He mowed his grass yesterday with a push mower without any limitation.  He has gained weight recently but this is unrelated to his atrial fibrillation.  He takes his anticoagulant without any bleeding issues.  His brother has atrial fibrillation.   ? ?The patient's diagnosis of atrial fibrillation was made when he presented for a preop evaluation before hernia surgery. ? ?  ?Past Medical History:  ?Diagnosis Date  ? Depression   ? GERD (gastroesophageal reflux disease)   ? H/O elevated lipids   ? HLD (hyperlipidemia)   ? Hypertension   ? Personal history of tobacco use, presenting hazards to health 01/26/2016  ? Sleep apnea   ? ? ?Past Surgical History:  ?Procedure Laterality Date  ? ANKLE SURGERY    ? APPENDECTOMY    ? CARDIOVERSION N/A 09/11/2021  ? Procedure: CARDIOVERSION;  Surgeon: Kate Sable, MD;  Location: [facility] ORS;  Service: Cardiovascular;  Laterality: N/A;  ? COLONOSCOPY N/A 03/23/2016  ? Procedure: COLONOSCOPY;  Surgeon: Manya Silvas, MD;  Location: [facility] ENDOSCOPY;  Service: Endoscopy;  Laterality: N/A;  ? HERNIA REPAIR    ? inguinal-left 04/1979  ? QUADRICEPS TENDON REPAIR Left  05/05/2015  ? Procedure: REPAIR QUADRICEP TENDON;  Surgeon: Hessie Knows, MD;  Location: [facility] ORS;  Service: Orthopedics;  Laterality: Left;  ? TUMOR EXCISION    ? from Rt side of the neck-benign  ? ? ?Current Medications: ?Current Meds  ?Medication Sig  ? amLODipine (NORVASC) 5 MG tablet TAKE 1 TABLET(5 MG) BY MOUTH DAILY  ? atenolol (TENORMIN) 50 MG tablet TAKE 1 TABLET BY MOUTH EVERY DAY  ? chlorthalidone (HYGROTON) 25 MG tablet TAKE 1 TABLET(25 MG) BY MOUTH EVERY MORNING  ? Coenzyme Q10 (CO Q 10) 100 MG CAPS Take 100 mg by mouth daily.  ? Dextromethorphan-guaiFENesin (MUCINEX DM PO) Take 1,200 mg by mouth daily as needed (Congestion).  ? diphenhydrAMINE (BENADRYL) 25 MG tablet Take 25 mg by mouth every 6 (six) hours as needed for sleep.  ? docusate sodium (COLACE) 100 MG capsule Take 200 mg by mouth 2 (two) times daily as needed for mild constipation or moderate constipation.  ? MILK THISTLE PO Take 70 mg by mouth daily. Liver health  ? Misc Natural Products (PROSTATE HEALTH PO) Take 250 mg by mouth in the morning and at bedtime. Super Beta Prostate  ? Omega-3-6-9 CAPS Take 1 capsule by mouth 2 (two) times daily.  ? potassium chloride (KLOR-CON) 10 MEQ tablet Take 1 tablet (10 mEq total) by mouth 2 (two) times daily.  ? psyllium (METAMUCIL SMOOTH TEXTURE) 28 % packet Take 1 packet by mouth 2 (two) times daily.  ? rivaroxaban (XARELTO) 20 MG TABS tablet Take 1  tablet (20 mg total) by mouth daily with supper.  ? sertraline (ZOLOFT) 50 MG tablet TAKE 1 TABLET BY MOUTH EVERY DAY  ? vitamin B-12 (CYANOCOBALAMIN) 500 MCG tablet Take 500 mcg by mouth daily.  ? Vitamin D3 (VITAMIN D) 25 MCG tablet Take 1,000 Units by mouth daily.  ?  ? ?Allergies:   Patient has no known allergies.  ? ?Social History  ? ?Socioeconomic History  ? Marital status: Married  ?  Spouse name: [name]  ? Number of children: 2  ? Years of education: 23  ? Highest education level: Not on file  ?Occupational History  ? Occupation: retired  ?Tobacco  Use  ? Smoking status: Former  ?  Packs/day: 1.00  ?  Years: 30.00  ?  Pack years: 30.00  ?  Types: Cigarettes  ?  Quit date: 10/26/2011  ?  Years since quitting: 10.0  ? Smokeless tobacco: Never  ?Vaping Use  ? Vaping Use: Never used  ?Substance and Sexual Activity  ? Alcohol use: Yes  ?  Comment: ocassionally-beer  ? Drug use: No  ? Sexual activity: Never  ?  Birth control/protection: None  ?Other Topics Concern  ? Not on file  ?Social History Narrative  ? Not on file  ? ?Social Determinants of Health  ? ?Financial Resource Strain: Not on file  ?Food Insecurity: Not on file  ?Transportation Needs: Not on file  ?Physical Activity: Not on file  ?Stress: Not on file  ?Social Connections: Not on file  ?  ? ?Family History: ?The patient's family history includes Alcohol abuse in his brother and father; Alzheimer's disease in his father; Breast cancer in his mother, sister, and sister; Depression in his sister and sister; Diabetes in his mother; Hypertension in his brother, brother, father, and mother; Melanoma in his sister. ? ?ROS:   ?Please see the history of present illness.    ?All other systems reviewed and are negative. ? ?EKGs/Labs/Other Studies Reviewed:   ? ?The following studies were reviewed today: ? ?June 06, 2021 echo ?Left ventricular function normal, 50 to 55% ?Right ventricular function normal ?Mildly dilated left atrium ?No MR ? ?EKG:  The ekg ordered today demonstrates atrial fibrillation with a controlled ventricular rate of 93 bpm. ? ?September 25, 2021 EKG in sinus rhythm shows a QTc of 450 ms. ? ?Recent Labs: ?05/26/2021: ALT 21 ?09/04/2021: Hemoglobin 15.8; Platelets 167 ?09/19/2021: BUN 13; Creatinine, Ser 1.01; Potassium 3.7; Sodium 142  ?Recent Lipid Panel ?   ?Component Value Date/Time  ? CHOL 142 09/04/2021 1038  ? TRIG 150 (H) 09/04/2021 1038  ? HDL 39 (L) 09/04/2021 1038  ? CHOLHDL 3.6 09/04/2021 1038  ? [place] 77 09/04/2021 1038  ? ? ?Physical Exam:   ? ?VS:  BP (!) 150/108 (BP Location: Left  Arm, Patient Position: Sitting, Cuff Size: Normal)   Pulse 93   Ht 5\' 9"  (1.753 m)   Wt 225 lb (102.1 kg)   SpO2 96%   BMI 33.23 kg/m?    ? ?Wt Readings from Last 3 Encounters:  ?10/25/21 225 lb (102.1 kg)  ?10/11/21 224 lb (101.6 kg)  ?09/25/21 222 lb (100.7 kg)  ?  ? ?GEN:  Well nourished, well developed in no acute distress, obese ?HEENT: Normal ?NECK: No JVD; No carotid bruits ?LYMPHATICS: No lymphadenopathy ?CARDIAC: Irregularly irregular, no murmurs, rubs, gallops ?RESPIRATORY:  Clear to auscultation without rales, wheezing or rhonchi  ?ABDOMEN: Soft, non-tender, non-distended ?MUSCULOSKELETAL:  No edema; No deformity  ?SKIN: Warm and  dry ?NEUROLOGIC:  Alert and oriented x 3 ?PSYCHIATRIC:  Normal affect  ? ? ?  ? ?ASSESSMENT:   ? ?1. Permanent atrial fibrillation ([place])   ?2. Primary hypertension   ? ?PLAN:   ? ?In order of problems listed above: ? ?#Permanent atrial fibrillation ?On Xarelto for stroke prophylaxis.  At this point I would focus her efforts on rate control.  I did discuss antiarrhythmic drugs and catheter ablation briefly with the patient but given the unclear duration of his atrial fibrillation and response after cardioversion attempt, I do not think efforts at restoring normal rhythm would be fruitful.  I would recommend routine monitoring of his left ventricular function with his primary cardiologist. ? ?#Hypertension ?Slightly above goal today in clinic.  Continue monitoring blood pressures at home 1-2 times per week and bring those recordings to your primary cardiologist for further evaluation and treatment. ? ?Follow-up with EP on an as-needed basis. ? ? ? ?Medication Adjustments/Labs and Tests Ordered: ?Current medicines are reviewed at length with the patient today.  Concerns regarding medicines are outlined above.  ?No orders of the defined types were placed in this encounter. ? ?No orders of the defined types were placed in this encounter. ? ? ? ?Signed, ?Lysbeth Galas T. Quentin Ore, MD,  [facility], [place] ?10/25/2021 9:41 AM    ?Electrophysiology ?[place] ?

## 2021-11-24 ENCOUNTER — Other Ambulatory Visit: Payer: Self-pay | Admitting: *Deleted

## 2021-11-24 MED ORDER — RIVAROXABAN 20 MG PO TABS: 20.0000 mg | ORAL_TABLET | Freq: Every day | ORAL | 5 refills | Status: DC

## 2021-11-24 NOTE — Telephone Encounter (Signed)
Patient came into the office requesting a refill on Xarelto. Patient is completely out of medication and need refills sent to [facility] on file per patient request. Informed patient I would send request to anti coag clinic and check with the pharmacy in a few hours.  ?

## 2021-11-24 NOTE — Telephone Encounter (Signed)
Prescription refill request for Xarelto received.  ?Indication: afib  ?Last office visit: lambert, 10/25/2021 ?Weight: 102.1 kg  ?Age: 71 yo  ?Scr: 1.01, 09/19/2021 ?CrCl:  95 ml/min  ? ?Refill sent.  ?

## 2021-11-30 ENCOUNTER — Other Ambulatory Visit: Payer: Self-pay | Admitting: Family Medicine

## 2021-11-30 DIAGNOSIS — E876 Hypokalemia: Secondary | ICD-10-CM

## 2021-12-11 ENCOUNTER — Telehealth: Payer: Self-pay | Admitting: Cardiology

## 2021-12-11 MED ORDER — ATORVASTATIN CALCIUM 40 MG PO TABS: 40.0000 mg | ORAL_TABLET | Freq: Every day | ORAL | 0 refills | Status: DC

## 2021-12-11 NOTE — Telephone Encounter (Signed)
Requested Prescriptions  ? ?Signed Prescriptions Disp Refills  ? atorvastatin (LIPITOR) 40 MG tablet 90 tablet 0  ?  Sig: Take 1 tablet (40 mg total) by mouth daily. PLEASE SCHEDULE OFFICE VISIT FOR FURTHER REFILLS. THANK YOU!  ?  Authorizing Provider: Kate Sable  ?  Ordering User: NEWCOMER MCCLAIN, Iyesha Such L  ? ? ?

## 2021-12-11 NOTE — Telephone Encounter (Signed)
?*  STAT* If patient is at the pharmacy, call can be transferred to refill team. ? ? ?1. Which medications need to be refilled? (please list name of each medication and dose if known)  ?atorvastatin (LIPITOR) 40 MG tablet  ? ?2. Which pharmacy/location (including street and city if local pharmacy) is medication to be sent to? ?[id number] - [place], [place] ? ?3. Do they need a 30 day or 90 day supply? 90 with refills  ?

## 2022-01-18 DIAGNOSIS — Z0100 Encounter for examination of eyes and vision without abnormal findings: Secondary | ICD-10-CM | POA: Diagnosis not present

## 2022-01-18 DIAGNOSIS — H524 Presbyopia: Secondary | ICD-10-CM | POA: Diagnosis not present

## 2022-01-30 ENCOUNTER — Ambulatory Visit (INDEPENDENT_AMBULATORY_CARE_PROVIDER_SITE_OTHER): Payer: Medicare HMO | Admitting: Physician Assistant

## 2022-01-30 ENCOUNTER — Encounter: Payer: Self-pay | Admitting: Physician Assistant

## 2022-01-30 VITALS — BP 129/86 | HR 86 | Ht 68.0 in | Wt 227.9 lb

## 2022-01-30 DIAGNOSIS — M545 Low back pain, unspecified: Secondary | ICD-10-CM | POA: Diagnosis not present

## 2022-01-30 MED ORDER — METHYLPREDNISOLONE 4 MG PO TBPK: ORAL_TABLET | ORAL | 0 refills | Status: DC

## 2022-01-30 NOTE — Progress Notes (Signed)
    I,Sha'taria Tyson,acting as a Education administrator for [employer], PA-C.,have documented all relevant documentation on the behalf of Mikey Kirschner, PA-C,as directed by  Mikey Kirschner, PA-C while in the presence of Mikey Kirschner, PA-C.   Acute Office Visit  Subjective:     Patient ID: Louis Luna, male    DOB: 03-20-1951, 71 y.o.   MRN: [id number]  Cc. Back pain  Louis Luna is a 71 y/o male who presents today for low back pain, rates it as a 10/10. He states yesterday he played golf all day long and woke up with stabbing pains in his right lower back. Denies radiation. Denies urinary symptoms, difficulty ambulating. Took tylenol for pain relief.   Review of Systems  Musculoskeletal:  Positive for back pain.       Objective:  Blood pressure 129/86, pulse 86, height '5\' 8"'$  (1.727 m), weight 227 lb 14.4 oz (103.4 kg), SpO2 98 %.   Physical Exam Vitals reviewed.  Constitutional:      Appearance: He is not ill-appearing.  HENT:     Head: Normocephalic.  Eyes:     Conjunctiva/sclera: Conjunctivae normal.  Cardiovascular:     Rate and Rhythm: Normal rate.  Pulmonary:     Effort: Pulmonary effort is normal. No respiratory distress.  Musculoskeletal:     Lumbar back: No swelling, spasms or tenderness. Positive right straight leg raise test.     Comments: No visible rash.  Neurological:     General: No focal deficit present.     Mental Status: He is alert and oriented to person, place, and time.  Psychiatric:        Mood and Affect: Mood normal.        Behavior: Behavior normal.    No results found for any visits on 01/30/22.      Assessment & Plan:   Problem List Items Addressed This Visit       Other   Acute right-sided low back pain without sciatica - Primary    Discussed taking tylenol for pain relief, ice/heat, stretching. Rx medrol dose pack for strain Return to office if no change in pain       Relevant Medications   methylPREDNISolone (MEDROL DOSEPAK) 4 MG  TBPK tablet   I, Mikey Kirschner, PA-C have reviewed all documentation for this visit. The documentation on  01/30/2022 for the exam, diagnosis, procedures, and orders are all accurate and complete.  Mikey Kirschner, PA-C [facility] [id number] [place], [place], [address] Office: [phone] Fax: [phone]

## 2022-01-30 NOTE — Assessment & Plan Note (Signed)
Discussed taking tylenol for pain relief, ice/heat, stretching. Rx medrol dose pack for strain Return to office if no change in pain

## 2022-03-06 ENCOUNTER — Other Ambulatory Visit: Payer: Self-pay

## 2022-03-06 ENCOUNTER — Telehealth: Payer: Self-pay | Admitting: Cardiology

## 2022-03-06 MED ORDER — ATORVASTATIN CALCIUM 40 MG PO TABS: 40.0000 mg | ORAL_TABLET | Freq: Every day | ORAL | 0 refills | Status: DC

## 2022-03-06 NOTE — Telephone Encounter (Signed)
-----   Message from Horton Finer sent at 03/06/2022 10:32 AM EDT ----- Please schedule 6 month F/U appointment for patient. Thank you!

## 2022-03-06 NOTE — Telephone Encounter (Signed)
LMOV to schedule  

## 2022-04-10 ENCOUNTER — Ambulatory Visit (INDEPENDENT_AMBULATORY_CARE_PROVIDER_SITE_OTHER): Payer: Medicare HMO | Admitting: Family Medicine

## 2022-04-10 ENCOUNTER — Encounter: Payer: Self-pay | Admitting: Family Medicine

## 2022-04-10 VITALS — BP 112/90 | HR 90 | Resp 16 | Wt 221.0 lb

## 2022-04-10 DIAGNOSIS — I4819 Other persistent atrial fibrillation: Secondary | ICD-10-CM | POA: Diagnosis not present

## 2022-04-10 DIAGNOSIS — I10 Essential (primary) hypertension: Secondary | ICD-10-CM

## 2022-04-10 DIAGNOSIS — E7800 Pure hypercholesterolemia, unspecified: Secondary | ICD-10-CM

## 2022-04-10 NOTE — Progress Notes (Signed)
Established patient visit  I,[name],acting as a scribe for [name], MD.,have documented all relevant documentation on the behalf of [name], MD,as directed by  [name], MD while in the presence of [name], MD.   Patient: Louis Luna   DOB: [birth year]   71 y.o. Male  MRN: [id number] Visit Date: 04/10/2022  Today's healthcare provider: [name], MD   Chief Complaint  Patient presents with  . Follow-up  . Hypertension   Subjective    HPI  Hypertension, follow-up  BP Readings from Last 3 Encounters:  04/10/22 (!) 112/90  01/30/22 129/86  10/25/21 (!) 150/108   Wt Readings from Last 3 Encounters:  04/10/22 221 lb (100.2 kg)  01/30/22 227 lb 14.4 oz (103.4 kg)  10/25/21 225 lb (102.1 kg)     He was last seen for hypertension 6 months ago.  BP at that visit was as above. Management since that visit includes none.  Outside blood pressures are {***enter patient reported home BP readings, or 'not being checked':1}.  Pertinent labs Lab Results  Component Value Date   CHOL 142 09/04/2021   HDL 39 (L) 09/04/2021   LDLCALC 77 09/04/2021   TRIG 150 (H) 09/04/2021   CHOLHDL 3.6 09/04/2021   Lab Results  Component Value Date   NA 142 09/19/2021   K 3.7 09/19/2021   CREATININE 1.01 09/19/2021   EGFR 79 09/19/2021   GLUCOSE 103 (H) 09/19/2021   TSH 0.976 07/04/2020     The 10-year ASCVD risk score (Arnett DK, et al., 2019) is: 18.3%  ---------------------------------------------------------------------------------------------------   Medications: Outpatient Medications Prior to Visit  Medication Sig  . amLODipine (NORVASC) 5 MG tablet TAKE 1 TABLET(5 MG) BY MOUTH DAILY  . atenolol (TENORMIN) 50 MG tablet TAKE 1 TABLET BY MOUTH EVERY DAY  . atorvastatin (LIPITOR) 40 MG tablet Take 1 tablet (40 mg total) by mouth daily. PLEASE SCHEDULE OFFICE VISIT FOR FURTHER REFILLS. SECOND ATTEMPT. THANK YOU!  Marland Kitchen  chlorthalidone (HYGROTON) 25 MG tablet TAKE 1 TABLET(25 MG) BY MOUTH EVERY MORNING  . Coenzyme Q10 (CO Q 10) 100 MG CAPS Take 100 mg by mouth daily.  Marland Kitchen Dextromethorphan-guaiFENesin (MUCINEX DM PO) Take 1,200 mg by mouth daily as needed (Congestion).  . diphenhydrAMINE (BENADRYL) 25 MG tablet Take 25 mg by mouth every 6 (six) hours as needed for sleep.  Marland Kitchen docusate sodium (COLACE) 100 MG capsule Take 200 mg by mouth 2 (two) times daily as needed for mild constipation or moderate constipation.  . methylPREDNISolone (MEDROL DOSEPAK) 4 MG TBPK tablet Take 6 pills on day 1, 5 pills on day 2, 4 pills on day 3, 3 pills on day 4, 2 pills on day 5, 1 pill on day 6  . MILK THISTLE PO Take 70 mg by mouth daily. Liver health  . Misc Natural Products (PROSTATE HEALTH PO) Take 250 mg by mouth in the morning and at bedtime. Super Beta Prostate  . Omega-3-6-9 CAPS Take 1 capsule by mouth 2 (two) times daily.  . potassium chloride (KLOR-CON M) 10 MEQ tablet TAKE 1 TABLET(10 MEQ) BY MOUTH TWICE DAILY  . psyllium (METAMUCIL SMOOTH TEXTURE) 28 % packet Take 1 packet by mouth 2 (two) times daily.  . rivaroxaban (XARELTO) 20 MG TABS tablet Take 1 tablet (20 mg total) by mouth daily with supper.  . sertraline (ZOLOFT) 50 MG tablet TAKE 1 TABLET BY MOUTH EVERY DAY  . vitamin B-12 (CYANOCOBALAMIN) 500  MCG tablet Take 500 mcg by mouth daily.  . Vitamin D3 (VITAMIN D) 25 MCG tablet Take 1,000 Units by mouth daily.   No facility-administered medications prior to visit.    Review of Systems  Constitutional:  Negative for appetite change, chills and fever.  Respiratory:  Negative for chest tightness, shortness of breath and wheezing.   Cardiovascular:  Negative for chest pain and palpitations.  Gastrointestinal:  Negative for abdominal pain, nausea and vomiting.    {Labs  Heme  Chem  Endocrine  Serology  Results Review (optional):23779}   Objective    BP (!) 112/90 (BP Location: Left Arm, Patient Position:  Sitting, Cuff Size: Large)   Pulse 90   Resp 16   Wt 221 lb (100.2 kg)   SpO2 96%   BMI 33.60 kg/m  {Show previous vital signs (optional):23777}  Physical Exam  ***  No results found for any visits on 04/10/22.  Assessment & Plan     ***  No follow-ups on file.      {provider attestation***:1}   [name], MD  [facility] [address] [phone] (phone) [phone] (fax)  [place]

## 2022-04-13 ENCOUNTER — Other Ambulatory Visit: Payer: Self-pay | Admitting: *Deleted

## 2022-04-13 MED ORDER — ATORVASTATIN CALCIUM 40 MG PO TABS: 40.0000 mg | ORAL_TABLET | Freq: Every day | ORAL | 0 refills | Status: DC

## 2022-05-01 ENCOUNTER — Other Ambulatory Visit: Payer: Self-pay | Admitting: Cardiology

## 2022-05-01 MED ORDER — ATORVASTATIN CALCIUM 40 MG PO TABS: 40.0000 mg | ORAL_TABLET | Freq: Every day | ORAL | 0 refills | Status: DC

## 2022-05-01 MED ORDER — RIVAROXABAN 20 MG PO TABS: 20.0000 mg | ORAL_TABLET | Freq: Every day | ORAL | 5 refills | Status: DC

## 2022-05-01 NOTE — Telephone Encounter (Signed)
*  STAT* If patient is at the pharmacy, call can be transferred to refill team.   1. Which medications need to be refilled? (please list name of each medication and dose if known) Xarelto 20 mg Atorvastatin 40 mg  2. Which pharmacy/location (including street and city if local pharmacy) is medication to be sent to?[place]  3. Do they need a 30 day or 90 day supply? [place]

## 2022-05-01 NOTE — Telephone Encounter (Signed)
Pt last saw Dr Quentin Ore 10/25/21, last labs 09/19/21 Creat 1.01, age 71, weight 100.2kg, CrCl 93.7, based on CrCl pt is on appropriate dosage of Xarelto '20mg'$  QD for afib.  Will refill rx.

## 2022-05-01 NOTE — Telephone Encounter (Signed)
Refill Request.  

## 2022-05-25 ENCOUNTER — Other Ambulatory Visit: Payer: Self-pay | Admitting: Family Medicine

## 2022-05-26 ENCOUNTER — Other Ambulatory Visit: Payer: Self-pay | Admitting: Family Medicine

## 2022-05-26 DIAGNOSIS — F32A Depression, unspecified: Secondary | ICD-10-CM

## 2022-05-28 ENCOUNTER — Other Ambulatory Visit: Payer: Self-pay

## 2022-05-28 DIAGNOSIS — I10 Essential (primary) hypertension: Secondary | ICD-10-CM

## 2022-05-28 NOTE — Telephone Encounter (Signed)
Requested medication (s) are due for refill today: yes  Requested medication (s) are on the active medication list: yes  Last refill:    Future visit scheduled:yes, with Dr. Rosanna Randy  Notes to clinic:  Unable to refill per protocol, last refill by provider 05/25/22 for 90 and 3 RF. Request too soon. Zoloft last RF was 07/21/21 it is due for RF. Provider is no longer at practice, routing for approval.     Requested Prescriptions  Pending Prescriptions Disp Refills   atenolol (TENORMIN) 50 MG tablet [Pharmacy Med Name: ATENOLOL '50MG'$  TABLETS] 90 tablet 3    Sig: TAKE 1 TABLET BY MOUTH EVERY DAY     Cardiovascular: Beta Blockers 2 Failed - 05/26/2022  6:33 AM      Failed - Last BP in normal range    BP Readings from Last 1 Encounters:  04/10/22 (!) 112/90         Passed - Cr in normal range and within 360 days    Creatinine, Ser  Date Value Ref Range Status  09/19/2021 1.01 0.76 - 1.27 mg/dL Final         Passed - Last Heart Rate in normal range    Pulse Readings from Last 1 Encounters:  04/10/22 90         Passed - Valid encounter within last 6 months    Recent Outpatient Visits           1 month ago Primary hypertension   [facility] Jerrol Banana., MD   3 months ago Acute right-sided low back pain without sciatica   [facility] Mikey Kirschner, PA-C   7 months ago Encounter for [employer] annual wellness exam   [facility] Jerrol Banana., MD   1 year ago Right inguinal hernia   [facility] Jerrol Banana., MD   1 year ago Acute low back pain with sciatica, sciatica laterality unspecified, unspecified back pain laterality   [facility] Jerrol Banana., MD       Future Appointments             Tomorrow Gerrie Nordmann, NP [place] [facility] [place] [place]   In 4 months Jerrol Banana., MD [facility]              sertraline (ZOLOFT) 50 MG tablet [Pharmacy Med Name: SERTRALINE '50MG'$  TABLETS] 90 tablet 3    Sig: TAKE 1 TABLET BY MOUTH EVERY DAY     Psychiatry:  Antidepressants - SSRI - sertraline Failed - 05/26/2022  6:33 AM      Failed - AST in normal range and within 360 days    AST  Date Value Ref Range Status  05/26/2021 25 15 - 41 U/L Final         Failed - ALT in normal range and within 360 days    ALT  Date Value Ref Range Status  05/26/2021 21 0 - 44 U/L Final         Passed - Completed PHQ-2 or PHQ-9 in the last 360 days      Passed - Valid encounter within last 6 months    Recent Outpatient Visits           1 month ago Primary hypertension   [facility] Jerrol Banana., MD   3 months ago Acute right-sided low back pain without sciatica  [facility] Mikey Kirschner, PA-C   7 months ago Encounter for [employer] annual wellness exam   [facility] Jerrol Banana., MD   1 year ago Right inguinal hernia   [facility] Jerrol Banana., MD   1 year ago Acute low back pain with sciatica, sciatica laterality unspecified, unspecified back pain laterality   [facility] Jerrol Banana., MD       Future Appointments             Tomorrow Gerrie Nordmann, NP [facility] [place]. [facility]   In 4 months Jerrol Banana., MD [facility] [place]

## 2022-05-29 ENCOUNTER — Encounter: Payer: Self-pay | Admitting: Cardiology

## 2022-05-29 ENCOUNTER — Ambulatory Visit: Payer: Medicare HMO | Attending: Cardiology | Admitting: Cardiology

## 2022-05-29 VITALS — BP 138/90 | HR 96 | Ht 68.0 in | Wt 219.6 lb

## 2022-05-29 DIAGNOSIS — I10 Essential (primary) hypertension: Secondary | ICD-10-CM | POA: Diagnosis not present

## 2022-05-29 DIAGNOSIS — I4819 Other persistent atrial fibrillation: Secondary | ICD-10-CM

## 2022-05-29 DIAGNOSIS — E7800 Pure hypercholesterolemia, unspecified: Secondary | ICD-10-CM | POA: Diagnosis not present

## 2022-05-29 MED ORDER — ATORVASTATIN CALCIUM 40 MG PO TABS: 40.0000 mg | ORAL_TABLET | Freq: Every day | ORAL | 3 refills | Status: DC

## 2022-05-29 NOTE — Patient Instructions (Signed)
Medication Instructions:  Your Physician recommend you continue on your current medication as directed.    *If you need a refill on your cardiac medications before your next appointment, please call your pharmacy*   Lab Work: None ordered today   Testing/Procedures: None ordered today   Follow-Up: At [facility], you and your health needs are our priority.  As part of our continuing mission to provide you with exceptional heart care, we have created designated Provider Care Teams.  These Care Teams include your primary Cardiologist (physician) and Advanced Practice Providers (APPs -  Physician Assistants and Nurse Practitioners) who all work together to provide you with the care you need, when you need it.  We recommend signing up for the patient portal called "MyChart".  Sign up information is provided on this After Visit Summary.  MyChart is used to connect with patients for Virtual Visits (Telemedicine).  Patients are able to view lab/test results, encounter notes, upcoming appointments, etc.  Non-urgent messages can be sent to your provider as well.   To learn more about what you can do with MyChart, go to [email or web address].    Your next appointment:   6 month(s)  The format for your next appointment:   In Person  Provider:   Kate Sable, MD

## 2022-05-29 NOTE — Progress Notes (Signed)
Cardiology Clinic Note   Patient Name: Louis Luna Date of Encounter: 05/29/2022  Primary Care Provider:  Jerrol Luna., MD Primary Cardiologist:  Louis Sable, MD  Patient Profile    71 year old male with a history of persistent atrial fibrillation status post DCCV 08/2021, hypertension, hyperlipidemia, and former smoker x20+ years who presents today for follow-up on his paroxysmal atrial fibrillation and hypertension.  Past Medical History    Past Medical History:  Diagnosis Date   Depression    GERD (gastroesophageal reflux disease)    H/O elevated lipids    HLD (hyperlipidemia)    Hypertension    Personal history of tobacco use, presenting hazards to health 01/26/2016   Sleep apnea    Past Surgical History:  Procedure Laterality Date   ANKLE SURGERY     APPENDECTOMY     CARDIOVERSION N/A 09/11/2021   Procedure: CARDIOVERSION;  Surgeon: Louis Sable, MD;  Location: [place] [facility];  Service: Cardiovascular;  Laterality: N/A;   COLONOSCOPY N/A 03/23/2016   Procedure: COLONOSCOPY;  Surgeon: Louis Silvas, MD;  Location: [place];  Service: Endoscopy;  Laterality: N/A;   HERNIA REPAIR     inguinal-left 04/1979   QUADRICEPS TENDON REPAIR Left 05/05/2015   Procedure: REPAIR QUADRICEP TENDON;  Surgeon: Louis Knows, MD;  Location: [facility];  Service: Orthopedics;  Laterality: Left;   TUMOR EXCISION     from Rt side of the neck-benign    Allergies  No Known Allergies  History of Present Illness    Louis Luna is a 71 year old male with a past medical history of persistent atrial fibrillation status post DCCV 08/2021, hypertension, hyperlipidemia, former smoker x20+ years.  Patient recently diagnosed with inguinal hernia, surgical management was being planned.  Preoperative evaluation EKG done 05/16/2021 revealed new onset atrial fibrillation with ST-T wave abnormalities.  At that time he denied chest pain, shortness of breath, palpitations, or  dizziness.  Denies any history of heart disease, denies strokes, denies CAD or peripheral arterial disease.  At that time his surgical intervention for inguinal hernia repair was rescheduled pending cardiac work-up.  Echocardiogram done 05/2021 revealed no gross structural abnormalities, EF of 50-55%, and mildly dilated left atrium.  He was last seen in clinic by Dr. Quentin Luna on 10/25/2021 status post cardioversion September 11, 2021.  He is continued on Xarelto for stroke prophylaxis.  He did confirm that he was asymptomatic with atrial fibrillation.  Recommendation after seeing Dr. Quentin Luna was to continue with focusing on rate control.  He returns to clinic today stating that he has been doing well.  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  He denies any chest pain, shortness of breath, palpitations, or peripheral edema.  Denies any hospitalizations or visits to the emergency department.  Continues to deny any symptoms related to his atrial fibrillation.   Home Medications    Current Outpatient Medications  Medication Sig Dispense Refill   amLODipine (NORVASC) 5 MG tablet TAKE 1 TABLET(5 MG) BY MOUTH DAILY 30 tablet 0   atenolol (TENORMIN) 50 MG tablet TAKE 1 TABLET BY MOUTH EVERY DAY 90 tablet 3   chlorthalidone (HYGROTON) 25 MG tablet TAKE 1 TABLET(25 MG) BY MOUTH EVERY MORNING 90 tablet 3   Coenzyme Q10 (CO Q 10) 100 MG CAPS Take 100 mg by mouth daily.     Dextromethorphan-guaiFENesin (MUCINEX DM PO) Take 1,200 mg by mouth daily as  needed (Congestion).     diphenhydrAMINE (BENADRYL) 25 MG tablet Take 25 mg by mouth every 6 (six) hours as needed for sleep.     docusate sodium (COLACE) 100 MG capsule Take 200 mg by mouth 2 (two) times daily as needed for mild constipation or moderate constipation.     methylPREDNISolone (MEDROL DOSEPAK) 4 MG TBPK tablet Take 6 pills on day 1, 5 pills on day 2, 4 pills on day 3, 3 pills on day 4,  2 pills on day 5, 1 pill on day 6 1 each 0   MILK THISTLE PO Take 70 mg by mouth daily. Liver health     Misc Natural Products (PROSTATE HEALTH PO) Take 250 mg by mouth in the morning and at bedtime. Super Beta Prostate     Omega-3-6-9 CAPS Take 1 capsule by mouth 2 (two) times daily.     potassium chloride (KLOR-CON M) 10 MEQ tablet TAKE 1 TABLET(10 MEQ) BY MOUTH TWICE DAILY 60 tablet 5   psyllium (METAMUCIL SMOOTH TEXTURE) 28 % packet Take 1 packet by mouth 2 (two) times daily.     rivaroxaban (XARELTO) 20 MG TABS tablet Take 1 tablet (20 mg total) by mouth daily with supper. 30 tablet 5   sertraline (ZOLOFT) 50 MG tablet TAKE 1 TABLET BY MOUTH EVERY DAY 90 tablet 3   vitamin B-12 (CYANOCOBALAMIN) 500 MCG tablet Take 500 mcg by mouth daily.     Vitamin D3 (VITAMIN D) 25 MCG tablet Take 1,000 Units by mouth daily.     atorvastatin (LIPITOR) 40 MG tablet Take 1 tablet (40 mg total) by mouth daily. 90 tablet 3   No current facility-administered medications for this visit.     Family History    Family History  Problem Relation Age of Onset   Diabetes Mother    Hypertension Mother    Breast cancer Mother    Alcohol abuse Father    Hypertension Father    Alzheimer's disease Father    Alcohol abuse Brother    Hypertension Brother    Breast cancer Sister    Melanoma Sister    Depression Sister    Breast cancer Sister    Depression Sister    Hypertension Brother    He indicated that his mother is deceased. He indicated that his father is deceased. He indicated that three of his four sisters are alive. He indicated that both of his brothers are alive. He indicated that his daughter is alive. He indicated that his son is deceased.  Social History    Social History   Socioeconomic History   Marital status: Married    Spouse name: Louis Luna   Number of children: 2   Years of education: 18   Highest education level: Not on file  Occupational History   Occupation: retired  Tobacco Use    Smoking status: Former    Packs/day: 1.00    Years: 30.00    Total pack years: 30.00    Types: Cigarettes    Quit date: 10/26/2011    Years since quitting: 10.5   Smokeless tobacco: Never  Vaping Use   Vaping Use: Never used  Substance and Sexual Activity   Alcohol use: Yes    Comment: ocassionally-beer   Drug use: No   Sexual activity: Never    Birth control/protection: None  Other Topics Concern   Not on file  Social History Narrative   Not on file   Social Determinants of Radio broadcast assistant  Strain: Not on file  Food Insecurity: Not on file  Transportation Needs: Not on file  Physical Activity: Not on file  Stress: Not on file  Social Connections: Not on file  Intimate Partner Violence: Not on file     Review of Systems    General:  No chills, fever, night sweats or weight changes.  Cardiovascular:  No chest pain, dyspnea on exertion, edema, orthopnea, palpitations, paroxysmal nocturnal dyspnea. Dermatological: No rash, lesions/masses Respiratory: No cough, dyspnea Urologic: No hematuria, dysuria Abdominal:   No nausea, vomiting, diarrhea, bright red blood per rectum, melena, or hematemesis Neurologic:  No visual changes, wkns, changes in mental status. All other systems reviewed and are otherwise negative except as noted above.     Physical Exam    VS:  BP (!) 138/90   Pulse 96   Ht '5\' 8"'$  (1.727 m)   Wt 219 lb 9.6 oz (99.6 kg)   SpO2 95%   BMI 33.39 kg/m  , BMI Body mass index is 33.39 kg/m.     GEN: Well nourished, well developed, in no acute distress. HEENT: normal. Neck: Supple, no JVD, carotid bruits, or masses. Cardiac: Irregularly irregular  , no murmurs, rubs, or gallops. No clubbing, cyanosis, edema.  Radials/DP/PT 2+ and equal bilaterally.  Respiratory:  Respirations regular and unlabored, clear to auscultation bilaterally. GI: Soft, nontender, nondistended, BS + x 4. MS: no deformity or atrophy. Skin: warm and dry, no rash. Neuro:   Strength and sensation are intact. Psych: Normal affect.  Accessory Clinical Findings    ECG personally reviewed by me today-rate controlled atrial fibrillation, LVH,- No acute changes  Lab Results  Component Value Date   WBC 6.3 09/04/2021   HGB 15.8 09/04/2021   HCT 45.5 09/04/2021   MCV 91 09/04/2021   PLT 167 09/04/2021   Lab Results  Component Value Date   CREATININE 1.01 09/19/2021   BUN 13 09/19/2021   NA 142 09/19/2021   K 3.7 09/19/2021   CL 100 09/19/2021   CO2 27 09/19/2021   Lab Results  Component Value Date   ALT 21 05/26/2021   AST 25 05/26/2021   ALKPHOS 91 05/26/2021   BILITOT 1.4 (H) 05/26/2021   Lab Results  Component Value Date   CHOL 142 09/04/2021   HDL 39 (L) 09/04/2021   LDLCALC 77 09/04/2021   TRIG 150 (H) 09/04/2021   CHOLHDL 3.6 09/04/2021    No results found for: "HGBA1C"  Assessment & Plan   1.  Persistent atrial fibrillation that is rate controlled.  Patient remains asymptomatic to atrial fibrillation.  He is continued on atenolol for rate control rivaroxaban for stroke prophylaxis.  He has a CHA2DS2-VASc score of at least 2 (age, hypertension).  Echocardiogram revealed preserved EF.  He can also keep his as needed appointments with the EP.  2.  Hypertension blood pressure 138/90 recheck of 130/82 today.  He is to continue on amlodipine 5 mg daily chlorthalidone 25 mg daily.  He is also been advised to continue to monitor his pressure at home.  Also encouraged to decrease sodium intake.  3.  Hyperlipidemia with LDL of 77/9/23.  He is continued on his atorvastatin 40 mg daily.  He is requesting a refill of atorvastatin today 90-day supply sent to the [facility] in [address] per his request.  4.  Disposition patient return to clinic to see MD/APP in 6 months or sooner if needed.  Rashiya Lofland, NP 05/29/2022, 4:21 PM

## 2022-07-23 ENCOUNTER — Telehealth: Payer: Self-pay | Admitting: Family Medicine

## 2022-07-23 DIAGNOSIS — E876 Hypokalemia: Secondary | ICD-10-CM

## 2022-07-23 MED ORDER — POTASSIUM CHLORIDE CRYS ER 10 MEQ PO TBCR: EXTENDED_RELEASE_TABLET | ORAL | 0 refills | Status: DC

## 2022-07-23 NOTE — Telephone Encounter (Signed)
Done

## 2022-07-23 NOTE — Telephone Encounter (Signed)
[place] faxed refill request for the following medications:   potassium chloride (KLOR-CON M) 10 MEQ tablet   Please advise

## 2022-08-17 ENCOUNTER — Encounter: Payer: Self-pay | Admitting: Family Medicine

## 2022-08-17 ENCOUNTER — Ambulatory Visit (INDEPENDENT_AMBULATORY_CARE_PROVIDER_SITE_OTHER): Payer: Medicare HMO | Admitting: Family Medicine

## 2022-08-17 VITALS — BP 127/82 | HR 97 | Temp 97.8°F | Wt 231.0 lb

## 2022-08-17 DIAGNOSIS — J398 Other specified diseases of upper respiratory tract: Secondary | ICD-10-CM | POA: Insufficient documentation

## 2022-08-17 DIAGNOSIS — J069 Acute upper respiratory infection, unspecified: Secondary | ICD-10-CM | POA: Diagnosis not present

## 2022-08-17 LAB — POCT INFLUENZA A/B
Influenza A, POC: NEGATIVE
Influenza B, POC: NEGATIVE

## 2022-08-17 LAB — POC COVID19 BINAXNOW: SARS Coronavirus 2 Ag: NEGATIVE

## 2022-08-17 MED ORDER — GUAIFENESIN-CODEINE 100-10 MG/5ML PO SOLN: 10.0000 mL | Freq: Every evening | ORAL | 0 refills | Status: DC | PRN

## 2022-08-17 NOTE — Assessment & Plan Note (Signed)
Symptoms x 3 days; productive cough

## 2022-08-17 NOTE — Progress Notes (Signed)
I,Connie R Striblin,acting as a Education administrator for Gwyneth Sprout, FNP.,have documented all relevant documentation on the behalf of Gwyneth Sprout, FNP,as directed by  Gwyneth Sprout, FNP while in the presence of Gwyneth Sprout, FNP.  Established patient visit  Patient: Louis Luna   DOB: 1951-03-05   71 y.o. Male  MRN: [id number] Visit Date: 08/17/2022  Today's healthcare provider: Gwyneth Sprout, FNP  Patient presents for new patient visit to establish care.  Introduced to Designer, jewellery role and practice setting.  All questions answered.  Discussed provider/patient relationship and expectations.  Subjective    HPI  Cough: Patient complains of cough. Symptoms began 3 days ago. Cough described as productive of green/yellow sputum. Patient denies fever. Associated symptoms include nasal congestion, rhinorrhea  , and sweats. Patient denies sore throat and wheezing.  Current treatments have included  OTC cough medication , with fair improvement.    Medications: Outpatient Medications Prior to Visit  Medication Sig   amLODipine (NORVASC) 5 MG tablet TAKE 1 TABLET(5 MG) BY MOUTH DAILY   atenolol (TENORMIN) 50 MG tablet TAKE 1 TABLET BY MOUTH EVERY DAY   atorvastatin (LIPITOR) 40 MG tablet Take 1 tablet (40 mg total) by mouth daily.   chlorthalidone (HYGROTON) 25 MG tablet TAKE 1 TABLET(25 MG) BY MOUTH EVERY MORNING   Coenzyme Q10 (CO Q 10) 100 MG CAPS Take 100 mg by mouth daily.   Dextromethorphan-guaiFENesin (MUCINEX DM PO) Take 1,200 mg by mouth daily as needed (Congestion).   diphenhydrAMINE (BENADRYL) 25 MG tablet Take 25 mg by mouth every 6 (six) hours as needed for sleep.   docusate sodium (COLACE) 100 MG capsule Take 200 mg by mouth 2 (two) times daily as needed for mild constipation or moderate constipation.   methylPREDNISolone (MEDROL DOSEPAK) 4 MG TBPK tablet Take 6 pills on day 1, 5 pills on day 2, 4 pills on day 3, 3 pills on day 4, 2 pills on day 5, 1 pill on day 6   MILK THISTLE  PO Take 70 mg by mouth daily. Liver health   Misc Natural Products (PROSTATE HEALTH PO) Take 250 mg by mouth in the morning and at bedtime. Super Beta Prostate   Omega-3-6-9 CAPS Take 1 capsule by mouth 2 (two) times daily.   potassium chloride (KLOR-CON M) 10 MEQ tablet TAKE 1 TABLET(10 MEQ) BY MOUTH TWICE DAILY   psyllium (METAMUCIL SMOOTH TEXTURE) 28 % packet Take 1 packet by mouth 2 (two) times daily.   rivaroxaban (XARELTO) 20 MG TABS tablet Take 1 tablet (20 mg total) by mouth daily with supper.   sertraline (ZOLOFT) 50 MG tablet TAKE 1 TABLET BY MOUTH EVERY DAY   vitamin B-12 (CYANOCOBALAMIN) 500 MCG tablet Take 500 mcg by mouth daily.   Vitamin D3 (VITAMIN D) 25 MCG tablet Take 1,000 Units by mouth daily.   No facility-administered medications prior to visit.   Review of Systems    Objective    BP 127/82 (BP Location: Left Arm, Patient Position: Sitting, Cuff Size: Large)   Pulse 97   Temp 97.8 F (36.6 C) (Oral)   Wt 231 lb (104.8 kg)   SpO2 98%   BMI 35.12 kg/m   Physical Exam Vitals and nursing note reviewed.  Constitutional:      General: He is not in acute distress.    Appearance: Normal appearance. He is obese. He is not ill-appearing, toxic-appearing or diaphoretic.  HENT:     Head: Normocephalic and atraumatic.  Right Ear: Tympanic membrane, ear canal and external ear normal. There is no impacted cerumen.     Left Ear: Tympanic membrane, ear canal and external ear normal. There is no impacted cerumen.     Nose: Congestion present.     Right Sinus: No maxillary sinus tenderness or frontal sinus tenderness.     Left Sinus: No maxillary sinus tenderness or frontal sinus tenderness.     Mouth/Throat:     Mouth: Mucous membranes are moist.     Pharynx: Oropharynx is clear. No oropharyngeal exudate or posterior oropharyngeal erythema.  Eyes:     Pupils: Pupils are equal, round, and reactive to light.  Cardiovascular:     Rate and Rhythm: Normal rate and regular  rhythm.     Pulses: Normal pulses.     Heart sounds: Normal heart sounds.  Pulmonary:     Effort: Pulmonary effort is normal. No respiratory distress.     Breath sounds: Normal breath sounds. No stridor. No wheezing, rhonchi or rales.  Chest:     Chest wall: No tenderness.  Musculoskeletal:        General: Normal range of motion.     Cervical back: Normal range of motion.  Skin:    General: Skin is warm and dry.     Capillary Refill: Capillary refill takes less than 2 seconds.  Neurological:     General: No focal deficit present.     Mental Status: He is alert and oriented to person, place, and time. Mental status is at baseline.  Psychiatric:        Mood and Affect: Mood normal.        Behavior: Behavior normal.        Thought Content: Thought content normal.        Judgment: Judgment normal.     Results for orders placed or performed in visit on 08/17/22  POC COVID-19  Result Value Ref Range   SARS Coronavirus 2 Ag Negative Negative  POCT Influenza A/B  Result Value Ref Range   Influenza A, POC Negative Negative   Influenza B, POC Negative Negative    Assessment & Plan     Problem List Items Addressed This Visit       Respiratory   Congestion of upper respiratory tract - Primary    Symptoms x 3 days; productive cough       Relevant Orders   POC COVID-19 (Completed)   POCT Influenza A/B (Completed)   Viral URI with cough    Negative COVID and Flu; continue supportive care with Rx cough medication to assist Continue PO intake and bland diet       Return if symptoms worsen or fail to improve.     Vonna Kotyk, FNP, have reviewed all documentation for this visit. The documentation on 08/17/22 for the exam, diagnosis, procedures, and orders are all accurate and complete.  Gwyneth Sprout, [place] [phone] (phone) [phone] (fax)  [place]

## 2022-08-17 NOTE — Assessment & Plan Note (Signed)
Negative COVID and Flu; continue supportive care with Rx cough medication to assist Continue PO intake and bland diet

## 2022-08-19 ENCOUNTER — Other Ambulatory Visit: Payer: Self-pay | Admitting: Family Medicine

## 2022-08-19 DIAGNOSIS — E876 Hypokalemia: Secondary | ICD-10-CM

## 2022-08-19 DIAGNOSIS — I10 Essential (primary) hypertension: Secondary | ICD-10-CM

## 2022-08-23 NOTE — Telephone Encounter (Signed)
Requested medication (s) are due for refill today: yes  Requested medication (s) are on the active medication list: yes  Last refill:  08/30/21 #90 3 RF  Future visit scheduled: yes  Notes to clinic:  overdue labs   Requested Prescriptions  Pending Prescriptions Disp Refills   chlorthalidone (HYGROTON) 25 MG tablet [Pharmacy Med Name: CHLORTHALIDONE '25MG'$  TABLETS] 90 tablet     Sig: TAKE 1 TABLET(25 MG) BY MOUTH EVERY MORNING     Cardiovascular: Diuretics - Thiazide Failed - 08/19/2022  6:42 AM      Failed - Cr in normal range and within 180 days    Creatinine, Ser  Date Value Ref Range Status  09/19/2021 1.01 0.76 - 1.27 mg/dL Final         Failed - K in normal range and within 180 days    Potassium  Date Value Ref Range Status  09/19/2021 3.7 3.5 - 5.2 mmol/L Final         Failed - Na in normal range and within 180 days    Sodium  Date Value Ref Range Status  09/19/2021 142 134 - 144 mmol/L Final         Passed - Last BP in normal range    BP Readings from Last 1 Encounters:  08/17/22 127/82         Passed - Valid encounter within last 6 months    Recent Outpatient Visits           6 days ago Congestion of upper respiratory tract   [facility] Gwyneth Sprout, FNP   4 months ago Primary hypertension   [facility] Jerrol Banana., MD   6 months ago Acute right-sided low back pain without sciatica   [facility] Mikey Kirschner, PA-C   10 months ago Encounter for [employer] annual wellness exam   [facility] Jerrol Banana., MD   1 year ago Right inguinal hernia   [facility] Jerrol Banana., MD       Future Appointments             In 1 month Gwyneth Sprout, [place], [place]   In 1 month Jerrol Banana., MD [facility], [place]   In 2 months Fisher, Kirstie Peri, MD [facility], [place]   In 3 months Kate Sable,  MD [place]. [facility]            Signed Prescriptions Disp Refills   potassium chloride (KLOR-CON M) 10 MEQ tablet 180 tablet 0    Sig: TAKE 1 TABLET(10 MEQ) BY MOUTH TWICE DAILY     Endocrinology:  Minerals - Potassium Supplementation Passed - 08/19/2022  6:42 AM      Passed - K in normal range and within 360 days    Potassium  Date Value Ref Range Status  09/19/2021 3.7 3.5 - 5.2 mmol/L Final         Passed - Cr in normal range and within 360 days    Creatinine, Ser  Date Value Ref Range Status  09/19/2021 1.01 0.76 - 1.27 mg/dL Final         Passed - Valid encounter within last 12 months    Recent Outpatient Visits           6 days ago Congestion of upper respiratory tract   [facility] Tally Joe  T, FNP   4 months ago Primary hypertension   [facility] Jerrol Banana., MD   6 months ago Acute right-sided low back pain without sciatica   [facility] Mikey Kirschner, PA-C   10 months ago Encounter for [employer] annual wellness exam   [facility] Jerrol Banana., MD   1 year ago Right inguinal hernia   [facility] Jerrol Banana., MD       Future Appointments             In 1 month Gwyneth Sprout, [place], [place]   In 1 month Jerrol Banana., MD [facility], [place]   In 2 months Caryn Section, Kirstie Peri, MD [facility], [place]   In 3 months Kate Sable, MD [place]. [place]

## 2022-08-23 NOTE — Telephone Encounter (Signed)
Requested Prescriptions  Pending Prescriptions Disp Refills   chlorthalidone (HYGROTON) 25 MG tablet [Pharmacy Med Name: CHLORTHALIDONE '25MG'$  TABLETS] 90 tablet     Sig: TAKE 1 TABLET(25 MG) BY MOUTH EVERY MORNING     Cardiovascular: Diuretics - Thiazide Failed - 08/19/2022  6:42 AM      Failed - Cr in normal range and within 180 days    Creatinine, Ser  Date Value Ref Range Status  09/19/2021 1.01 0.76 - 1.27 mg/dL Final         Failed - K in normal range and within 180 days    Potassium  Date Value Ref Range Status  09/19/2021 3.7 3.5 - 5.2 mmol/L Final         Failed - Na in normal range and within 180 days    Sodium  Date Value Ref Range Status  09/19/2021 142 134 - 144 mmol/L Final         Passed - Last BP in normal range    BP Readings from Last 1 Encounters:  08/17/22 127/82         Passed - Valid encounter within last 6 months    Recent Outpatient Visits           6 days ago Congestion of upper respiratory tract   [facility] Gwyneth Sprout, FNP   4 months ago Primary hypertension   [facility] Jerrol Banana., MD   6 months ago Acute right-sided low back pain without sciatica   [facility] Mikey Kirschner, PA-C   10 months ago Encounter for [employer] annual wellness exam   [facility] Jerrol Banana., MD   1 year ago Right inguinal hernia   [facility] Jerrol Banana., MD       Future Appointments             In 1 month Gwyneth Sprout, [place], [place]   In 1 month Jerrol Banana., MD [facility], [place]   In 2 months Fisher, Kirstie Peri, MD [facility], [place]   In 3 months Kate Sable, MD [place]. [facility]             potassium chloride (KLOR-CON M) 10 MEQ tablet [Pharmacy Med Name: POTASSIUM CL MICRO 10MEQ ER TABS] 180 tablet 0    Sig: TAKE 1 TABLET(10  MEQ) BY MOUTH TWICE DAILY     Endocrinology:  Minerals - Potassium Supplementation Passed - 08/19/2022  6:42 AM      Passed - K in normal range and within 360 days    Potassium  Date Value Ref Range Status  09/19/2021 3.7 3.5 - 5.2 mmol/L Final         Passed - Cr in normal range and within 360 days    Creatinine, Ser  Date Value Ref Range Status  09/19/2021 1.01 0.76 - 1.27 mg/dL Final         Passed - Valid encounter within last 12 months    Recent Outpatient Visits           6 days ago Congestion of upper respiratory tract   [facility] Gwyneth Sprout, FNP   4 months ago Primary hypertension   [facility] Jerrol Banana., MD   6 months ago Acute right-sided low back pain without sciatica   [facility] Mikey Kirschner,  PA-C   10 months ago Encounter for [employer] annual wellness exam   [facility] Jerrol Banana., MD   1 year ago Right inguinal hernia   [facility] Jerrol Banana., MD       Future Appointments             In 1 month Gwyneth Sprout, [place], [place]   In 1 month Jerrol Banana., MD [facility], [place]   In 2 months Caryn Section, Kirstie Peri, MD [facility], [place]   In 3 months Kate Sable, MD [place]. [place]

## 2022-09-14 ENCOUNTER — Other Ambulatory Visit: Payer: Self-pay | Admitting: Family Medicine

## 2022-09-14 ENCOUNTER — Encounter: Payer: Self-pay | Admitting: Family Medicine

## 2022-10-03 ENCOUNTER — Telehealth: Payer: Self-pay

## 2022-10-03 NOTE — Telephone Encounter (Signed)
Received fax for prescription for CPAP supplies from [employer]. Faxed prescription, office notes and sleep study report. [phone].

## 2022-10-09 ENCOUNTER — Telehealth: Payer: Self-pay

## 2022-10-09 NOTE — Telephone Encounter (Signed)
Called to patient to fu on CPAP supply order. Wife reports [place] has not reached out to them.   I called [employer], Gae Bon reports they have not received the order with notes. I requested another fax number. Faxed to [phone].  Phone number for [employer] [phone].

## 2022-10-16 NOTE — Progress Notes (Unsigned)
I,Takeisha Cianci R Louise Victory,acting as a Education administrator for Gwyneth Sprout, FNP.,have documented all relevant documentation on the behalf of Gwyneth Sprout, FNP,as directed by  Gwyneth Sprout, FNP while in the presence of Gwyneth Sprout, FNP.  Annual Wellness Visit  Patient: Louis Luna, Male    DOB: Apr 20, 1951, 72 y.o.   MRN: [id number] Visit Date: 10/17/2022  Today's Provider: Gwyneth Sprout, FNP  Re Introduced to nurse practitioner role and practice setting.  All questions answered.  Discussed provider/patient relationship and expectations.  Chief Complaint  Patient presents with   Annual Exam   Subjective    Louis Luna is a 72 y.o. male who presents today for his Annual Wellness Visit. He reports consuming a general diet. The patient has a physically strenuous job, but has no regular exercise apart from work.  He generally feels well. He reports sleeping fairly well. He does not have additional problems to discuss today.   HPI Patient declined all due vaccines.   Medications: Outpatient Medications Prior to Visit  Medication Sig   atorvastatin (LIPITOR) 40 MG tablet Take 1 tablet (40 mg total) by mouth daily.   calcium-vitamin D (OSCAL WITH D) 500-5 MG-MCG tablet Take 1 tablet by mouth daily with breakfast.   Coenzyme Q10 (CO Q 10) 100 MG CAPS Take 100 mg by mouth daily.   diphenhydrAMINE (BENADRYL) 25 MG tablet Take 25 mg by mouth every 6 (six) hours as needed for sleep.   docusate sodium (COLACE) 100 MG capsule Take 200 mg by mouth 2 (two) times daily as needed for mild constipation or moderate constipation.   MILK THISTLE PO Take 70 mg by mouth daily. Liver health   Misc Natural Products (PROSTATE HEALTH PO) Take 250 mg by mouth in the morning and at bedtime. Super Beta Prostate   Omega-3-6-9 CAPS Take 1 capsule by mouth 2 (two) times daily.   psyllium (METAMUCIL SMOOTH TEXTURE) 28 % packet Take 1 packet by mouth 2 (two) times daily.   rivaroxaban (XARELTO) 20 MG TABS tablet Take 1  tablet (20 mg total) by mouth daily with supper.   sertraline (ZOLOFT) 50 MG tablet TAKE 1 TABLET BY MOUTH EVERY DAY   vitamin B-12 (CYANOCOBALAMIN) 500 MCG tablet Take 500 mcg by mouth daily.   Vitamin D3 (VITAMIN D) 25 MCG tablet Take 1,000 Units by mouth daily.   [DISCONTINUED] amLODipine (NORVASC) 5 MG tablet TAKE 1 TABLET(5 MG) BY MOUTH DAILY   [DISCONTINUED] atenolol (TENORMIN) 50 MG tablet TAKE 1 TABLET BY MOUTH EVERY DAY   [DISCONTINUED] chlorthalidone (HYGROTON) 25 MG tablet TAKE 1 TABLET(25 MG) BY MOUTH EVERY MORNING   [DISCONTINUED] Dextromethorphan-guaiFENesin (MUCINEX DM PO) Take 1,200 mg by mouth daily as needed (Congestion).   [DISCONTINUED] guaiFENesin-codeine 100-10 MG/5ML syrup Take 10 mLs by mouth at bedtime and may repeat dose one time if needed.   [DISCONTINUED] methylPREDNISolone (MEDROL DOSEPAK) 4 MG TBPK tablet Take 6 pills on day 1, 5 pills on day 2, 4 pills on day 3, 3 pills on day 4, 2 pills on day 5, 1 pill on day 6   [DISCONTINUED] potassium chloride (KLOR-CON M) 10 MEQ tablet TAKE 1 TABLET(10 MEQ) BY MOUTH TWICE DAILY   No facility-administered medications prior to visit.    No Known Allergies  Patient Care Team: Gwyneth Sprout, FNP as PCP - General (Family Medicine) Kate Sable, MD as PCP - Cardiology (Cardiology) Vickie Epley, MD as PCP - Electrophysiology (Cardiology) Estill Cotta, MD as Consulting  Physician (Ophthalmology)  Review of Systems      Objective    Vitals: BP 139/88 (BP Location: Right Arm, Patient Position: Sitting, Cuff Size: Large)   Pulse 100   Temp 97.9 F (36.6 C) (Oral)   Ht 5' 8"$  (1.727 m)   Wt 235 lb 12.8 oz (107 kg)   SpO2 98%   BMI 35.85 kg/m    Physical Exam Vitals and nursing note reviewed.  Constitutional:      General: He is awake. He is not in acute distress.    Appearance: Normal appearance. He is well-developed and well-groomed. He is obese. He is not ill-appearing, toxic-appearing or  diaphoretic.  HENT:     Head: Normocephalic and atraumatic.     Jaw: There is normal jaw occlusion. No trismus, tenderness, swelling or pain on movement.     Salivary Glands: Right salivary gland is not diffusely enlarged or tender. Left salivary gland is not diffusely enlarged or tender.     Right Ear: Hearing, tympanic membrane, ear canal and external ear normal. There is no impacted cerumen.     Left Ear: Hearing, tympanic membrane, ear canal and external ear normal. There is no impacted cerumen.     Nose: Nose normal. No congestion or rhinorrhea.     Right Turbinates: Not enlarged, swollen or pale.     Left Turbinates: Not enlarged, swollen or pale.     Right Sinus: No maxillary sinus tenderness or frontal sinus tenderness.     Left Sinus: No maxillary sinus tenderness or frontal sinus tenderness.     Mouth/Throat:     Lips: Pink.     Mouth: Mucous membranes are moist. No injury, lacerations, oral lesions or angioedema.     Pharynx: Oropharynx is clear. Uvula midline. No pharyngeal swelling, oropharyngeal exudate or posterior oropharyngeal erythema.     Tonsils: No tonsillar exudate or tonsillar abscesses.  Eyes:     General: Lids are normal. Vision grossly intact. Gaze aligned appropriately.        Right eye: No discharge.        Left eye: No discharge.     Extraocular Movements: Extraocular movements intact.     Conjunctiva/sclera: Conjunctivae normal.     Pupils: Pupils are equal, round, and reactive to light.  Neck:     Thyroid: No thyroid mass, thyromegaly or thyroid tenderness.     Vascular: No carotid bruit.     Trachea: Trachea normal. No tracheal tenderness.  Cardiovascular:     Rate and Rhythm: Normal rate and regular rhythm.     Pulses: Normal pulses.          Carotid pulses are 2+ on the right side and 2+ on the left side.      Radial pulses are 2+ on the right side and 2+ on the left side.       Femoral pulses are 2+ on the right side and 2+ on the left side.       Popliteal pulses are 2+ on the right side and 2+ on the left side.       Dorsalis pedis pulses are 2+ on the right side and 2+ on the left side.       Posterior tibial pulses are 2+ on the right side and 2+ on the left side.     Heart sounds: Normal heart sounds, S1 normal and S2 normal. No murmur heard.    No friction rub. No gallop.  Pulmonary:     Effort:  Pulmonary effort is normal. No respiratory distress.     Breath sounds: Normal breath sounds and air entry. No stridor. No wheezing, rhonchi or rales.  Chest:     Chest wall: No tenderness.  Abdominal:     General: Abdomen is flat. Bowel sounds are normal. There is no distension.     Palpations: Abdomen is soft. There is no mass.     Tenderness: There is no abdominal tenderness. There is no guarding or rebound.     Hernia: No hernia is present.  Genitourinary:    Comments: Exam deferred Musculoskeletal:        General: No swelling, tenderness, deformity or signs of injury. Normal range of motion.     Cervical back: Normal range of motion and neck supple. No rigidity or tenderness.     Right lower leg: No edema.     Left lower leg: No edema.  Lymphadenopathy:     Cervical: No cervical adenopathy.     Right cervical: No superficial, deep or posterior cervical adenopathy.    Left cervical: No superficial, deep or posterior cervical adenopathy.  Skin:    General: Skin is warm and dry.     Capillary Refill: Capillary refill takes less than 2 seconds.     Coloration: Skin is not jaundiced or pale.     Findings: No bruising, erythema, lesion or rash.     Comments: Multiple lipomas; noted his uncle also had. Has had removed previously and they come back.  Neurological:     General: No focal deficit present.     Mental Status: He is alert and oriented to person, place, and time. Mental status is at baseline.     GCS: GCS eye subscore is 4. GCS verbal subscore is 5. GCS motor subscore is 6.     Sensory: Sensation is intact. No sensory  deficit.     Motor: Motor function is intact. No weakness.     Coordination: Coordination is intact.     Gait: Gait is intact.  Psychiatric:        Attention and Perception: Attention and perception normal.        Mood and Affect: Mood and affect normal.        Speech: Speech normal.        Behavior: Behavior normal. Behavior is cooperative.        Thought Content: Thought content normal.        Cognition and Memory: Cognition normal.        Judgment: Judgment normal.    Most recent functional status assessment:    10/17/2022    8:31 AM  In your present state of health, do you have any difficulty performing the following activities:  Hearing? 0  Vision? 0  Difficulty concentrating or making decisions? 0  Walking or climbing stairs? 0  Dressing or bathing? 0  Doing errands, shopping? 0   Most recent fall risk assessment:    10/17/2022    8:31 AM  Fall Risk   Falls in the past year? 0  Number falls in past yr: 0  Injury with Fall? 0    Most recent depression screenings:    10/17/2022    8:31 AM 08/17/2022    2:52 PM  PHQ 2/9 Scores  PHQ - 2 Score 0 0  PHQ- 9 Score 0 0   Most recent cognitive screening:    03/13/2017    1:19 PM  6CIT Screen  What Year? 0 points  What month?  0 points  What time? 0 points  Count back from 20 0 points  Months in reverse 0 points  Repeat phrase 0 points  Total Score 0 points   Most recent Audit-C alcohol use screening    10/17/2022    8:32 AM  Alcohol Use Disorder Test (AUDIT)  1. How often do you have a drink containing alcohol? 0  2. How many drinks containing alcohol do you have on a typical day when you are drinking? 0  3. How often do you have six or more drinks on one occasion? 0  AUDIT-C Score 0   A score of 3 or more in women, and 4 or more in men indicates increased risk for alcohol abuse, EXCEPT if all of the points are from question 1   No results found for any visits on 10/17/22.  Assessment & Plan     Annual  wellness visit done today including the all of the following: Reviewed patient's Family Medical History Reviewed and updated list of patient's medical providers Assessment of cognitive impairment was done Assessed patient's functional ability Established a written schedule for health screening [place] Completed and Reviewed  Exercise Activities and Dietary recommendations  Goals      Exercise 150 minutes per week (moderate activity)     Continue exercising for over 150 minutes a week.         Immunization History  Administered Date(s) Administered   Fluad Quad(high Dose 65+) 06/05/2019   Influenza Split 06/28/2011, 07/14/2012   Influenza, High Dose Seasonal PF 06/11/2016, 07/15/2018, 06/22/2021   Influenza-Unspecified 06/08/2020   PFIZER(Purple Top)SARS-COV-2 Vaccination 10/25/2019, 11/18/2019, 08/04/2020   Pneumococcal Conjugate-13 03/13/2017   Pneumococcal Polysaccharide-23 07/09/2019   Tdap 08/19/2007   Zoster, Live 02/07/2012    Health Maintenance  Topic Date Due   Zoster Vaccines- Shingrix (1 of 2) Never done   DTaP/Tdap/Td (2 - Td or Tdap) 08/18/2017   Lung Cancer Screening  03/13/2018   COVID-19 Vaccine (4 - 2023-24 season) 04/27/2022   INFLUENZA VACCINE  11/25/2022 (Originally 03/27/2022)   Medicare Annual Wellness ([place])  10/18/2023   COLONOSCOPY (Pts 45-55yr Insurance coverage will need to be confirmed)  03/23/2026   Pneumonia Vaccine 72 Years old  Completed   Hepatitis C Screening  Completed   HPV VACCINES  Aged Out     Discussed health benefits of physical activity, and encouraged him to engage in regular exercise appropriate for his age and condition.    Problem List Items Addressed This Visit       Cardiovascular and Mediastinum   Aortic atherosclerosis ([place]    Chronic, previously stable Remains on 40 mg lipitor Has quit smoking BP stable I continue to recommend diet low in saturated fat and regular exercise - 30 min at  least 5 times per week       Relevant Medications   amLODipine (NORVASC) 5 MG tablet   atenolol (TENORMIN) 50 MG tablet   chlorthalidone (HYGROTON) 25 MG tablet   BP (high blood pressure)    Chronic hypertension, well controlled Continue atenolol 50, hygroton 25 Goal <140/<90      Relevant Medications   amLODipine (NORVASC) 5 MG tablet   atenolol (TENORMIN) 50 MG tablet   chlorthalidone (HYGROTON) 25 MG tablet   potassium chloride (KLOR-CON M) 10 MEQ tablet   Other Relevant Orders   CBC   Comprehensive Metabolic Panel (CMET)   Persistent atrial fibrillation (HCC)    Chronic, stable Pt endorses some vertigo symptoms;  denies falls or hearing concerns Remains on xarelto 20 mg nightly; no complaints In SR today; f/b cardiology       Relevant Medications   amLODipine (NORVASC) 5 MG tablet   atenolol (TENORMIN) 50 MG tablet   chlorthalidone (HYGROTON) 25 MG tablet     Respiratory   RESOLVED: Obstructive sleep apnea   OSA and COPD overlap syndrome (HCC)    Chronic, stable Awaiting new CPAP for OSA Continue to recommend weight reduction with goal of BMI closer to 25 mg HTN and HLD well controlled; A1c checked given hx of elevated serum glucose        Other   Annual physical exam    Recommend screening dental appt Pt without concerns; celebrating 6 years of marriage Doing well since sick appt in 07/2022      Depression, recurrent ([place])    Mild, well controlled with continued use of zoloft 50 PHQ9 of 0 in office; wishes to stay on medication Chronic, stable      Elevated serum glucose    Recommend DM screening with A1c Continue to recommend balanced, lower carb meals. Smaller meal size, adding snacks. Choosing water as drink of choice and increasing purposeful exercise.       Relevant Orders   Hemoglobin A1c   HLD (hyperlipidemia)    Chronic, mixed presentation Stable with use of lipitor 40 as well as omega OTC supplements Repeat LP      Relevant  Medications   amLODipine (NORVASC) 5 MG tablet   atenolol (TENORMIN) 50 MG tablet   chlorthalidone (HYGROTON) 25 MG tablet   Other Relevant Orders   Lipid panel   Lung cancer screening declined by patient    Patient had CT completed in 17 and 18 and continues to decline repeat screening despite presence of lung nodules at that time; further education provided       Medicare annual wellness visit, subsequent - Primary    UTD on vision and colon cancer screening, due 2027 Due for dental appt Things to do to keep yourself healthy  - Exercise at least 30-45 minutes a day, 3-4 days a week.  - Eat a low-fat diet with lots of fruits and vegetables, up to 7-9 servings per day.  - Seatbelts can save your life. Wear them always.  - Smoke detectors on every level of your home, check batteries every year.  - Eye Doctor - have an eye exam every 1-2 years  - Safe sex - if you may be exposed to STDs, use a condom.  - Alcohol -  If you drink, do it moderately, less than 2 drinks per day.  - [place]. Choose someone to speak for you if you are not able.  - Depression is common in our stressful world.If you're feeling down or losing interest in things you normally enjoy, please come in for a visit.  - Violence - If anyone is threatening or hurting you, please call immediately.       Morbid obesity (HCC)    Chronic, worsening Body mass index is 35.85 kg/m. Associated with depression and HTN      Personal history of tobacco use, presenting hazards to health    Quit smoking >10 years ago; recommendation for low dose lung CT; pt declined.      Screening for prostate cancer    Endorses some dribbling following urgency of void; declines referral to urology; recommend PSA in place of DRE. If PSA is elevated for age,  we will repeat; if PSA remains elevated pt will be referred to urology for DRE and next steps for best treatment.       Relevant Orders   PSA   Secondary  hypercoagulable state ([place])    No bruising or skin breakdown noted; remains on xarelto 20 mg for Afib      Weight gain finding    Recommend thyroid screening given weight gain of >5 lbs in 1 month      Relevant Orders   TSH   Return in about 6 months (around 04/17/2023) for chonic disease management.    Vonna Kotyk, FNP, have reviewed all documentation for this visit. The documentation on 10/17/22 for the exam, diagnosis, procedures, and orders are all accurate and complete.  Gwyneth Sprout, [place] [phone] (phone) [phone] (fax)  [place]

## 2022-10-17 ENCOUNTER — Ambulatory Visit (INDEPENDENT_AMBULATORY_CARE_PROVIDER_SITE_OTHER): Payer: Medicare HMO | Admitting: Family Medicine

## 2022-10-17 ENCOUNTER — Encounter: Payer: Self-pay | Admitting: Family Medicine

## 2022-10-17 ENCOUNTER — Encounter: Payer: Medicare HMO | Admitting: Family Medicine

## 2022-10-17 VITALS — BP 139/88 | HR 100 | Temp 97.9°F | Ht 68.0 in | Wt 235.8 lb

## 2022-10-17 DIAGNOSIS — G4733 Obstructive sleep apnea (adult) (pediatric): Secondary | ICD-10-CM

## 2022-10-17 DIAGNOSIS — I10 Essential (primary) hypertension: Secondary | ICD-10-CM | POA: Diagnosis not present

## 2022-10-17 DIAGNOSIS — I4819 Other persistent atrial fibrillation: Secondary | ICD-10-CM

## 2022-10-17 DIAGNOSIS — E782 Mixed hyperlipidemia: Secondary | ICD-10-CM

## 2022-10-17 DIAGNOSIS — Z0000 Encounter for general adult medical examination without abnormal findings: Secondary | ICD-10-CM

## 2022-10-17 DIAGNOSIS — Z87891 Personal history of nicotine dependence: Secondary | ICD-10-CM

## 2022-10-17 DIAGNOSIS — R739 Hyperglycemia, unspecified: Secondary | ICD-10-CM

## 2022-10-17 DIAGNOSIS — Z125 Encounter for screening for malignant neoplasm of prostate: Secondary | ICD-10-CM | POA: Diagnosis not present

## 2022-10-17 DIAGNOSIS — J449 Chronic obstructive pulmonary disease, unspecified: Secondary | ICD-10-CM

## 2022-10-17 DIAGNOSIS — R635 Abnormal weight gain: Secondary | ICD-10-CM | POA: Diagnosis not present

## 2022-10-17 DIAGNOSIS — F339 Major depressive disorder, recurrent, unspecified: Secondary | ICD-10-CM | POA: Diagnosis not present

## 2022-10-17 DIAGNOSIS — D6869 Other thrombophilia: Secondary | ICD-10-CM

## 2022-10-17 DIAGNOSIS — I700 Atherosclerosis of aorta: Secondary | ICD-10-CM

## 2022-10-17 DIAGNOSIS — Z6835 Body mass index (BMI) 35.0-35.9, adult: Secondary | ICD-10-CM

## 2022-10-17 DIAGNOSIS — Z5320 Procedure and treatment not carried out because of patient's decision for unspecified reasons: Secondary | ICD-10-CM

## 2022-10-17 MED ORDER — POTASSIUM CHLORIDE CRYS ER 10 MEQ PO TBCR: 10.0000 meq | EXTENDED_RELEASE_TABLET | Freq: Two times a day (BID) | ORAL | 3 refills | Status: AC

## 2022-10-17 MED ORDER — ATENOLOL 50 MG PO TABS: 50.0000 mg | ORAL_TABLET | Freq: Every day | ORAL | 3 refills | Status: AC

## 2022-10-17 MED ORDER — CHLORTHALIDONE 25 MG PO TABS: ORAL_TABLET | ORAL | 3 refills | Status: DC

## 2022-10-17 MED ORDER — AMLODIPINE BESYLATE 5 MG PO TABS: ORAL_TABLET | ORAL | 3 refills | Status: AC

## 2022-10-17 NOTE — Assessment & Plan Note (Signed)
No bruising or skin breakdown noted; remains on xarelto 20 mg for Afib

## 2022-10-17 NOTE — Assessment & Plan Note (Signed)
Chronic hypertension, well controlled Continue atenolol 50, hygroton 25 Goal <140/<90

## 2022-10-17 NOTE — Assessment & Plan Note (Signed)
UTD on vision and colon cancer screening, due 2027 Due for dental appt Things to do to keep yourself healthy  - Exercise at least 30-45 minutes a day, 3-4 days a week.  - Eat a low-fat diet with lots of fruits and vegetables, up to 7-9 servings per day.  - Seatbelts can save your life. Wear them always.  - Smoke detectors on every level of your home, check batteries every year.  - Eye Doctor - have an eye exam every 1-2 years  - Safe sex - if you may be exposed to STDs, use a condom.  - Alcohol -  If you drink, do it moderately, less than 2 drinks per day.  - [place]. Choose someone to speak for you if you are not able.  - Depression is common in our stressful world.If you're feeling down or losing interest in things you normally enjoy, please come in for a visit.  - Violence - If anyone is threatening or hurting you, please call immediately.

## 2022-10-17 NOTE — Assessment & Plan Note (Signed)
Endorses some dribbling following urgency of void; declines referral to urology; recommend PSA in place of DRE. If PSA is elevated for age, we will repeat; if PSA remains elevated pt will be referred to urology for DRE and next steps for best treatment.

## 2022-10-17 NOTE — Assessment & Plan Note (Signed)
Quit smoking >10 years ago; recommendation for low dose lung CT; pt declined.

## 2022-10-17 NOTE — Assessment & Plan Note (Signed)
Chronic, mixed presentation Stable with use of lipitor 40 as well as omega OTC supplements Repeat LP

## 2022-10-17 NOTE — Patient Instructions (Signed)
The CDC recommends two doses of Shingrix (the new shingles vaccine) separated by 2 to 6 months for adults age 73 years and older. I recommend checking with your insurance plan regarding coverage for this vaccine.    

## 2022-10-17 NOTE — Assessment & Plan Note (Signed)
Recommend screening dental appt Pt without concerns; celebrating 73 years of marriage Doing well since sick appt in 07/2022

## 2022-10-17 NOTE — Assessment & Plan Note (Signed)
Mild, well controlled with continued use of zoloft 50 PHQ9 of 0 in office; wishes to stay on medication Chronic, stable

## 2022-10-17 NOTE — Assessment & Plan Note (Signed)
Recommend thyroid screening given weight gain of >5 lbs in 1 month

## 2022-10-17 NOTE — Assessment & Plan Note (Signed)
Chronic, worsening Body mass index is 35.85 kg/m. Associated with depression and HTN

## 2022-10-17 NOTE — Assessment & Plan Note (Signed)
Patient had CT completed in 17 and 18 and continues to decline repeat screening despite presence of lung nodules at that time; further education provided

## 2022-10-17 NOTE — Assessment & Plan Note (Signed)
Recommend DM screening with A1c Continue to recommend balanced, lower carb meals. Smaller meal size, adding snacks. Choosing water as drink of choice and increasing purposeful exercise.

## 2022-10-17 NOTE — Assessment & Plan Note (Addendum)
Chronic, stable Awaiting new CPAP for OSA Continue to recommend weight reduction with goal of BMI closer to 25 mg HTN and HLD well controlled; A1c checked given hx of elevated serum glucose

## 2022-10-17 NOTE — Assessment & Plan Note (Signed)
Chronic, previously stable Remains on 40 mg lipitor Has quit smoking BP stable I continue to recommend diet low in saturated fat and regular exercise - 30 min at least 5 times per week

## 2022-10-17 NOTE — Assessment & Plan Note (Signed)
Chronic, stable Pt endorses some vertigo symptoms; denies falls or hearing concerns Remains on xarelto 20 mg nightly; no complaints In SR today; f/b cardiology

## 2022-10-18 LAB — CBC
Hematocrit: 46.9 % (ref 37.5–51.0)
Hemoglobin: 16 g/dL (ref 13.0–17.7)
MCH: 31.4 pg (ref 26.6–33.0)
MCHC: 34.1 g/dL (ref 31.5–35.7)
MCV: 92 fL (ref 79–97)
Platelets: 197 10*3/uL (ref 150–450)
RBC: 5.1 x10E6/uL (ref 4.14–5.80)
RDW: 14.1 % (ref 11.6–15.4)
WBC: 6.3 10*3/uL (ref 3.4–10.8)

## 2022-10-18 LAB — COMPREHENSIVE METABOLIC PANEL
ALT: 20 IU/L (ref 0–44)
AST: 22 IU/L (ref 0–40)
Albumin/Globulin Ratio: 1.8 (ref 1.2–2.2)
Albumin: 4.2 g/dL (ref 3.8–4.8)
Alkaline Phosphatase: 110 IU/L (ref 44–121)
BUN/Creatinine Ratio: 14 (ref 10–24)
BUN: 15 mg/dL (ref 8–27)
Bilirubin Total: 1.1 mg/dL (ref 0.0–1.2)
CO2: 28 mmol/L (ref 20–29)
Calcium: 9.5 mg/dL (ref 8.6–10.2)
Chloride: 97 mmol/L (ref 96–106)
Creatinine, Ser: 1.05 mg/dL (ref 0.76–1.27)
Globulin, Total: 2.4 g/dL (ref 1.5–4.5)
Glucose: 111 mg/dL — ABNORMAL HIGH (ref 70–99)
Potassium: 3.7 mmol/L (ref 3.5–5.2)
Sodium: 140 mmol/L (ref 134–144)
Total Protein: 6.6 g/dL (ref 6.0–8.5)
eGFR: 75 mL/min/{1.73_m2} (ref >59)

## 2022-10-18 LAB — PSA: Prostate Specific Ag, Serum: 0.7 ng/mL (ref 0.0–4.0)

## 2022-10-18 LAB — LIPID PANEL
Chol/HDL Ratio: 3.6 ratio (ref 0.0–5.0)
Cholesterol, Total: 144 mg/dL (ref 100–199)
HDL: 40 mg/dL (ref >39)
LDL Chol Calc (NIH): 75 mg/dL (ref 0–99)
Triglycerides: 168 mg/dL — ABNORMAL HIGH (ref 0–149)
VLDL Cholesterol Cal: 29 mg/dL (ref 5–40)

## 2022-10-18 LAB — HEMOGLOBIN A1C
Est. average glucose Bld gHb Est-mCnc: 120 mg/dL
Hgb A1c MFr Bld: 5.8 % — ABNORMAL HIGH (ref 4.8–5.6)

## 2022-10-18 LAB — TSH: TSH: 1.6 u[IU]/mL (ref 0.450–4.500)

## 2022-10-18 NOTE — Progress Notes (Signed)
All labs normal and stable with exception of three month average of blood sugar, A1c, which indicates pre-diabetes. Continue to recommend balanced, lower carb meals. Smaller meal size, adding snacks. Choosing water as drink of choice and increasing purposeful exercise.  Please let us know if you have any questions.  Thank you, Gwyneth Sprout, [address] [place], [place] [address] [phone] (phone) [phone] (fax) [place]

## 2022-10-22 ENCOUNTER — Telehealth: Payer: Self-pay

## 2022-10-22 DIAGNOSIS — I4819 Other persistent atrial fibrillation: Secondary | ICD-10-CM

## 2022-10-22 MED ORDER — RIVAROXABAN 20 MG PO TABS: 20.0000 mg | ORAL_TABLET | Freq: Every day | ORAL | 5 refills | Status: DC

## 2022-10-22 NOTE — Telephone Encounter (Signed)
Prescription refill request for Xarelto received.  Indication: a fib Last office visit: 05/29/22 Weight: 235 Age: 72 Scr: 1.05 CrCl: 94 mL/min

## 2022-10-23 ENCOUNTER — Telehealth: Payer: Self-pay | Admitting: Family Medicine

## 2022-10-23 NOTE — Progress Notes (Deleted)
   Argentina Ponder Anasia Agro,acting as a scribe for Lelon Huh, MD.,have documented all relevant documentation on the behalf of Lelon Huh, MD,as directed by  Lelon Huh, MD while in the presence of Lelon Huh, MD.     Established patient visit   Patient: Louis Luna   DOB: [birth year]   72 y.o. Male  MRN: [id number] Visit Date: 10/24/2022  Today's healthcare provider: Lelon Huh, MD   No chief complaint on file.  Subjective    HPI  ***  Medications: Outpatient Medications Prior to Visit  Medication Sig   amLODipine (NORVASC) 5 MG tablet TAKE 1 TABLET(5 MG) BY MOUTH DAILY   atenolol (TENORMIN) 50 MG tablet Take 1 tablet (50 mg total) by mouth daily.   atorvastatin (LIPITOR) 40 MG tablet Take 1 tablet (40 mg total) by mouth daily.   calcium-vitamin D (OSCAL WITH D) 500-5 MG-MCG tablet Take 1 tablet by mouth daily with breakfast.   chlorthalidone (HYGROTON) 25 MG tablet TAKE 1 TABLET(25 MG) BY MOUTH EVERY MORNING   Coenzyme Q10 (CO Q 10) 100 MG CAPS Take 100 mg by mouth daily.   diphenhydrAMINE (BENADRYL) 25 MG tablet Take 25 mg by mouth every 6 (six) hours as needed for sleep.   docusate sodium (COLACE) 100 MG capsule Take 200 mg by mouth 2 (two) times daily as needed for mild constipation or moderate constipation.   MILK THISTLE PO Take 70 mg by mouth daily. Liver health   Misc Natural Products (PROSTATE HEALTH PO) Take 250 mg by mouth in the morning and at bedtime. Super Beta Prostate   Omega-3-6-9 CAPS Take 1 capsule by mouth 2 (two) times daily.   potassium chloride (KLOR-CON M) 10 MEQ tablet Take 1 tablet (10 mEq total) by mouth 2 (two) times daily.   psyllium (METAMUCIL SMOOTH TEXTURE) 28 % packet Take 1 packet by mouth 2 (two) times daily.   rivaroxaban (XARELTO) 20 MG TABS tablet Take 1 tablet (20 mg total) by mouth daily with supper.   sertraline (ZOLOFT) 50 MG tablet TAKE 1 TABLET BY MOUTH EVERY DAY   vitamin B-12 (CYANOCOBALAMIN) 500 MCG tablet Take 500 mcg  by mouth daily.   Vitamin D3 (VITAMIN D) 25 MCG tablet Take 1,000 Units by mouth daily.   No facility-administered medications prior to visit.    Review of Systems  {Labs  Heme  Chem  Endocrine  Serology  Results Review (optional):23779}   Objective    There were no vitals taken for this visit. {Show previous vital signs (optional):23777}  Physical Exam  ***  No results found for any visits on 10/24/22.  Assessment & Plan     ***  No follow-ups on file.      {provider attestation***:1}   Lelon Huh, MD  [place] [phone] (phone) [phone] (fax)  [place]

## 2022-10-23 NOTE — Telephone Encounter (Signed)
Copied from [place] [id number]. Topic: General - Other >> Oct 23, 2022  1:03 PM Ludger Nutting wrote: Patient states that the company that supplied his cpap and supplies has gone out of business and is requesting that an order be sent to [facility] for new supplies. [phone].

## 2022-10-24 ENCOUNTER — Ambulatory Visit: Payer: Medicare HMO | Admitting: Family Medicine

## 2022-10-24 NOTE — Telephone Encounter (Signed)
[facility] and asked them to follow up with patient. They reports they have all documents and orders.

## 2022-10-26 NOTE — Telephone Encounter (Signed)
[facility] confirmed that shipment has been sent. Patient will receive supplies on Tuesday.  Tel: [phone] Fax: [phone]. Patient advised.

## 2022-10-26 NOTE — Telephone Encounter (Signed)
Tonya calling from [facility] is calling to report that no orders have been received. Where orders meant to be sent to [facility]?  Everything in the chart was sent to [facility]. CB- [phone]

## 2022-11-12 ENCOUNTER — Other Ambulatory Visit: Payer: Self-pay | Admitting: *Deleted

## 2022-11-12 DIAGNOSIS — I4819 Other persistent atrial fibrillation: Secondary | ICD-10-CM

## 2022-11-12 MED ORDER — RIVAROXABAN 20 MG PO TABS: 20.0000 mg | ORAL_TABLET | Freq: Every day | ORAL | 5 refills | Status: DC

## 2022-11-12 NOTE — Telephone Encounter (Signed)
Xarelto 20mg  refill request received. Pt is 72 years old, weight-107kg, Crea-1.05 on 10/17/22, last seen by on Melinda Crutch, NP on 05/29/22, Diagnosis-Afib, CrCl- 94.83 mL/min; Dose is appropriate based on dosing criteria. Will send in refill to requested pharmacy.

## 2022-11-23 ENCOUNTER — Encounter: Payer: Self-pay | Admitting: Cardiology

## 2022-11-23 ENCOUNTER — Ambulatory Visit: Payer: Medicare HMO | Attending: Cardiology | Admitting: Cardiology

## 2022-11-23 VITALS — BP 128/84 | HR 85 | Ht 68.0 in | Wt 237.4 lb

## 2022-11-23 DIAGNOSIS — E7800 Pure hypercholesterolemia, unspecified: Secondary | ICD-10-CM | POA: Diagnosis not present

## 2022-11-23 DIAGNOSIS — I4821 Permanent atrial fibrillation: Secondary | ICD-10-CM | POA: Diagnosis not present

## 2022-11-23 DIAGNOSIS — I10 Essential (primary) hypertension: Secondary | ICD-10-CM | POA: Diagnosis not present

## 2022-11-23 NOTE — Progress Notes (Signed)
Cardiology Office Note:    Date:  11/23/2022   ID:  Louis Luna, Louis Luna Apr 30, 1951, MRN [id number]  PCP:  Gwyneth Sprout, [facility] Providers Cardiologist:  Kate Sable, MD Electrophysiologist:  Vickie Epley, MD     Referring MD: Eulas Post, MD   Chief Complaint  Patient presents with   Follow-up    6 month f/u, no new cardiac concerns     History of Present Illness:    Louis Luna is a 72 y.o. male with a hx of permanent atrial fibrillation s/p failed DCCV 08/2021, hypertension, hyperlipidemia, former smoker x20+ years who presents for follow-up.   Feels well, tolerating medications as prescribed, recently quit drinking.  Working on losing some weight.  Denies any bleeding issues with taking Xarelto, denies palpitations, dizziness or syncope.  Compliant with all medications as prescribed.  Prior notes Echo 05/2021 EF 50 to 55%.  Mildly dilated LA. DCCV 08/2021  Past Medical History:  Diagnosis Date   Depression    GERD (gastroesophageal reflux disease)    H/O elevated lipids    HLD (hyperlipidemia)    Hypertension    Personal history of tobacco use, presenting hazards to health 01/26/2016   Sleep apnea     Past Surgical History:  Procedure Laterality Date   ANKLE SURGERY     APPENDECTOMY     CARDIOVERSION N/A 09/11/2021   Procedure: CARDIOVERSION;  Surgeon: Kate Sable, MD;  Location: [facility];  Service: Cardiovascular;  Laterality: N/A;   COLONOSCOPY N/A 03/23/2016   Procedure: COLONOSCOPY;  Surgeon: Manya Silvas, MD;  Location: [place] [facility];  Service: Endoscopy;  Laterality: N/A;   HERNIA REPAIR     inguinal-left 04/1979   QUADRICEPS TENDON REPAIR Left 05/05/2015   Procedure: REPAIR QUADRICEP TENDON;  Surgeon: Hessie Knows, MD;  Location: [facility];  Service: Orthopedics;  Laterality: Left;   TUMOR EXCISION     from Rt side of the neck-benign    Current Medications: Current Meds  Medication Sig   amLODipine  (NORVASC) 5 MG tablet TAKE 1 TABLET(5 MG) BY MOUTH DAILY   atenolol (TENORMIN) 50 MG tablet Take 1 tablet (50 mg total) by mouth daily.   atorvastatin (LIPITOR) 40 MG tablet Take 1 tablet (40 mg total) by mouth daily.   calcium-vitamin D (OSCAL WITH D) 500-5 MG-MCG tablet Take 1 tablet by mouth daily with breakfast.   chlorthalidone (HYGROTON) 25 MG tablet TAKE 1 TABLET(25 MG) BY MOUTH EVERY MORNING   Misc Natural Products (PROSTATE HEALTH PO) Take 250 mg by mouth in the morning and at bedtime. Super Beta Prostate   Omega-3-6-9 CAPS Take 1 capsule by mouth 2 (two) times daily.   potassium chloride (KLOR-CON M) 10 MEQ tablet Take 1 tablet (10 mEq total) by mouth 2 (two) times daily.   rivaroxaban (XARELTO) 20 MG TABS tablet Take 1 tablet (20 mg total) by mouth daily with supper.   sertraline (ZOLOFT) 50 MG tablet TAKE 1 TABLET BY MOUTH EVERY DAY   vitamin B-12 (CYANOCOBALAMIN) 500 MCG tablet Take 500 mcg by mouth daily.   Vitamin D3 (VITAMIN D) 25 MCG tablet Take 1,000 Units by mouth daily.     Allergies:   Patient has no known allergies.   Social History   Socioeconomic History   Marital status: Married    Spouse name: [name]   Number of children: 2   Years of education: 18   Highest education level: Not on file  Occupational History  Occupation: retired  Tobacco Use   Smoking status: Former    Packs/day: 1.00    Years: 30.00    Additional pack years: 0.00    Total pack years: 30.00    Types: Cigarettes    Quit date: 10/26/2011    Years since quitting: 11.0   Smokeless tobacco: Never  Vaping Use   Vaping Use: Never used  Substance and Sexual Activity   Alcohol use: Yes    Comment: ocassionally-beer   Drug use: No   Sexual activity: Never    Birth control/protection: None  Other Topics Concern   Not on file  Social History Narrative   Not on file   Social Determinants of Health   Financial Resource Strain: Not on file  Food Insecurity: Not on file  Transportation  Needs: Not on file  Physical Activity: Not on file  Stress: Not on file  Social Connections: Not on file     Family History: The patient's family history includes Alcohol abuse in his brother and father; Alzheimer's disease in his father; Breast cancer in his mother, sister, and sister; Depression in his sister and sister; Diabetes in his mother; Hypertension in his brother, brother, father, and mother; Melanoma in his sister.  ROS:   Please see the history of present illness.     All other systems reviewed and are negative.  EKGs/Labs/Other Studies Reviewed:    The following studies were reviewed today:   EKG:  EKG is  ordered today.  The ekg ordered today demonstrates atrial fibrillation, heart rate 85.  Recent Labs: 10/17/2022: ALT 20; BUN 15; Creatinine, Ser 1.05; Hemoglobin 16.0; Platelets 197; Potassium 3.7; Sodium 140; TSH 1.600  Recent Lipid Panel    Component Value Date/Time   CHOL 144 10/17/2022 0846   TRIG 168 (H) 10/17/2022 0846   HDL 40 10/17/2022 0846   CHOLHDL 3.6 10/17/2022 0846   LDLCALC 75 10/17/2022 0846     Risk Assessment/Calculations:         Physical Exam:    VS:  BP 128/84 (BP Location: Left Arm, Patient Position: Sitting, Cuff Size: Large)   Pulse 85   Ht 5\' 8"  (1.727 m)   Wt 237 lb 6.4 oz (107.7 kg)   SpO2 95%   BMI 36.10 kg/m     Wt Readings from Last 3 Encounters:  11/23/22 237 lb 6.4 oz (107.7 kg)  10/17/22 235 lb 12.8 oz (107 kg)  08/17/22 231 lb (104.8 kg)     GEN:  Well nourished, well developed in no acute distress HEENT: Normal NECK: No JVD; No carotid bruits CARDIAC: Irregular irregular RESPIRATORY:  Clear to auscultation without rales, wheezing or rhonchi  ABDOMEN: Soft, non-tender, non-distended MUSCULOSKELETAL:  No edema; No deformity  SKIN: Warm and dry NEUROLOGIC:  Alert and oriented x 3 PSYCHIATRIC:  Normal affect   ASSESSMENT:    1. Permanent atrial fibrillation ([place])   2. Primary hypertension   3. Pure  hypercholesterolemia    PLAN:    In order of problems listed above:  Permanent A. fib, previous failed DC cardioversion.  CHA2DS2-VASc score 2 (htn, age).  Heart rate controlled rhythm.  continue atenolol 50 mg daily, Xarelto 20 mg daily.  EF 50 to 55%.  Evaluated by EP/A-fib clinic, no plans to restore sinus rhythm. Hypertension, BP controlled. continue chlorthalidone, amlodipine, atenolol. Hyperlipidemia,  continue Lipitor 40 mg daily.  Follow-up in 12 months.     Medication Adjustments/Labs and Tests Ordered: Current medicines are reviewed at length with  the patient today.  Concerns regarding medicines are outlined above.  Orders Placed This Encounter  Procedures   EKG 12-Lead    No orders of the defined types were placed in this encounter.    Patient Instructions  Medication Instructions:   Your physician recommends that you continue on your current medications as directed. Please refer to the Current Medication list given to you today.  *If you need a refill on your cardiac medications before your next appointment, please call your pharmacy*   Lab Work:  None ordered  If you have labs (blood work) drawn today and your tests are completely normal, you will receive your results only by: [place] (if you have MyChart) OR A paper copy in the mail If you have any lab test that is abnormal or we need to change your treatment, we will call you to review the results.   Testing/Procedures:  None Ordered   Follow-Up: At [facility], you and your health needs are our priority.  As part of our continuing mission to provide you with exceptional heart care, we have created designated Provider Care Teams.  These Care Teams include your primary Cardiologist (physician) and Advanced Practice Providers (APPs -  Physician Assistants and Nurse Practitioners) who all work together to provide you with the care you need, when you need it.  We recommend signing up for  the patient portal called "MyChart".  Sign up information is provided on this After Visit Summary.  MyChart is used to connect with patients for Virtual Visits (Telemedicine).  Patients are able to view lab/test results, encounter notes, upcoming appointments, etc.  Non-urgent messages can be sent to your provider as well.   To learn more about what you can do with MyChart, go to [email or web address].    Your next appointment:   12 month(s)  Provider:   You may see Kate Sable, MD or one of the following Advanced Practice Providers on your designated Care Team:   Murray Hodgkins, NP Christell Faith, PA-C Cadence Kathlen Mody, PA-C Gerrie Nordmann, NP   Signed, Kate Sable, MD  11/23/2022 8:54 AM    [place]

## 2022-11-23 NOTE — Patient Instructions (Signed)
Medication Instructions:   Your physician recommends that you continue on your current medications as directed. Please refer to the Current Medication list given to you today.  *If you need a refill on your cardiac medications before your next appointment, please call your pharmacy*   Lab Work:  None ordered  If you have labs (blood work) drawn today and your tests are completely normal, you will receive your results only by: MyChart Message (if you have MyChart) OR A paper copy in the mail If you have any lab test that is abnormal or we need to change your treatment, we will call you to review the results.   Testing/Procedures:  None Ordered   Follow-Up: At [facility], you and your health needs are our priority.  As part of our continuing mission to provide you with exceptional heart care, we have created designated Provider Care Teams.  These Care Teams include your primary Cardiologist (physician) and Advanced Practice Providers (APPs -  Physician Assistants and Nurse Practitioners) who all work together to provide you with the care you need, when you need it.  We recommend signing up for the patient portal called "MyChart".  Sign up information is provided on this After Visit Summary.  MyChart is used to connect with patients for Virtual Visits (Telemedicine).  Patients are able to view lab/test results, encounter notes, upcoming appointments, etc.  Non-urgent messages can be sent to your provider as well.   To learn more about what you can do with MyChart, go to [email or web address].    Your next appointment:   12 month(s)  Provider:   You may see Brian Agbor-Etang, MD or one of the following Advanced Practice Providers on your designated Care Team:   Christopher Berge, NP Ryan Dunn, PA-C Cadence Furth, PA-C Sheri Hammock, NP 

## 2022-12-26 ENCOUNTER — Ambulatory Visit (INDEPENDENT_AMBULATORY_CARE_PROVIDER_SITE_OTHER): Payer: Medicare HMO | Admitting: Family Medicine

## 2022-12-26 ENCOUNTER — Encounter: Payer: Self-pay | Admitting: Family Medicine

## 2022-12-26 ENCOUNTER — Ambulatory Visit: Payer: Self-pay

## 2022-12-26 VITALS — BP 129/85 | HR 116 | Temp 98.7°F | Ht 70.0 in | Wt 228.0 lb

## 2022-12-26 DIAGNOSIS — M545 Low back pain, unspecified: Secondary | ICD-10-CM

## 2022-12-26 MED ORDER — CELECOXIB 200 MG PO CAPS: 200.0000 mg | ORAL_CAPSULE | Freq: Two times a day (BID) | ORAL | 0 refills | Status: AC

## 2022-12-26 MED ORDER — METHOCARBAMOL 750 MG PO TABS: 750.0000 mg | ORAL_TABLET | Freq: Four times a day (QID) | ORAL | 0 refills | Status: AC

## 2022-12-26 MED ORDER — KETOROLAC TROMETHAMINE 60 MG/2ML IM SOLN: 60.0000 mg | Freq: Once | INTRAMUSCULAR | Status: AC

## 2022-12-26 MED ORDER — METHYLPREDNISOLONE ACETATE 40 MG/ML IJ SUSP: 40.0000 mg | Freq: Once | INTRAMUSCULAR | Status: AC

## 2022-12-26 NOTE — Progress Notes (Signed)
I,J'ya E Hunter,acting as a scribe for Jacky Kindle, FNP.,have documented all relevant documentation on the behalf of Jacky Kindle, FNP,as directed by  Jacky Kindle, FNP while in the presence of Jacky Kindle, FNP.  Established patient visit   Patient: Louis Luna   DOB: 10/18/1950   72 y.o. Male  MRN: [id number] Visit Date: 12/26/2022  Today's healthcare provider: Jacky Kindle, FNP  Re Introduced to nurse practitioner role and practice setting.  All questions answered.  Discussed provider/patient relationship and expectations.   Chief Complaint  Patient presents with   Back Pain   Subjective    Back Pain This is a recurrent problem. The current episode started yesterday. The problem occurs constantly. The problem has been gradually worsening since onset. The pain is present in the gluteal and sacro-iliac. The quality of the pain is described as stabbing and aching. The pain does not radiate. The pain is at a severity of 10/10. The pain is severe. He has tried NSAIDs, heat and bed rest for the symptoms. The treatment provided no relief.    Medications: Outpatient Medications Prior to Visit  Medication Sig   amLODipine (NORVASC) 5 MG tablet TAKE 1 TABLET(5 MG) BY MOUTH DAILY   atenolol (TENORMIN) 50 MG tablet Take 1 tablet (50 mg total) by mouth daily.   atorvastatin (LIPITOR) 40 MG tablet Take 1 tablet (40 mg total) by mouth daily.   calcium-vitamin D (OSCAL WITH D) 500-5 MG-MCG tablet Take 1 tablet by mouth daily with breakfast.   chlorthalidone (HYGROTON) 25 MG tablet TAKE 1 TABLET(25 MG) BY MOUTH EVERY MORNING   Misc Natural Products (PROSTATE HEALTH PO) Take 250 mg by mouth in the morning and at bedtime. Super Beta Prostate   Omega-3-6-9 CAPS Take 1 capsule by mouth 2 (two) times daily.   potassium chloride (KLOR-CON M) 10 MEQ tablet Take 1 tablet (10 mEq total) by mouth 2 (two) times daily.   rivaroxaban (XARELTO) 20 MG TABS tablet Take 1 tablet (20 mg total) by  mouth daily with supper.   sertraline (ZOLOFT) 50 MG tablet TAKE 1 TABLET BY MOUTH EVERY DAY   vitamin B-12 (CYANOCOBALAMIN) 500 MCG tablet Take 500 mcg by mouth daily.   Vitamin D3 (VITAMIN D) 25 MCG tablet Take 1,000 Units by mouth daily.   Coenzyme Q10 (CO Q 10) 100 MG CAPS Take 100 mg by mouth daily. (Patient not taking: Reported on 12/26/2022)   docusate sodium (COLACE) 100 MG capsule Take 200 mg by mouth 2 (two) times daily as needed for mild constipation or moderate constipation. (Patient not taking: Reported on 11/23/2022)   psyllium (METAMUCIL SMOOTH TEXTURE) 28 % packet Take 1 packet by mouth 2 (two) times daily. (Patient not taking: Reported on 11/23/2022)   No facility-administered medications prior to visit.    Review of Systems  Musculoskeletal:  Positive for back pain.     Objective    BP 129/85 (BP Location: Right Arm, Patient Position: Sitting, Cuff Size: Large)   Pulse (!) 116   Temp 98.7 F (37.1 C) (Oral)   Ht 5\' 10"  (1.778 m)   Wt 228 lb (103.4 kg)   SpO2 95%   BMI 32.71 kg/m   Physical Exam Vitals and nursing note reviewed.  Constitutional:      Appearance: Normal appearance. He is obese.  HENT:     Head: Normocephalic and atraumatic.  Eyes:     Pupils: Pupils are equal, round, and reactive to light.  Cardiovascular:     Rate and Rhythm: Tachycardia present. Rhythm irregular.     Pulses: Normal pulses.     Heart sounds: Normal heart sounds.  Pulmonary:     Effort: Pulmonary effort is normal.     Breath sounds: Normal breath sounds.  Abdominal:     General: Bowel sounds are normal.     Palpations: Abdomen is soft.     Tenderness: There is no right CVA tenderness or left CVA tenderness.  Musculoskeletal:        General: Tenderness and signs of injury present. Normal range of motion.     Cervical back: Normal range of motion.     Comments: L lower back pain and pinched nerve; unable to stand erect.  Skin:    General: Skin is warm and dry.      Capillary Refill: Capillary refill takes less than 2 seconds.  Neurological:     General: No focal deficit present.     Mental Status: He is alert and oriented to person, place, and time. Mental status is at baseline.  Psychiatric:        Mood and Affect: Mood normal.        Behavior: Behavior normal.        Thought Content: Thought content normal.        Judgment: Judgment normal.     No results found for any visits on 12/26/22.  Assessment & Plan     Problem List Items Addressed This Visit       Other   Acute left-sided low back pain without sciatica - Primary    Acute, self limiting Combo toradol and steroid injection; pt tolerated well Recommend starting celebrex and robaxin 12/28/22 to assist with stretching, and RICE [rest, ice, compression, elevation] Call back as needed; can refer to ortho      Relevant Medications   celecoxib (CELEBREX) 200 MG capsule   methocarbamol (ROBAXIN-750) 750 MG tablet   ketorolac (TORADOL) injection 60 mg   methylPREDNISolone acetate (DEPO-MEDROL) injection 40 mg   No follow-ups on file.     Leilani Merl, FNP, have reviewed all documentation for this visit. The documentation on 12/26/22 for the exam, diagnosis, procedures, and orders are all accurate and complete.  Jacky Kindle, FNP  [facility] [phone] (phone) [phone] (fax)  [facility]

## 2022-12-26 NOTE — Telephone Encounter (Signed)
  Chief Complaint: left hip pain Symptoms: severe pain with walking- moderate pain when still/sitting Frequency: yesterday after mowing Pertinent Negatives: Patient denies shooting pain down leg or redness Disposition: [] ED /[] Urgent Care (no appt availability in office) / [x] Appointment(In office/virtual)/ []  [facility]/ [] Home Care/ [] Refused Recommended Disposition /[] [facility]/ []  Follow-up with PCP Additional Notes:  Reason for Disposition  [1] SEVERE pain (e.g., excruciating, unable to do any normal activities) AND [2] not improved after 2 hours of pain medicine  Answer Assessment - Initial Assessment Questions 1. LOCATION and RADIATION: "Where is the pain located?"      Left pain  2. QUALITY: "What does the pain feel like?"  (e.g., sharp, dull, aching, burning)     cramp 3. SEVERITY: "How bad is the pain?" "What does it keep you from doing?"   (Scale 1-10; or mild, moderate, severe)   -  MILD (1-3): doesn't interfere with normal activities    -  MODERATE (4-7): interferes with normal activities (e.g., work or school) or awakens from sleep, limping    -  SEVERE (8-10): excruciating pain, unable to do any normal activities, unable to walk     Severe with walking 4/10 still 4. ONSET: "When did the pain start?" "Does it come and go, or is it there all the time?"     Yesterday  5. WORK OR EXERCISE: "Has there been any recent work or exercise that involved this part of the body?"      Mowed yard then after pain started 6. CAUSE: "What do you think is causing the hip pain?"      unsure 7. AGGRAVATING FACTORS: "What makes the hip pain worse?" (e.g., walking, climbing stairs, running)     walking 8. OTHER SYMPTOMS: "Do you have any other symptoms?" (e.g., back pain, pain shooting down leg,  fever, rash)     no  Protocols used: Hip Pain-A-AH

## 2022-12-26 NOTE — Assessment & Plan Note (Signed)
Acute, self limiting Combo toradol and steroid injection; pt tolerated well Recommend starting celebrex and robaxin 12/28/22 to assist with stretching, and RICE [rest, ice, compression, elevation] Call back as needed; can refer to ortho

## 2022-12-28 DIAGNOSIS — G4733 Obstructive sleep apnea (adult) (pediatric): Secondary | ICD-10-CM | POA: Diagnosis not present

## 2022-12-28 DIAGNOSIS — M4316 Spondylolisthesis, lumbar region: Secondary | ICD-10-CM | POA: Diagnosis not present

## 2022-12-28 DIAGNOSIS — J449 Chronic obstructive pulmonary disease, unspecified: Secondary | ICD-10-CM | POA: Diagnosis not present

## 2022-12-28 DIAGNOSIS — M5442 Lumbago with sciatica, left side: Secondary | ICD-10-CM | POA: Diagnosis not present

## 2022-12-28 DIAGNOSIS — I10 Essential (primary) hypertension: Secondary | ICD-10-CM | POA: Diagnosis not present

## 2022-12-28 DIAGNOSIS — M5136 Other intervertebral disc degeneration, lumbar region: Secondary | ICD-10-CM | POA: Diagnosis not present

## 2022-12-28 DIAGNOSIS — I4819 Other persistent atrial fibrillation: Secondary | ICD-10-CM | POA: Diagnosis not present

## 2022-12-28 DIAGNOSIS — I700 Atherosclerosis of aorta: Secondary | ICD-10-CM | POA: Diagnosis not present

## 2023-01-07 ENCOUNTER — Other Ambulatory Visit: Payer: Self-pay | Admitting: Family Medicine

## 2023-01-07 DIAGNOSIS — I10 Essential (primary) hypertension: Secondary | ICD-10-CM

## 2023-01-08 DIAGNOSIS — M5442 Lumbago with sciatica, left side: Secondary | ICD-10-CM | POA: Diagnosis not present

## 2023-03-26 DIAGNOSIS — I872 Venous insufficiency (chronic) (peripheral): Secondary | ICD-10-CM | POA: Diagnosis not present

## 2023-03-26 DIAGNOSIS — I4891 Unspecified atrial fibrillation: Secondary | ICD-10-CM | POA: Diagnosis not present

## 2023-03-26 DIAGNOSIS — R0609 Other forms of dyspnea: Secondary | ICD-10-CM | POA: Diagnosis not present

## 2023-03-26 DIAGNOSIS — M25571 Pain in right ankle and joints of right foot: Secondary | ICD-10-CM | POA: Diagnosis not present

## 2023-03-26 DIAGNOSIS — J449 Chronic obstructive pulmonary disease, unspecified: Secondary | ICD-10-CM | POA: Diagnosis not present

## 2023-03-26 DIAGNOSIS — G4733 Obstructive sleep apnea (adult) (pediatric): Secondary | ICD-10-CM | POA: Diagnosis not present

## 2023-04-18 DIAGNOSIS — G8929 Other chronic pain: Secondary | ICD-10-CM | POA: Diagnosis not present

## 2023-04-18 DIAGNOSIS — M545 Low back pain, unspecified: Secondary | ICD-10-CM | POA: Diagnosis not present

## 2023-04-18 DIAGNOSIS — M25552 Pain in left hip: Secondary | ICD-10-CM | POA: Diagnosis not present

## 2023-04-18 DIAGNOSIS — M160 Bilateral primary osteoarthritis of hip: Secondary | ICD-10-CM | POA: Diagnosis not present

## 2023-05-28 ENCOUNTER — Other Ambulatory Visit: Payer: Self-pay | Admitting: Cardiology

## 2023-05-28 ENCOUNTER — Telehealth: Payer: Self-pay | Admitting: Family Medicine

## 2023-05-28 NOTE — Telephone Encounter (Signed)
Opened in error

## 2023-05-30 DIAGNOSIS — M47816 Spondylosis without myelopathy or radiculopathy, lumbar region: Secondary | ICD-10-CM | POA: Diagnosis not present

## 2023-05-30 DIAGNOSIS — M1612 Unilateral primary osteoarthritis, left hip: Secondary | ICD-10-CM | POA: Diagnosis not present

## 2023-05-30 DIAGNOSIS — M51369 Other intervertebral disc degeneration, lumbar region without mention of lumbar back pain or lower extremity pain: Secondary | ICD-10-CM | POA: Diagnosis not present

## 2023-05-30 DIAGNOSIS — I700 Atherosclerosis of aorta: Secondary | ICD-10-CM | POA: Diagnosis not present

## 2023-06-18 ENCOUNTER — Other Ambulatory Visit: Payer: Self-pay | Admitting: Family Medicine

## 2023-06-18 DIAGNOSIS — F32A Depression, unspecified: Secondary | ICD-10-CM

## 2023-07-02 DIAGNOSIS — M25552 Pain in left hip: Secondary | ICD-10-CM | POA: Diagnosis not present

## 2023-07-02 DIAGNOSIS — G4733 Obstructive sleep apnea (adult) (pediatric): Secondary | ICD-10-CM | POA: Diagnosis not present

## 2023-07-02 DIAGNOSIS — I4819 Other persistent atrial fibrillation: Secondary | ICD-10-CM | POA: Diagnosis not present

## 2023-07-02 DIAGNOSIS — I10 Essential (primary) hypertension: Secondary | ICD-10-CM | POA: Diagnosis not present

## 2023-07-02 DIAGNOSIS — R739 Hyperglycemia, unspecified: Secondary | ICD-10-CM | POA: Diagnosis not present

## 2023-07-02 DIAGNOSIS — J449 Chronic obstructive pulmonary disease, unspecified: Secondary | ICD-10-CM | POA: Diagnosis not present

## 2023-07-02 DIAGNOSIS — I700 Atherosclerosis of aorta: Secondary | ICD-10-CM | POA: Diagnosis not present

## 2023-07-02 DIAGNOSIS — G8929 Other chronic pain: Secondary | ICD-10-CM | POA: Diagnosis not present

## 2023-07-04 DIAGNOSIS — G8929 Other chronic pain: Secondary | ICD-10-CM | POA: Diagnosis not present

## 2023-07-04 DIAGNOSIS — J449 Chronic obstructive pulmonary disease, unspecified: Secondary | ICD-10-CM | POA: Diagnosis not present

## 2023-07-04 DIAGNOSIS — I10 Essential (primary) hypertension: Secondary | ICD-10-CM | POA: Diagnosis not present

## 2023-07-04 DIAGNOSIS — I700 Atherosclerosis of aorta: Secondary | ICD-10-CM | POA: Diagnosis not present

## 2023-07-04 DIAGNOSIS — M25552 Pain in left hip: Secondary | ICD-10-CM | POA: Diagnosis not present

## 2023-07-04 DIAGNOSIS — G4733 Obstructive sleep apnea (adult) (pediatric): Secondary | ICD-10-CM | POA: Diagnosis not present

## 2023-07-04 DIAGNOSIS — I4819 Other persistent atrial fibrillation: Secondary | ICD-10-CM | POA: Diagnosis not present

## 2023-07-04 DIAGNOSIS — E7800 Pure hypercholesterolemia, unspecified: Secondary | ICD-10-CM | POA: Diagnosis not present

## 2023-07-17 ENCOUNTER — Other Ambulatory Visit: Payer: Self-pay

## 2023-07-17 DIAGNOSIS — I4819 Other persistent atrial fibrillation: Secondary | ICD-10-CM

## 2023-07-17 MED ORDER — RIVAROXABAN 20 MG PO TABS: 20.0000 mg | ORAL_TABLET | Freq: Every day | ORAL | 5 refills | Status: DC

## 2023-07-17 NOTE — Telephone Encounter (Signed)
Prescription refill request for Xarelto received.  Indication: Afib  Last office visit: 11/23/22 (Agbor-Etang) Weight: 103.4kg Age: 72 Scr: 1.05 (10/17/22)  CrCl: 91.36ml/min  Appropriate dose. Refill sent.

## 2023-08-01 DIAGNOSIS — L570 Actinic keratosis: Secondary | ICD-10-CM | POA: Diagnosis not present

## 2023-08-01 DIAGNOSIS — G4733 Obstructive sleep apnea (adult) (pediatric): Secondary | ICD-10-CM | POA: Diagnosis not present

## 2023-08-01 DIAGNOSIS — I4819 Other persistent atrial fibrillation: Secondary | ICD-10-CM | POA: Diagnosis not present

## 2023-08-01 DIAGNOSIS — Z23 Encounter for immunization: Secondary | ICD-10-CM | POA: Diagnosis not present

## 2023-08-01 DIAGNOSIS — N3941 Urge incontinence: Secondary | ICD-10-CM | POA: Diagnosis not present

## 2023-08-01 DIAGNOSIS — M25562 Pain in left knee: Secondary | ICD-10-CM | POA: Diagnosis not present

## 2023-08-01 DIAGNOSIS — I10 Essential (primary) hypertension: Secondary | ICD-10-CM | POA: Diagnosis not present

## 2023-08-06 DIAGNOSIS — G912 (Idiopathic) normal pressure hydrocephalus: Secondary | ICD-10-CM | POA: Diagnosis not present

## 2023-08-06 DIAGNOSIS — F339 Major depressive disorder, recurrent, unspecified: Secondary | ICD-10-CM | POA: Diagnosis not present

## 2023-08-06 DIAGNOSIS — I4819 Other persistent atrial fibrillation: Secondary | ICD-10-CM | POA: Diagnosis not present

## 2023-08-06 DIAGNOSIS — I700 Atherosclerosis of aorta: Secondary | ICD-10-CM | POA: Diagnosis not present

## 2023-08-06 DIAGNOSIS — G3184 Mild cognitive impairment, so stated: Secondary | ICD-10-CM | POA: Diagnosis not present

## 2023-08-06 DIAGNOSIS — G4733 Obstructive sleep apnea (adult) (pediatric): Secondary | ICD-10-CM | POA: Diagnosis not present

## 2023-08-06 DIAGNOSIS — I10 Essential (primary) hypertension: Secondary | ICD-10-CM | POA: Diagnosis not present

## 2023-08-06 DIAGNOSIS — N3941 Urge incontinence: Secondary | ICD-10-CM | POA: Diagnosis not present

## 2023-08-06 DIAGNOSIS — J449 Chronic obstructive pulmonary disease, unspecified: Secondary | ICD-10-CM | POA: Diagnosis not present

## 2023-08-07 ENCOUNTER — Other Ambulatory Visit: Payer: Self-pay | Admitting: Family Medicine

## 2023-08-07 DIAGNOSIS — G3184 Mild cognitive impairment, so stated: Secondary | ICD-10-CM

## 2023-08-07 DIAGNOSIS — G912 (Idiopathic) normal pressure hydrocephalus: Secondary | ICD-10-CM

## 2023-08-07 DIAGNOSIS — M25562 Pain in left knee: Secondary | ICD-10-CM | POA: Diagnosis not present

## 2023-08-13 DIAGNOSIS — I700 Atherosclerosis of aorta: Secondary | ICD-10-CM | POA: Diagnosis not present

## 2023-08-13 DIAGNOSIS — G912 (Idiopathic) normal pressure hydrocephalus: Secondary | ICD-10-CM | POA: Diagnosis not present

## 2023-08-13 DIAGNOSIS — J449 Chronic obstructive pulmonary disease, unspecified: Secondary | ICD-10-CM | POA: Diagnosis not present

## 2023-08-13 DIAGNOSIS — I4819 Other persistent atrial fibrillation: Secondary | ICD-10-CM | POA: Diagnosis not present

## 2023-08-13 DIAGNOSIS — I10 Essential (primary) hypertension: Secondary | ICD-10-CM | POA: Diagnosis not present

## 2023-08-13 DIAGNOSIS — G3184 Mild cognitive impairment, so stated: Secondary | ICD-10-CM | POA: Diagnosis not present

## 2023-08-13 DIAGNOSIS — G4733 Obstructive sleep apnea (adult) (pediatric): Secondary | ICD-10-CM | POA: Diagnosis not present

## 2023-08-13 DIAGNOSIS — N3941 Urge incontinence: Secondary | ICD-10-CM | POA: Diagnosis not present

## 2023-08-14 DIAGNOSIS — M25562 Pain in left knee: Secondary | ICD-10-CM | POA: Diagnosis not present

## 2023-08-23 DIAGNOSIS — M25562 Pain in left knee: Secondary | ICD-10-CM | POA: Diagnosis not present

## 2023-08-30 DIAGNOSIS — J449 Chronic obstructive pulmonary disease, unspecified: Secondary | ICD-10-CM | POA: Diagnosis not present

## 2023-08-30 DIAGNOSIS — F339 Major depressive disorder, recurrent, unspecified: Secondary | ICD-10-CM | POA: Diagnosis not present

## 2023-08-30 DIAGNOSIS — M545 Low back pain, unspecified: Secondary | ICD-10-CM | POA: Diagnosis not present

## 2023-08-30 DIAGNOSIS — G4733 Obstructive sleep apnea (adult) (pediatric): Secondary | ICD-10-CM | POA: Diagnosis not present

## 2023-08-30 DIAGNOSIS — G3184 Mild cognitive impairment, so stated: Secondary | ICD-10-CM | POA: Diagnosis not present

## 2023-09-02 ENCOUNTER — Other Ambulatory Visit: Payer: Self-pay

## 2023-09-02 ENCOUNTER — Emergency Department: Payer: Medicare HMO

## 2023-09-02 ENCOUNTER — Encounter: Payer: Self-pay | Admitting: Medical Oncology

## 2023-09-02 ENCOUNTER — Emergency Department
Admission: EM | Admit: 2023-09-02 | Discharge: 2023-09-11 | Disposition: A | Discharge status: Home / Self Care | Payer: Medicare HMO | Attending: Emergency Medicine | Admitting: Emergency Medicine

## 2023-09-02 DIAGNOSIS — R3915 Urgency of urination: Secondary | ICD-10-CM | POA: Diagnosis not present

## 2023-09-02 DIAGNOSIS — F03A0 Unspecified dementia, mild, without behavioral disturbance, psychotic disturbance, mood disturbance, and anxiety: Secondary | ICD-10-CM | POA: Diagnosis not present

## 2023-09-02 DIAGNOSIS — R410 Disorientation, unspecified: Secondary | ICD-10-CM | POA: Insufficient documentation

## 2023-09-02 DIAGNOSIS — R41841 Cognitive communication deficit: Secondary | ICD-10-CM | POA: Diagnosis not present

## 2023-09-02 DIAGNOSIS — I517 Cardiomegaly: Secondary | ICD-10-CM | POA: Diagnosis not present

## 2023-09-02 DIAGNOSIS — R9082 White matter disease, unspecified: Secondary | ICD-10-CM | POA: Diagnosis not present

## 2023-09-02 DIAGNOSIS — M6281 Muscle weakness (generalized): Secondary | ICD-10-CM | POA: Diagnosis not present

## 2023-09-02 DIAGNOSIS — R2689 Other abnormalities of gait and mobility: Secondary | ICD-10-CM | POA: Insufficient documentation

## 2023-09-02 DIAGNOSIS — M545 Low back pain, unspecified: Secondary | ICD-10-CM | POA: Diagnosis not present

## 2023-09-02 DIAGNOSIS — Z79899 Other long term (current) drug therapy: Secondary | ICD-10-CM

## 2023-09-02 DIAGNOSIS — R4182 Altered mental status, unspecified: Secondary | ICD-10-CM | POA: Insufficient documentation

## 2023-09-02 DIAGNOSIS — I700 Atherosclerosis of aorta: Secondary | ICD-10-CM | POA: Diagnosis not present

## 2023-09-02 DIAGNOSIS — F0390 Unspecified dementia without behavioral disturbance: Secondary | ICD-10-CM | POA: Diagnosis not present

## 2023-09-02 DIAGNOSIS — I6782 Cerebral ischemia: Secondary | ICD-10-CM | POA: Insufficient documentation

## 2023-09-02 DIAGNOSIS — G319 Degenerative disease of nervous system, unspecified: Secondary | ICD-10-CM | POA: Diagnosis not present

## 2023-09-02 DIAGNOSIS — F03A2 Unspecified dementia, mild, with psychotic disturbance: Secondary | ICD-10-CM | POA: Diagnosis present

## 2023-09-02 DIAGNOSIS — M25552 Pain in left hip: Secondary | ICD-10-CM | POA: Insufficient documentation

## 2023-09-02 LAB — URINALYSIS, ROUTINE W REFLEX MICROSCOPIC
Bacteria, UA: NONE SEEN
Bilirubin Urine: NEGATIVE
Glucose, UA: NEGATIVE mg/dL
Hgb urine dipstick: NEGATIVE
Ketones, ur: NEGATIVE mg/dL
Leukocytes,Ua: NEGATIVE
Nitrite: NEGATIVE
Protein, ur: 100 mg/dL — AB
Specific Gravity, Urine: 1.019 (ref 1.005–1.030)
Squamous Epithelial / HPF: 0 /[HPF] (ref 0–5)
pH: 5 (ref 5.0–8.0)

## 2023-09-02 LAB — COMPREHENSIVE METABOLIC PANEL
ALT: 18 U/L (ref 0–44)
AST: 25 U/L (ref 15–41)
Albumin: 4.2 g/dL (ref 3.5–5.0)
Alkaline Phosphatase: 86 U/L (ref 38–126)
Anion gap: 16 — ABNORMAL HIGH (ref 5–15)
BUN: 32 mg/dL — ABNORMAL HIGH (ref 8–23)
CO2: 26 mmol/L (ref 22–32)
Calcium: 9.6 mg/dL (ref 8.9–10.3)
Chloride: 96 mmol/L — ABNORMAL LOW (ref 98–111)
Creatinine, Ser: 1.14 mg/dL (ref 0.61–1.24)
GFR, Estimated: >60 mL/min (ref >60)
Glucose, Bld: 98 mg/dL (ref 70–99)
Potassium: 2.8 mmol/L — ABNORMAL LOW (ref 3.5–5.1)
Sodium: 138 mmol/L (ref 135–145)
Total Bilirubin: 2.1 mg/dL — ABNORMAL HIGH (ref 0.0–1.2)
Total Protein: 7.2 g/dL (ref 6.5–8.1)

## 2023-09-02 LAB — CBC WITH DIFFERENTIAL/PLATELET
Abs Immature Granulocytes: 0.02 10*3/uL (ref 0.00–0.07)
Basophils Absolute: 0.1 10*3/uL (ref 0.0–0.1)
Basophils Relative: 1 %
Eosinophils Absolute: 0.2 10*3/uL (ref 0.0–0.5)
Eosinophils Relative: 3 %
HCT: 40.9 % (ref 39.0–52.0)
Hemoglobin: 14.1 g/dL (ref 13.0–17.0)
Immature Granulocytes: 0 %
Lymphocytes Relative: 18 %
Lymphs Abs: 1.6 10*3/uL (ref 0.7–4.0)
MCH: 31.2 pg (ref 26.0–34.0)
MCHC: 34.5 g/dL (ref 30.0–36.0)
MCV: 90.5 fL (ref 80.0–100.0)
Monocytes Absolute: 0.9 10*3/uL (ref 0.1–1.0)
Monocytes Relative: 10 %
Neutro Abs: 6.3 10*3/uL (ref 1.7–7.7)
Neutrophils Relative %: 68 %
Platelets: 214 10*3/uL (ref 150–400)
RBC: 4.52 MIL/uL (ref 4.22–5.81)
RDW: 15.1 % (ref 11.5–15.5)
WBC: 9.1 10*3/uL (ref 4.0–10.5)
nRBC: 0 % (ref 0.0–0.2)

## 2023-09-02 LAB — URINE DRUG SCREEN, QUALITATIVE (ARMC ONLY)
Amphetamines, Ur Screen: NOT DETECTED
Barbiturates, Ur Screen: NOT DETECTED
Benzodiazepine, Ur Scrn: POSITIVE — AB
Cannabinoid 50 Ng, Ur ~~LOC~~: NOT DETECTED
Cocaine Metabolite,Ur ~~LOC~~: NOT DETECTED
MDMA (Ecstasy)Ur Screen: NOT DETECTED
Methadone Scn, Ur: NOT DETECTED
Opiate, Ur Screen: POSITIVE — AB
Phencyclidine (PCP) Ur S: NOT DETECTED
Tricyclic, Ur Screen: POSITIVE — AB

## 2023-09-02 LAB — ETHANOL: Alcohol, Ethyl (B): <10 mg/dL (ref <10)

## 2023-09-02 LAB — ACETAMINOPHEN LEVEL: Acetaminophen (Tylenol), Serum: 11 ug/mL (ref 10–30)

## 2023-09-02 LAB — AMMONIA: Ammonia: 37 umol/L — ABNORMAL HIGH (ref 9–35)

## 2023-09-02 LAB — SALICYLATE LEVEL: Salicylate Lvl: <7 mg/dL — ABNORMAL LOW (ref 7.0–30.0)

## 2023-09-02 MED ORDER — POTASSIUM CHLORIDE CRYS ER 20 MEQ PO TBCR
40.0000 meq | EXTENDED_RELEASE_TABLET | Freq: Once | ORAL | Status: AC
  Administered 2023-09-02: 40 meq via ORAL
  Filled 2023-09-02: qty 2

## 2023-09-02 NOTE — ED Notes (Signed)
 Blood and urine sent to lab

## 2023-09-02 NOTE — ED Notes (Signed)
 Patient pleasant upon approach. Ambulatory. Disoriented to situation. States he needs to leave to get to a funeral.

## 2023-09-02 NOTE — ED Notes (Signed)
Pt IVC 

## 2023-09-02 NOTE — ED Triage Notes (Signed)
 Pt from home via [place] [employer]- pts wife had reported pt was missing all night long. Recent diagnosis of dementia and since has been getting progressively more confused. Pt took car last night and didn't return home until this am. Pts wife concerned about pts well being. Pt arrives to ED alert and confused. Pt aware that he has been more confused than usual

## 2023-09-02 NOTE — BH Assessment (Signed)
 Comprehensive Clinical Assessment (CCA) Screening, Triage and Referral Note  09/02/2023 Louis Luna [id number] Recommendations for Services/Supports/Treatments: Psych consult/disposition pending. [name]. Louis "Ed" is a 73 year old, English speaking, white male with a dx of dementia. Pt presented to [facility] ED under IVC. Per triage note: Pt from home via [place] [employer]- pts wife had reported pt. was missing all night long. Recent diagnosis of dementia and since has been getting progressively more confused. Pt took car last night and didn't return home until this am. Pts wife concerned about pts well-being. Pt arrives to ED alert and confused. Pt aware that he has been more confused than usual  On assessment, pt. admitted to being recently dx with dementia. Pt expressed feelings of frustration about everyone's concern about him being out all night. 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  Pt later admitted to feeling terrified of his increased confusion and the fact that he was out all night. Pt was fixated on his issues of incontinence.  Pt insisted that the situation was blown out of proportion. Pt continued to perseverate about irrelevant topics throughout the assessment. Pt was pleasant but restless. Pt had lacking insight and impaired memory. Pt stated the year is 2001. Pt had a euthymic mood and a congruent affect. Pt denied having SI, HI, AV/H.   Flowsheet Row ED from 09/02/2023 in [facility] Emergency Department at [facility]  C-SSRS RISK CATEGORY No Risk        Chief Complaint:  Chief Complaint  Patient presents with   Altered Mental Status   IVC   Visit Diagnosis: Moderate major neurocognitive disorder due to unknown etiology, with anxiety   Patient Reported Information How did you hear about us ? No data recorded What Is the Reason for Your Visit/Call Today? No data recorded How Long Has This Been Causing You Problems? No data recorded What Do You Feel  Would Help You the Most Today? No data recorded  Have You Recently Had Any Thoughts About Hurting Yourself? No data recorded Are You Planning to Commit Suicide/Harm Yourself At This time? No data recorded  Have you Recently Had Thoughts About Hurting Someone [place]? No data recorded Are You Planning to Harm Someone at This Time? No data recorded Explanation: No data recorded  Have You Used Any Alcohol or Drugs in the Past 24 Hours? No data recorded How Long Ago Did You Use Drugs or Alcohol? No data recorded What Did You Use and How Much? No data recorded  Do You Currently Have a Therapist/Psychiatrist? No data recorded Name of Therapist/Psychiatrist: No data recorded  Have You Been Recently Discharged From Any Office Practice or Programs? No data recorded Explanation of Discharge From Practice/Program: No data recorded   CCA Screening Triage Referral Assessment Type of Contact: No data recorded Telemedicine Service Delivery:   Is this Initial or Reassessment?   Date Telepsych consult ordered in [facility]:    Time Telepsych consult ordered in [facility]:    Location of Assessment: No data recorded Provider Location: No data recorded   Collateral Involvement: No data recorded  Does Patient Have a Court Appointed Legal Guardian? No data recorded Name and Contact of Legal Guardian: No data recorded If Minor and Not Living with Parent(s), Who has Custody? No data recorded Is CPS involved or ever been involved? No data recorded Is APS involved or ever been involved? No data recorded  Patient Determined To Be At Risk for Harm To Self or Others Based on  Review of Patient Reported Information or Presenting Complaint? No data recorded Method: No data recorded Availability of Means: No data recorded Intent: No data recorded Notification Required: No data recorded Additional Information for Danger to Others Potential: No data recorded Additional Comments for Danger to Others Potential: No data  recorded Are There Guns or Other Weapons in Your Home? No data recorded Types of Guns/Weapons: No data recorded Are These Weapons Safely Secured?                            No data recorded Who Could Verify You Are Able To Have These Secured: No data recorded Do You Have any Outstanding Charges, Pending Court Dates, Parole/Probation? No data recorded Contacted To Inform of Risk of Harm To Self or Others: No data recorded  Does Patient Present under Involuntary Commitment? No data recorded   [place] of Residence: No data recorded  Patient Currently Receiving the Following Services: No data recorded  Determination of Need: No data recorded  Options For Referral: No data recorded  Disposition Recommendation per psychiatric provider:We recommend inpatient psychiatric hospitalization when medically cleared.   Bryceton Hantz R Rowan Blaker, LCAS

## 2023-09-02 NOTE — ED Provider Notes (Signed)
 [facility] Provider Note    Event Date/Time   First MD Initiated Contact with Patient 09/02/23 1536     (approximate)   History   Altered Mental Status and IVC   HPI Louis Luna is a 73 y.o. male with history of dementia presenting today for altered mental status.  Patient reportedly took the car yesterday evening and was out all night per family.  They called a missing persons report.  They state he has had worsening altered mental status over the past several days.  Has reportedly been diagnosed with dementia recently as well.  Otherwise they deny any acute symptoms.  Police was called and he was placed under IVC.  Patient himself notes that he has been confused recently but otherwise able to answer orientation questions.  He denies chest pain, nausea, vomiting, fever, chills, congestion, shortness of breath, abdominal pain, dysuria.     Physical Exam   Triage Vital Signs: ED Triage Vitals  Encounter Vitals Group     BP 09/02/23 1548 104/70     Systolic BP Percentile --      Diastolic BP Percentile --      Pulse Rate 09/02/23 1548 84     Resp 09/02/23 1548 18     Temp 09/02/23 1548 98.2 F (36.8 C)     Temp Source 09/02/23 1548 Oral     SpO2 09/02/23 1548 94 %     Weight 09/02/23 1549 227 lb 1.2 oz (103 kg)     Height 09/02/23 1549 5' 10 (1.778 m)     Head Circumference --      Peak Flow --      Pain Score 09/02/23 1548 7     Pain Loc --      Pain Education --      Exclude from Growth Chart --     Most recent vital signs: Vitals:   09/02/23 1548  BP: 104/70  Pulse: 84  Resp: 18  Temp: 98.2 F (36.8 C)  SpO2: 94%   Physical Exam: I have reviewed the vital signs and nursing notes. General: Awake, alert, no acute distress.  Nontoxic appearing. Head:  Atraumatic, normocephalic.   ENT:  EOM intact, PERRL. Oral mucosa is pink and moist with no lesions. Neck: Neck is supple with full range of motion, No meningeal  signs. Cardiovascular:  RRR, No murmurs. Peripheral pulses palpable and equal bilaterally. Respiratory:  Symmetrical chest wall expansion.  No rhonchi, rales, or wheezes.  Good air movement throughout.  No use of accessory muscles.   Musculoskeletal:  No cyanosis or edema. Moving extremities with full ROM Abdomen:  Soft, nontender, nondistended. Neuro:  GCS 15, moving all four extremities, interacting appropriately. Speech clear. Psych:  Calm, appropriate.   Skin:  Warm, dry, no rash.    ED Results / Procedures / Treatments   Labs (all labs ordered are listed, but only abnormal results are displayed) Labs Reviewed  COMPREHENSIVE METABOLIC PANEL - Abnormal; Notable for the following components:      Result Value   Potassium 2.8 (*)    Chloride 96 (*)    BUN 32 (*)    Total Bilirubin 2.1 (*)    Anion gap 16 (*)    All other components within normal limits  SALICYLATE LEVEL - Abnormal; Notable for the following components:   Salicylate Lvl <7.0 (*)    All other components within normal limits  AMMONIA - Abnormal; Notable for the following components:  Ammonia 37 (*)    All other components within normal limits  URINALYSIS, ROUTINE W REFLEX MICROSCOPIC - Abnormal; Notable for the following components:   Color, Urine YELLOW (*)    APPearance CLEAR (*)    Protein, ur 100 (*)    All other components within normal limits  URINE DRUG SCREEN, QUALITATIVE ([facility] ONLY) - Abnormal; Notable for the following components:   Tricyclic, Ur Screen POSITIVE (*)    Opiate, Ur Screen POSITIVE (*)    Benzodiazepine, Ur Scrn POSITIVE (*)    All other components within normal limits  CBC WITH DIFFERENTIAL/PLATELET  ETHANOL  ACETAMINOPHEN  LEVEL     EKG    RADIOLOGY Independently interpreted CT head with no acute pathology   PROCEDURES:  Critical Care performed: No  Procedures   MEDICATIONS ORDERED IN ED: Medications  potassium chloride  SA (KLOR-CON  M) CR tablet 40 mEq (40 mEq Oral  Given 09/02/23 1803)     IMPRESSION / MDM / ASSESSMENT AND PLAN / ED COURSE  I reviewed the triage vital signs and the nursing notes.                              Differential diagnosis includes, but is not limited to, dementia, hyperammonemia, UTI, infection, electrolyte abnormality  Patient's presentation is most consistent with acute complicated illness / injury requiring diagnostic workup.  Patient is a 73 year old male presenting today as an IVC after concerns of altered mental status overnight.  Does have a history of dementia and may simply be worsening of this.  Patient has no acute complaints on arrival and vital signs are stable.  Physical exam unremarkable.  Admits to having confusion recently but otherwise feels fine at this time.  Laboratory workup largely unremarkable.  Although ammonia is at 37, he is not acutely encephalopathic.  Potassium was slightly low and he was given oral repletion.  Otherwise show some signs of dehydration but is able to tolerate oral p.o.  UA with no sign of infection.  CT head negative.  No other acute medical concerns at this time and will consult psychiatry for further evaluation.  Patient signed out to oncoming provider pending completion of psychiatry evaluation.  Clinical Course as of 09/02/23 2305  Mon Sep 02, 2023  1752 Ammonia(!): 37 No significant elevation to obvious explain altered mental status.  Patient also not altered at this time. [DW]  1940 No obvious acute cause of patient's altered mental status based on medical workup at this time.  Reassessed and he is still asymptomatic with no obvious evidence of altered mental status right now.  Will have psychiatric evaluation [DW]    Clinical Course User Index [DW] Malvina Alm DASEN, MD     FINAL CLINICAL IMPRESSION(S) / ED DIAGNOSES   Final diagnoses:  Confusion  Mild dementia without behavioral disturbance, psychotic disturbance, mood disturbance, or anxiety, unspecified dementia type  (HCC)     Rx / DC Orders   ED Discharge Orders     None        Note:  This document was prepared using Dragon voice recognition software and may include unintentional dictation errors.   Malvina Alm DASEN, MD 09/02/23 [phone]

## 2023-09-02 NOTE — ED Notes (Signed)
 Pt belongings: Wallace Cullens socks Wallace Cullens underwear [employer] ($15 cash, check) Shoes Wallace Cullens sweatshirt

## 2023-09-03 DIAGNOSIS — F03A2 Unspecified dementia, mild, with psychotic disturbance: Secondary | ICD-10-CM | POA: Diagnosis present

## 2023-09-03 MED ORDER — RIVAROXABAN 20 MG PO TABS
20.0000 mg | ORAL_TABLET | Freq: Every day | ORAL | Status: DC
  Administered 2023-09-03 – 2023-09-11 (×9): 20 mg via ORAL
  Filled 2023-09-03 (×10): qty 1

## 2023-09-03 MED ORDER — AMLODIPINE BESYLATE 5 MG PO TABS
5.0000 mg | ORAL_TABLET | Freq: Every day | ORAL | Status: DC
  Administered 2023-09-03 – 2023-09-11 (×9): 5 mg via ORAL
  Filled 2023-09-03 (×9): qty 1

## 2023-09-03 MED ORDER — DIAZEPAM 5 MG PO TABS: 5.0000 mg | ORAL_TABLET | Freq: Four times a day (QID) | ORAL | Status: DC | PRN

## 2023-09-03 MED ORDER — HYDROCODONE-ACETAMINOPHEN 5-325 MG PO TABS
1.0000 | ORAL_TABLET | ORAL | Status: DC | PRN
  Administered 2023-09-04 – 2023-09-06 (×2): 1 via ORAL
  Filled 2023-09-03 (×2): qty 1

## 2023-09-03 MED ORDER — CHLORTHALIDONE 25 MG PO TABS
25.0000 mg | ORAL_TABLET | Freq: Every day | ORAL | Status: DC
  Administered 2023-09-04 – 2023-09-11 (×8): 25 mg via ORAL
  Filled 2023-09-03 (×8): qty 1

## 2023-09-03 MED ORDER — SERTRALINE HCL 50 MG PO TABS
50.0000 mg | ORAL_TABLET | Freq: Every day | ORAL | Status: DC
  Administered 2023-09-03 – 2023-09-11 (×9): 50 mg via ORAL
  Filled 2023-09-03 (×9): qty 1

## 2023-09-03 MED ORDER — ATENOLOL 25 MG PO TABS
50.0000 mg | ORAL_TABLET | Freq: Every day | ORAL | Status: DC
  Administered 2023-09-03 – 2023-09-11 (×9): 50 mg via ORAL
  Filled 2023-09-03 (×9): qty 2

## 2023-09-03 MED ORDER — ATORVASTATIN CALCIUM 20 MG PO TABS
40.0000 mg | ORAL_TABLET | Freq: Every day | ORAL | Status: DC
  Administered 2023-09-03 – 2023-09-11 (×9): 40 mg via ORAL
  Filled 2023-09-03 (×8): qty 2

## 2023-09-03 NOTE — ED Notes (Signed)
 Pt ambulated to bathroom to preform ADL's no assistance required

## 2023-09-03 NOTE — BH Assessment (Signed)
 Per [facility]), patient to be referred out of system.  Referral information for Psychiatric Hospitalization faxed to;   [facility] ([phone]),   [facility] [phone]) [phone]  [place] [phone]),  [address] [phone]),   [place] [phone] -or- [phone]),   [facility] [phone]),  [place] [phone])

## 2023-09-03 NOTE — ED Provider Notes (Signed)
 Emergency Medicine Observation Re-evaluation Note  Physical Exam   BP 104/70 (BP Location: Left Arm)   Pulse 84   Temp 98.2 F (36.8 C) (Oral)   Resp 18   Ht 5' 10 (1.778 m)   Wt 103 kg   SpO2 94%   BMI 32.58 kg/m   Patient appears in no acute distress.  ED Course / MDM   No reported events during my shift at the time of this note.   Pt is awaiting dispo from consultants   Ginnie Shams MD    Shams Ginnie, MD 09/03/23 0700

## 2023-09-03 NOTE — ED Notes (Signed)
 IVC/ rec psych inpt

## 2023-09-03 NOTE — ED Notes (Signed)
 Hospital meal provided.  100% consumed, pt tolerated w/o complaints.  Waste discarded appropriately.

## 2023-09-03 NOTE — Consult Note (Signed)
 [facility] Consult Note  Patient Name: Louis Luna MRN: [id number] DOB: [birth year] DATE OF Consult: 09/03/2023  PRIMARY PSYCHIATRIC DIAGNOSES  1.  Mild major neurocognitive disorder due to unknown etiology, with psychotic disturbance (HCC)    RECOMMENDATIONS  Inpt psych admission recommended:    [x] YES       []  NO   If yes:       [x]   Pt meets involuntary commitment criteria if not voluntary       []    Pt does not meet involuntary commitment criteria and must be         voluntary. If patient is not voluntary, then discharge is recommended.  Recommendation for inpatient for safety as wife with recent diagnosis of cognitive decline and she cannot keep him safely at home; daughter needs social work assistance and daughter requesting further neurological workup with MRI.     Follow-Up Telepsychiatry C/L services:            []  We will continue to follow this patient with you.             [x]  Will sign off for now. Please re-consult our service as necessary.  Thank you for involving us  in the care of this patient. If you have any additional questions or concerns, please call [phone] and ask for me or the provider on-call.  TELEPSYCHIATRY ATTESTATION & CONSENT  As the provider for this telehealth consult, I attest that I verified the patient's identity using two separate identifiers, introduced myself to the patient, provided my credentials, disclosed my location, and performed this encounter via a HIPAA-compliant, real-time, face-to-face, two-way, interactive audio and video platform and with the full consent and agreement of the patient (or guardian as applicable.)  Patient physical location: [facility] Tele[facility] provider physical location: home office in state of [place]  Video start time: 23:53pm (Central Time) Video end time: 00:23 (Central Time)  IDENTIFYING DATA  Louis Luna is a 73 y.o. year-old male for whom a psychiatric consultation has been ordered by the  primary provider. The patient was identified using two separate identifiers.  CHIEF COMPLAINT/REASON FOR CONSULT  It may be easier for me to pick through what was going on at the time, I don't like to be negative.  HISTORY OF PRESENT ILLNESS (HPI)  The patient presented to emergency room after missing for one night.  He took the car yesterday evening and was out all night per family. They called a missing persons report. They state he has had worsening altered mental status over the past several days.  He was He has history of depression, prescribed sertraline  and prn diazepam .  Reported compliance.  He is poor historian.   During this interview, he informed me he came to hospital for his knee pain.  He reports he drove his mother and his sister here to the hospital with him tonight   He knew he was at hospital in [place], [place]  didn't know name of hospital; he didn't know the date don't have my watch on so I can't cheat, Jan, 2022; He thought recent holiday was election day.    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     Today, client denied symptoms of depression with anergia, anhedonia, amotivation, no anxiety, frequent worry, feeling restlessness, no reported panic symptoms, no reported obsessive/compulsive behaviors. Client denies active SI/HI ideations, plans or intent. There is no evidence of psychosis or delusional thinking.  Client  denied past episodes of hypomania, hyperactivity, erratic/excessive spending, involvement in dangerous activities, self-inflated ego, grandiosity, or promiscuity.  sleeping 5-6 hrs/24hrs, appetite I eat like a bird can't you tell, good concentration it's ok unless I get behind on things, I got behind on the bills when I got to work and injured.     Admits to feeling at times been in an automobile when I'm by myself I don't know how to get back or get confused to the road.   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  He then admits to feeling confused to his surroundings doesn't frighten me but makes me mad sometimes.   Review of records: had PCP appt on 08/29/22 which indicated workup underway for cognitive decline  Spoke with daughter on telephone; she reports past 1.19month he has really went downhill with his confusion. Daughter reports he began to have visual hallucinations, started seeing dogs, he thought wife was his mother, thought his daughter was still young upstairs in her bedroom.  He told family yesterday that he needed to be in heaven with his son.  Family reports he has been in tremendous pain in back and knees. Daughter reports he was sneaking fireball to try to help with pain, he was coming in drinking, mom found him passed out behind wheel in garage. She thinks he quit 3 months ago.    Reviewed active outpatient medication list/reviewed labs. Obtained Collateral information from medical record.  PAST PSYCHIATRIC HISTORY   Previous Psychiatric Hospitalizations: denied Previous Detox/Residential treatments: denied Outpt treatment:  PCP prescribing sertraline  Previous psychotropic medication trials: unknown at this time Previous mental health diagnosis per client/MEDICAL RECORD NUMBERMajor depressive disorder, recurrent episode,   Suicide attempts/self-injurious behaviors:  denied history of suicidal/homicidal ideation/gestures; denied history of self-mutilation behaviors    PAST MEDICAL HISTORY  Past Medical History:  Diagnosis Date   Depression    GERD (gastroesophageal reflux disease)    H/O elevated lipids    HLD (hyperlipidemia)    Hypertension    Personal history of tobacco use, presenting hazards to health 01/26/2016   Sleep apnea      HOME MEDICATIONS  Facility Ordered Medications  Medication   methylPREDNISolone  acetate (DEPO-MEDROL ) injection 40 mg   [COMPLETED] potassium chloride  SA (KLOR-CON  M) CR tablet 40 mEq   PTA  Medications  Medication Sig   cyclobenzaprine  (FLEXERIL ) 10 MG tablet Take 10 mg by mouth 3 (three) times daily.   diazepam  (VALIUM ) 5 MG tablet Take 5 mg by mouth every 6 (six) hours as needed.   HYDROcodone -acetaminophen  (NORCO/VICODIN) 5-325 MG tablet Take 1 tablet by mouth every 4 (four) hours as needed.   psyllium (METAMUCIL SMOOTH TEXTURE) 28 % packet Take 1 packet by mouth 2 (two) times daily. (Patient not taking: Reported on 11/23/2022)   Misc Natural Products (PROSTATE HEALTH PO) Take 250 mg by mouth in the morning and at bedtime. Super Beta Prostate   Omega-3-6-9 CAPS Take 1 capsule by mouth 2 (two) times daily.   vitamin B-12 (CYANOCOBALAMIN) 500 MCG tablet Take 500 mcg by mouth daily.   Vitamin D3 (VITAMIN D) 25 MCG tablet Take 1,000 Units by mouth daily.   sertraline  (ZOLOFT ) 50 MG tablet TAKE 1 TABLET BY MOUTH EVERY DAY   calcium -vitamin D (OSCAL WITH D) 500-5 MG-MCG tablet Take 1 tablet by mouth daily with breakfast.   amLODipine  (NORVASC ) 5 MG tablet TAKE 1 TABLET(5 MG) BY MOUTH DAILY  atenolol  (TENORMIN ) 50 MG tablet Take 1 tablet (50 mg total) by mouth daily.   potassium chloride  (KLOR-CON  M) 10 MEQ tablet Take 1 tablet (10 mEq total) by mouth 2 (two) times daily.   celecoxib  (CELEBREX ) 200 MG capsule Take 1 capsule (200 mg total) by mouth 2 (two) times daily.   methocarbamol  (ROBAXIN -750) 750 MG tablet Take 1 tablet (750 mg total) by mouth 4 (four) times daily.   chlorthalidone  (HYGROTON ) 25 MG tablet TAKE 1 TABLET(25 MG) BY MOUTH EVERY MORNING   atorvastatin  (LIPITOR) 40 MG tablet TAKE 1 TABLET(40 MG) BY MOUTH DAILY   rivaroxaban  (XARELTO ) 20 MG TABS tablet Take 1 tablet (20 mg total) by mouth daily with supper.     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 Tobacco products N quit 10 yrs ago b. ETOH  Y  last drink 2 weeks ago, had to cut some of it off as it got out of hand, was drinking 7-8 drinks on weekends c. Cannabis  Y last use in [employer] d. Cocaine N    e. Prescription Stimulants N   f. Methamphetamine N  g. Inhalants N   h. Sedative/sleeping pills N  i. Hallucinogens Y once years ago j. Street Opioids N    k. Prescription opioids Y denied misuse l. Other: specify (spice, K2, bath salts, etc.)  N    Any history of substance related:  Blackouts:  - Tremors:  - DUI: -  D/T's: -  seizures: -    FAMILY HISTORY  Family Psychiatric History (if known): per record review sister depression, brother hx alcohol abuse, father hx Alzheimer's, father hx alcohol abuse; son PTSD, hx drug abuse  MENTAL STATUS EXAM (MSE)  Mental Status Exam: General Appearance:  wearing pajama top and underwear, no pajama bottoms  Orientation:  Other:  alert to name, month, city, state  Memory:  Immediate;   Poor Recent;   Poor Remote;   Fair  Concentration:  Concentration: Fair  Recall:  Poor  Attention  Fair  Eye Contact:  Good  Speech:  Clear and Coherent  Language:  Fair  Volume:  Normal  Mood: euthymic  Affect:  Appropriate  Thought Process:  Disorganized and Irrelevant  Thought Content:  Illogical  Suicidal Thoughts:  No  Homicidal Thoughts:  No  Judgement:  Impaired  Insight:  Lacking  Psychomotor Activity:  Restlessness  Akathisia:  No  Fund of Knowledge:  Fair    Assets:  Manufacturing Systems Engineer Desire for  Improvement Financial Resources/Insurance Housing Social Support  Cognition:  Impaired,  Moderate  ADL's:  Impaired  AIMS (if indicated):       VITALS  Blood pressure 104/70, pulse 84, temperature 98.2 F (36.8 C), temperature source Oral, resp. rate 18, height 5' 10 (1.778 m), weight 103 kg, SpO2  94%.  LABS  Admission on 09/02/2023  Component Date Value Ref Range Status   WBC 09/02/2023 9.1  4.0 - 10.5 K/uL Final   RBC 09/02/2023 4.52  4.22 - 5.81 MIL/uL Final   Hemoglobin 09/02/2023 14.1  13.0 - 17.0 g/dL Final   HCT 98/93/7974 40.9  39.0 - 52.0 % Final   MCV 09/02/2023 90.5  80.0 - 100.0 fL Final   MCH 09/02/2023 31.2  26.0 - 34.0 pg Final   MCHC 09/02/2023 34.5  30.0 - 36.0 g/dL Final   RDW 98/93/7974 15.1  11.5 - 15.5 % Final   Platelets 09/02/2023 214  150 - 400 K/uL Final   nRBC 09/02/2023 0.0  0.0 - 0.2 % Final   Neutrophils Relative % 09/02/2023 68  % Final   Neutro Abs 09/02/2023 6.3  1.7 - 7.7 K/uL Final   Lymphocytes Relative 09/02/2023 18  % Final   Lymphs Abs 09/02/2023 1.6  0.7 - 4.0 K/uL Final   Monocytes Relative 09/02/2023 10  % Final   Monocytes Absolute 09/02/2023 0.9  0.1 - 1.0 K/uL Final   Eosinophils Relative 09/02/2023 3  % Final   Eosinophils Absolute 09/02/2023 0.2  0.0 - 0.5 K/uL Final   Basophils Relative 09/02/2023 1  % Final   Basophils Absolute 09/02/2023 0.1  0.0 - 0.1 K/uL Final   Immature Granulocytes 09/02/2023 0  % Final   Abs Immature Granulocytes 09/02/2023 0.02  0.00 - 0.07 K/uL Final   Performed at [facility], [address] [place], [place] [address]   Sodium 09/02/2023 138  135 - 145 mmol/L Final   Potassium 09/02/2023 2.8 (L)  3.5 - 5.1 mmol/L Final   Chloride 09/02/2023 96 (L)  98 - 111 mmol/L Final   CO2 09/02/2023 26  22 - 32 mmol/L Final   Glucose, Bld 09/02/2023 98  70 - 99 mg/dL Final   Glucose reference range applies only to samples taken after fasting for at least 8 hours.   BUN 09/02/2023 32 (H)  8 - 23 mg/dL  Final   Creatinine, Ser 09/02/2023 1.14  0.61 - 1.24 mg/dL Final   Calcium  09/02/2023 9.6  8.9 - 10.3 mg/dL Final   Total Protein 98/93/7974 7.2  6.5 - 8.1 g/dL Final   Albumin 98/93/7974 4.2  3.5 - 5.0 g/dL Final   AST 98/93/7974 25  15 - 41 U/L Final   ALT 09/02/2023 18  0 - 44 U/L Final   Alkaline Phosphatase 09/02/2023 86  38 - 126 U/L Final   Total Bilirubin 09/02/2023 2.1 (H)  0.0 - 1.2 mg/dL Final   GFR, Estimated 09/02/2023 >60  >60 mL/min Final   Comment: (NOTE) Calculated using the CKD-EPI Creatinine Equation (2021)    Anion gap 09/02/2023 16 (H)  5 - 15 Final   Performed at [facility], [address] [place], [place] [address]   Alcohol, Ethyl (B) 09/02/2023 <10  <10 mg/dL Final   Comment: (NOTE) Lowest detectable limit for serum alcohol is 10 mg/dL.  For medical purposes only. Performed at [facility], [address] [place], [place] [address]    Acetaminophen  (Tylenol ), Serum 09/02/2023 11  10 - 30 ug/mL Final   Comment: (NOTE) Therapeutic concentrations vary significantly. A range of 10-30 ug/mL  may be an effective concentration for many patients. However, some  are best treated at concentrations outside of this range. Acetaminophen  concentrations >150 ug/mL at 4 hours after ingestion  and >50 ug/mL at 12 hours  after ingestion are often associated with  toxic reactions.  Performed at [facility], [address] [place], [place] [address]    Salicylate Lvl 09/02/2023 <7.0 (L)  7.0 - 30.0 mg/dL Final   Performed at [facility], [address] [place], [place] [address]   Ammonia 09/02/2023 37 (H)  9 - 35 umol/L Final   Performed at [facility], [address] [place], [place] [address]   Color, Urine 09/02/2023 YELLOW (A)  YELLOW Final   APPearance 09/02/2023 CLEAR (A)  CLEAR Final   Specific Gravity, Urine 09/02/2023 1.019  1.005 - 1.030 Final   pH 09/02/2023 5.0  5.0 - 8.0 Final   Glucose, UA  09/02/2023 NEGATIVE  NEGATIVE mg/dL Final   Hgb urine dipstick 09/02/2023 NEGATIVE  NEGATIVE Final   Bilirubin Urine 09/02/2023 NEGATIVE  NEGATIVE Final   Ketones, ur 09/02/2023 NEGATIVE  NEGATIVE mg/dL Final   Protein, ur 98/93/7974 100 (A)  NEGATIVE mg/dL Final   Nitrite 98/93/7974 NEGATIVE  NEGATIVE Final   Leukocytes,Ua 09/02/2023 NEGATIVE  NEGATIVE Final   RBC / HPF 09/02/2023 0-5  0 - 5 RBC/hpf Final   WBC, UA 09/02/2023 0-5  0 - 5 WBC/hpf Final   Bacteria, UA 09/02/2023 NONE SEEN  NONE SEEN Final   Squamous Epithelial / HPF 09/02/2023 0  0 - 5 /HPF Final   Mucus 09/02/2023 PRESENT   Final   Hyaline Casts, UA 09/02/2023 PRESENT   Final   Performed at [facility], [address] [place], [place] [address]   Tricyclic, Ur Screen 09/02/2023 POSITIVE (A)  NONE DETECTED Final   Amphetamines, Ur Screen 09/02/2023 NONE DETECTED  NONE DETECTED Final   MDMA (Ecstasy)Ur Screen 09/02/2023 NONE DETECTED  NONE DETECTED Final   Cocaine Metabolite,Ur [place] 09/02/2023 NONE DETECTED  NONE DETECTED Final   Opiate, Ur Screen 09/02/2023 POSITIVE (A)  NONE DETECTED Final   Phencyclidine (PCP) Ur S 09/02/2023 NONE DETECTED  NONE DETECTED Final   Cannabinoid 50 Ng, Ur [place] 09/02/2023 NONE DETECTED  NONE DETECTED Final   Barbiturates, Ur Screen 09/02/2023 NONE DETECTED  NONE DETECTED Final   Benzodiazepine, Ur Scrn 09/02/2023 POSITIVE (A)  NONE DETECTED Final   Methadone Scn, Ur 09/02/2023 NONE DETECTED  NONE DETECTED Final   Comment: (NOTE) Tricyclics + metabolites, urine    Cutoff 1000 ng/mL Amphetamines + metabolites, urine  Cutoff 1000 ng/mL MDMA (Ecstasy), urine              Cutoff 500 ng/mL Cocaine Metabolite, urine          Cutoff 300 ng/mL Opiate + metabolites, urine        Cutoff 300 ng/mL Phencyclidine (PCP), urine         Cutoff 25 ng/mL Cannabinoid, urine                 Cutoff 50 ng/mL Barbiturates + metabolites, urine  Cutoff 200 ng/mL Benzodiazepine, urine              Cutoff 200  ng/mL Methadone, urine                   Cutoff 300 ng/mL  The urine drug screen provides only a preliminary, unconfirmed analytical test result and should not be used for non-medical purposes. Clinical consideration and professional judgment should be applied to any positive drug screen result due to possible interfering substances. A more specific alternate chemical method must be used in order to obtain a confirmed analytical  result. Gas chromatography / mass spectrometry (GC/MS) is the preferred confirm                          atory method. Performed at [facility], [address], [place], [place] [address]     PSYCHIATRIC REVIEW OF SYSTEMS (ROS)    Additional findings:      Musculoskeletal: No abnormal movements observed      Gait & Station: Laying/Sitting      Pain Screening: Denies        RISK FORMULATION/ASSESSMENT  Is the patient experiencing any suicidal or homicidal ideations: No       Explain if yes:  Protective factors considered for safety management:  Absence of psychosis Access to adequate health care Advice& help seeking Children Sense of responsibility Life Satisfaction Positive coping skills Positive social support: Positive therapeutic relationship Future oriented Suicide Inquiry:  Denies suicidal ideations, intentions, or plans.  Denies  recent self-harm behavior. Talks futuristically.  Risk factors/concerns considered for safety management:  Depression Age over 81 Male gender  Is there a safety management plan with the patient and treatment team to minimize risk factors and promote protective factors: Yes           Explain: safety precautions Is crisis care placement or psychiatric hospitalization recommended: Yes  daughter is requesting hospitalization for safety as her mother cannot take care of him.       Based on my current evaluation and risk assessment, patient is determined at this time to be at:  High risk of non  intentional injury to self and others related to cognitive decline  *RISK ASSESSMENT Risk assessment is a dynamic process; it is possible that this patient's condition, and risk level, may change. This should be re-evaluated and managed over time as appropriate. Please re-consult psychiatric consult services if additional assistance is needed in terms of risk assessment and management. If your team decides to discharge this patient, please advise the patient how to best access emergency psychiatric services, or to call 911, if their condition worsens or they feel unsafe in any way.  Total time spent in this encounter was 60 minutes with greater than 50% of time spent in counseling and coordination of care.     Dr. Ezzard JUDITHANN Ada, PhD, MSN, APRN, PMHNP-BC, MCJ  Rina Adney  KANDICE Ada, NP Telepsychiatry Consult Services

## 2023-09-03 NOTE — ED Notes (Signed)
 Patient intermittently confused. Frequently coming out of room requesting belongings to be returned. Redirectable.

## 2023-09-03 NOTE — ED Notes (Signed)
 Patient extremely argumentative towards Clinical research associate. Demanded to speak with a physician. Confused on why he is in the hospital. States he has been treated unfairly. Writer attempted to explain and patient growing increasingly agitated.

## 2023-09-03 NOTE — ED Notes (Addendum)
 RN called  [name] Daughter Emergency Contact [phone]  to request most recent list of medications. Daughter and wife confirm that wife has accurate list of medications. RN sent secure chat to request pharmacy tech to call number of wife and complete med rec. Updated family on psych recommendation of admission.

## 2023-09-03 NOTE — ED Notes (Signed)
[facility] pending psych inpatient 

## 2023-09-03 NOTE — ED Notes (Signed)
Pt given PM snack at this time.  

## 2023-09-03 NOTE — ED Notes (Signed)
 Lunch tray provided to pt.

## 2023-09-04 DIAGNOSIS — Z79899 Other long term (current) drug therapy: Secondary | ICD-10-CM

## 2023-09-04 MED ORDER — DIAZEPAM 5 MG PO TABS
2.5000 mg | ORAL_TABLET | Freq: Four times a day (QID) | ORAL | Status: DC | PRN
  Administered 2023-09-04 – 2023-09-06 (×2): 2.5 mg via ORAL
  Filled 2023-09-04 (×2): qty 1

## 2023-09-04 NOTE — Consult Note (Addendum)
 [facility] Psychiatric Consult Initial  Patient Name: .Louis Luna  MRN: [id number]  DOB: 1951/04/17  Consult Order details:  Orders (From admission, onward)     Start     Ordered   09/02/23 1941  IP CONSULT TO PSYCHIATRY       Ordering Provider: Malvina Alm DASEN, MD  Provider:  (Not yet assigned)  Question Answer Comment  Place call to: psych   Reason for Consult Admit   Diagnosis/Clinical Info for Consult: dementia, worsening mental status, IVC      09/02/23 1940   09/02/23 1941  CONSULT TO CALL ACT TEAM       Ordering Provider: Malvina Alm DASEN, MD  Provider:  (Not yet assigned)  Question:  Reason for Consult?  Answer:  Psych consult   09/02/23 1940             Mode of Visit: In person, I spent on this consult, with 15 min with my supervisor Dr. Victoria.    Psychiatry Consult Evaluation  Service Date: September 04, 2023 LOS:  LOS: 0 days  Chief Complaint  I do not know why I am here I should not be here.  Primary Psychiatric Diagnoses  Mild cognitive impairment 2.  Polypharmacy   Assessment  Louis Luna is a 73 y.o. male admitted: Presented to the EDfor 09/02/2023  3:27 PM for altered mental status. He carries the psychiatric diagnoses of polypharmacy and mild cognitive impairment and has a past medical history of mild neurocognitive disorder with psychotic disturbances back pain, hyperlipidemia, hypertension.  His current presentation of altered mental status is most consistent with IVC report of being found wandering outside of his home, confusion, previous reports from family regarding behavior. He meets criteria for polypharmacy based on medical reconciliation showing benzodiazepines, muscle relaxers, pain medications.  Current outpatient psychotropic medications include amlodipine , atenolol , add of a statin, Celebrex , chlorthalidone , cyclobenzaprine , diazepam , Norco, methocarbamol , Xarelto , and sertraline  and historically he has had a therapeutic  response to these medications.  Patient is a poor historian and unable to recall what medication he last took. on initial examination, patient pleasant and laughing during the interview, actively participating. Please see plan below for detailed recommendations.   Diagnoses:  Active Hospital problems: Active Problems:   Mild major neurocognitive disorder due to unknown etiology, with psychotic disturbance (HCC)   Polypharmacy    Plan   ## Psychiatric Medication Recommendations:  - Decrease Valium  to 2.5 mg 3 times daily as needed.   -Recommend for patient to follow-up with primary care physician with medication reconciliation to reevaluate pain management.  ## Medical Decision Making Capacity: Not specifically addressed in this encounter  ## Further Work-up:  -- No further workup recommended  -- Pertinent labwork reviewed earlier this admission includes: CMP, CBC, urine drug screen, urinalysis.   ## Disposition:-- There are no psychiatric contraindications to discharge at this time - Recommend referring patient to [facility] to determine placement. -Recommend to follow-up with PCP to reevaluate medication management for pain management. -Dr. Dickey instructed to resend IVC.  ## Behavioral / Environmental: -Utilize compassion and acknowledge the patient's experiences while setting clear and realistic expectations for care.    ## Safety and Observation Level:  - Based on my clinical evaluation, I estimate the patient to be at low risk of self harm in the current setting. - At this time, we recommend  routine. This decision is based on my review of the chart including patient's history and current presentation, interview of  the patient, mental status examination, and consideration of suicide risk including evaluating suicidal ideation, plan, intent, suicidal or self-harm behaviors, risk factors, and protective factors. This judgment is based on our ability to directly address suicide risk,  implement suicide prevention strategies, and develop a safety plan while the patient is in the clinical setting. Please contact our team if there is a concern that risk level has changed.  CSSR Risk Category:C-SSRS RISK CATEGORY: No Risk  Suicide Risk Assessment: Patient has following modifiable risk factors for suicide: recklessness and lack of access to outpatient mental health resources, which we are addressing by referring to [facility]. Patient has following non-modifiable or demographic risk factors for suicide: male gender Patient has the following protective factors against suicide: Supportive family  Thank you for this consult request. Recommendations have been communicated to the primary team.  We will Clear from psych at this time and recommended for TOC to seek placement at this time.   Dorn Jama Der, NP       History of Present Illness  Relevant Aspects of Hospital ED Course:  Admitted on 09/02/2023 for altered mental status. They currently standing up outside the doorway asking to leave but is redirectable and participating in interview.   Patient Report:  73 year old male coming in to the emergency department due to being found wandering outside of his home by the police, which resulted in the IVC for his safety.  Patient is being evaluated for altered mental status with known diagnosis of mild cognitive impairment within the last several months at his primary care doctor.  Patient is stating that he does not need to be here and does not cannot recall why he was brought into the emergency department or able to recall who brought him.  He is unable to recall the year unable to recall the situation but oriented to self.  He was recently evaluated for back pain on August 30, 2023, which he was prescribed Norco 5-325 mg tablets 1 tablet by mouth every 4 hours as needed for pain up to 5 days.  On medication reconciliation it is found that he is also taking Flexeril  10 mg tablets 3 times  daily, diazepam  5 mg tablets every 6 hours as needed with no clarification of as needed, methocarbamol  1 tablet by mouth 4 times daily, with his sertraline  50 mg tablet once daily.  While speaking with the patient patient was easily distracted and stating that he just wanted to $100 million A he is can split it with everyone and that I should feel better about myself.  He also states that he is on parole and needs to call his parole officer.  Collateral from family collected by [place]  Georgina, NP states a month and a half ago in her opinion he went  downhill with his confusion.  Daughter reports he began to have visual hallucinations, started seeing dogs, and he thought his wife was his mother, thought his daughter was still young upstairs in the bedroom.  He was found to have some alcohol in which he was not supposed to be drinking, and was later found by his wife passed out behind the wheel and garage.  Due to the patient being currently evaluated for mild cognitive impairment, it is recommended for the primary care to continue seeking referrals for the patient's concern for cognitive impairment and to reevaluate medications given for his pain management.  He does not meet inpatient admission criteria and is recommended to be referred to [facility] for placement  due to safety.  Dr. Dickey has instructed to rescind IVC.  According to family spouse cognitive has cognitive impairment.  This case was shared with my supervising psychiatrist Dr. Dickey.  Collateral Report from [phone] Daughter reports that she is trying to find memory care unit with no success.  The spouse is stating that the patient cannot return home because of safety reasons.  She is concerned for the patient be able to return home which it was explained that he was referred to transitional care and that they will follow-up with her soon.  She endorses that the patient has had cognitive impairment and is currently being evaluated with appointments  later in late January to be evaluated for possible dementia.  She does endorse that this was sudden happening within several months, and that they are trying their best to get all the appointments to be able to take better care of their dad.  She states that she is going to keep an eye on her dad and that she is willing to take him home if the behavior gets better, but does not feel safe as of now due to his elopement risk.  She was also educated on the medications that the patient is receiving and encouraged to check his home meds to be sure that he does not have medications that were not prescribed to him, or previously just prescribed.  Family member was educated on how to reach out to [facility] health for updates on their father.  No further questions or concerns at this time from family.  At this time, she is stating she does not feel safe bringing her dad home. ToC orders are in for consult for placement.   Psych ROS:  Depression: Denies Anxiety:  Denies Mania (lifetime and current): Denies Psychosis: (lifetime and current): Won a lottery and going to split the winnings, on parole and needs to call the officers.  Review of Systems  Constitutional: Negative.   HENT: Negative.    Eyes: Negative.   Respiratory: Negative.    Cardiovascular: Negative.   Gastrointestinal: Negative.   Genitourinary: Negative.   Musculoskeletal:  Positive for back pain and myalgias.  Skin: Negative.   Psychiatric/Behavioral:  Positive for memory loss.      Psychiatric and Social History  Psychiatric History:  Information collected from Patient  Prev Dx/Sx: Mild cognitive impairment Current Psych Provider: Denies Home Meds (current): Amlodipine , atenolol , atorvastatin , Celebrex , [place] , cyclobenzaprine , diazepam , Norco, methocarbamol , Xarelto , sertraline . Previous Med Trials: None noted Therapy: Denies  Prior Psych Hospitalization: Denies  Prior Self Harm: Denies Prior Violence: Denies  Family Psych  History: Denies Family Hx suicide: Denies  Social History:  Developmental Hx: Denies Educational Hx: 16 years Occupational Hx: Former Solicitor Hx: IVC in place Living Situation: Lives at home with spouse, who also has cognitive impairment  Access to weapons/lethal means: Denies  Substance History Alcohol: Denies Tobacco: Reports having a vape and uses daily Illicit drugs: Denies  Exam Findings   Vital Signs:  Temp:  [98.2 F (36.8 C)-98.3 F (36.8 C)] 98.2 F (36.8 C) (01/08 1015) Pulse Rate:  [85-99] 99 (01/08 1015) Resp:  [18-20] 20 (01/08 1015) BP: (114-129)/(85-97) 129/85 (01/08 1015) SpO2:  [94 %-95 %] 94 % (01/08 1015) Blood pressure 129/85, pulse 99, temperature 98.2 F (36.8 C), temperature source Oral, resp. rate 20, height 5' 10 (1.778 m), weight 103 kg, SpO2 94%. Body mass index is 32.58 kg/m.  Physical Exam HENT:     Head: Normocephalic.  Nose: Nose normal.     Mouth/Throat:     Mouth: Mucous membranes are moist.  Cardiovascular:     Rate and Rhythm: Normal rate.  Pulmonary:     Effort: Pulmonary effort is normal.  Musculoskeletal:        General: Normal range of motion.     Cervical back: Normal range of motion.  Skin:    General: Skin is warm and dry.  Neurological:     General: No focal deficit present.     Mental Status: He is alert.  Psychiatric:        Mood and Affect: Mood normal.     Mental Status Exam: General Appearance: Disheveled  Orientation:  Full (Time, Place, and Person)  Memory:  Immediate;   Poor Recent;   Poor Remote;   Poor  Concentration:  Concentration: Fair and Attention Span: Fair  Recall:  Poor  Attention  Fair  Eye Contact:  Good  Speech:  Clear and Coherent  Language:  Poor  Volume:  Normal  Mood: Euthymic  Affect:  Appropriate  Thought Process:  Disorganized  Thought Content:  Illogical  Suicidal Thoughts:  No  Homicidal Thoughts:  No  Judgement:  Impaired  Insight:  Lacking  Psychomotor  Activity:  Normal  Akathisia:  No  Fund of Knowledge:  Poor      Assets:  Communication Skills Housing  Cognition:  WNL  ADL's:  Intact  AIMS (if indicated):        Other History   These have been pulled in through the EMR, reviewed, and updated if appropriate.  Family History:  The patient's family history includes Alcohol abuse in his brother and father; Alzheimer's disease in his father; Breast cancer in his mother, sister, and sister; Depression in his sister and sister; Diabetes in his mother; Hypertension in his brother, brother, father, and mother; Melanoma in his sister.  Medical History: Past Medical History:  Diagnosis Date   Depression    GERD (gastroesophageal reflux disease)    H/O elevated lipids    HLD (hyperlipidemia)    Hypertension    Personal history of tobacco use, presenting hazards to health 01/26/2016   Sleep apnea     Surgical History: Past Surgical History:  Procedure Laterality Date   ANKLE SURGERY     APPENDECTOMY     CARDIOVERSION N/A 09/11/2021   Procedure: CARDIOVERSION;  Surgeon: Darliss Rogue, MD;  Location: [facility] ORS;  Service: Cardiovascular;  Laterality: N/A;   COLONOSCOPY N/A 03/23/2016   Procedure: COLONOSCOPY;  Surgeon: Lamar ONEIDA Holmes, MD;  Location: [facility] ENDOSCOPY;  Service: Endoscopy;  Laterality: N/A;   HERNIA REPAIR     inguinal-left 04/1979   QUADRICEPS TENDON REPAIR Left 05/05/2015   Procedure: REPAIR QUADRICEP TENDON;  Surgeon: Ozell Flake, MD;  Location: [facility] ORS;  Service: Orthopedics;  Laterality: Left;   TUMOR EXCISION     from Rt side of the neck-benign     Medications:   Current Facility-Administered Medications:    amLODipine  (NORVASC ) tablet 5 mg, 5 mg, Oral, Daily, Jessup, Charles, MD, 5 mg at 09/04/23 1021   atenolol  (TENORMIN ) tablet 50 mg, 50 mg, Oral, Daily, Jessup, Charles, MD, 50 mg at 09/04/23 1021   atorvastatin  (LIPITOR) tablet 40 mg, 40 mg, Oral, Daily, Jessup, Charles, MD, 40 mg at 09/04/23  1021   chlorthalidone  (HYGROTON ) tablet 25 mg, 25 mg, Oral, Daily, Jessup, Charles, MD, 25 mg at 09/04/23 1021   diazepam  (VALIUM ) tablet 2.5 mg, 2.5 mg, Oral, Q6H  PRN, Jamiesha Victoria, Dorn Ruth, NP   HYDROcodone -acetaminophen  (NORCO/VICODIN) 5-325 MG per tablet 1 tablet, 1 tablet, Oral, Q4H PRN, Jessup, Charles, MD, 1 tablet at 09/04/23 1243   methylPREDNISolone  acetate (DEPO-MEDROL ) injection 40 mg, 40 mg, Intramuscular, Once, Emilio Marseille T, FNP   rivaroxaban  (XARELTO ) tablet 20 mg, 20 mg, Oral, Q supper, Jessup, Charles, MD, 20 mg at 09/03/23 1706   sertraline  (ZOLOFT ) tablet 50 mg, 50 mg, Oral, Daily, Jessup, Charles, MD, 50 mg at 09/04/23 1021  Current Outpatient Medications:    amLODipine  (NORVASC ) 5 MG tablet, TAKE 1 TABLET(5 MG) BY MOUTH DAILY, Disp: 90 tablet, Rfl: 3   atenolol  (TENORMIN ) 50 MG tablet, Take 1 tablet (50 mg total) by mouth daily., Disp: 90 tablet, Rfl: 3   atorvastatin  (LIPITOR) 40 MG tablet, TAKE 1 TABLET(40 MG) BY MOUTH DAILY, Disp: 90 tablet, Rfl: 3   celecoxib  (CELEBREX ) 200 MG capsule, Take 1 capsule (200 mg total) by mouth 2 (two) times daily., Disp: 30 capsule, Rfl: 0   chlorthalidone  (HYGROTON ) 25 MG tablet, TAKE 1 TABLET(25 MG) BY MOUTH EVERY MORNING, Disp: 90 tablet, Rfl: 3   diazepam  (VALIUM ) 5 MG tablet, Take 5 mg by mouth every 6 (six) hours as needed., Disp: , Rfl:    HYDROcodone -acetaminophen  (NORCO/VICODIN) 5-325 MG tablet, Take 1 tablet by mouth every 4 (four) hours as needed., Disp: , Rfl:    Misc Natural Products (PROSTATE HEALTH PO), Take 250 mg by mouth in the morning and at bedtime. Super Beta Prostate, Disp: , Rfl:    potassium chloride  (KLOR-CON  M) 10 MEQ tablet, Take 1 tablet (10 mEq total) by mouth 2 (two) times daily., Disp: 180 tablet, Rfl: 3   rivaroxaban  (XARELTO ) 20 MG TABS tablet, Take 1 tablet (20 mg total) by mouth daily with supper., Disp: 30 tablet, Rfl: 5   sertraline  (ZOLOFT ) 50 MG tablet, TAKE 1 TABLET BY MOUTH EVERY DAY, Disp: 90  tablet, Rfl: 3   vitamin B-12 (CYANOCOBALAMIN) 500 MCG tablet, Take 500 mcg by mouth daily., Disp: , Rfl:    Vitamin D3 (VITAMIN D) 25 MCG tablet, Take 1,000 Units by mouth daily., Disp: , Rfl:    calcium -vitamin D (OSCAL WITH D) 500-5 MG-MCG tablet, Take 1 tablet by mouth daily with breakfast. (Patient not taking: Reported on 09/03/2023), Disp: , Rfl:    cyclobenzaprine  (FLEXERIL ) 10 MG tablet, Take 10 mg by mouth 3 (three) times daily. (Patient not taking: Reported on 09/03/2023), Disp: , Rfl:    methocarbamol  (ROBAXIN -750) 750 MG tablet, Take 1 tablet (750 mg total) by mouth 4 (four) times daily. (Patient not taking: Reported on 09/03/2023), Disp: 30 tablet, Rfl: 0   Omega-3-6-9 CAPS, Take 1 capsule by mouth 2 (two) times daily. (Patient not taking: Reported on 09/03/2023), Disp: , Rfl:    psyllium (METAMUCIL SMOOTH TEXTURE) 28 % packet, Take 1 packet by mouth 2 (two) times daily. (Patient not taking: Reported on 11/23/2022), Disp: , Rfl:   Allergies: No Known Allergies  Dorn Ruth Der, NP

## 2023-09-04 NOTE — ED Notes (Signed)
IVC/Pending Inpt Admit  

## 2023-09-04 NOTE — Consult Note (Signed)
 Per Dr. Victoria, attending psychiatrist, IVC to be rescinded. Pt seen at bedside. Pt denies suicidal, homicidal ideations. Denies auditory visual hallucinations or paranoia. Pt is pleasant, cooperative, with bright affect. IVC rescinded. TOC has been consulted for SNF placement.

## 2023-09-04 NOTE — ED Notes (Addendum)
 Pt requested to brush teeth, and also requested a shower. This tech & NT [place] assisted Pt in having shower and brushing teeth. Pt supplied with new pair of underwear, socks and scrubs.

## 2023-09-04 NOTE — ED Notes (Signed)
 PT  VOL  PENDING  TOC  CONSULT

## 2023-09-04 NOTE — ED Notes (Signed)
 IVC PAPERS  RESCINDED  PER  DR  Roxan Hockey  MD

## 2023-09-04 NOTE — ED Notes (Signed)
 Patient became very agitated and clenching his fist towards staff for not being able to leave. [employer] officer and this RN attempted to reorient patient as to why he was here and why we were waiting. Patient stated [employer] are gonna pay big time for this. Unable to reorient patient at this time. PRN anxiety medication given at this time due to increased pacing and agitation

## 2023-09-04 NOTE — ED Provider Notes (Signed)
 Emergency Medicine Observation Re-evaluation Note  Physical Exam   BP (!) 114/97   Pulse 85   Temp 98.3 F (36.8 C) (Oral)   Resp 18   Ht 5' 10 (1.778 m)   Wt 103 kg   SpO2 95%   BMI 32.58 kg/m   Patient appears in no acute distress.  ED Course / MDM   No reported events during my shift at the time of this note.   Pt is awaiting dispo from consultants   Ginnie Shams MD    Shams Ginnie, MD 09/04/23 [phone]

## 2023-09-04 NOTE — ED Notes (Signed)
 Hospital meal provided.  100% consumed, pt tolerated w/o complaints.  Waste discarded appropriately.

## 2023-09-04 NOTE — ED Notes (Signed)
 Dinner tray provided; Waste disposed of properly

## 2023-09-05 ENCOUNTER — Emergency Department: Payer: Medicare HMO

## 2023-09-05 DIAGNOSIS — G319 Degenerative disease of nervous system, unspecified: Secondary | ICD-10-CM | POA: Diagnosis not present

## 2023-09-05 DIAGNOSIS — I6782 Cerebral ischemia: Secondary | ICD-10-CM | POA: Diagnosis not present

## 2023-09-05 NOTE — ED Provider Notes (Signed)
 Family reports that the patient's neurologist whom they have been in communication with would like to obtain an MRI of the brain without contrast.  This is due to his confusion.  I have ordered MRI brain without contrast family affirms to nursing staff he does not have any pacemaker/implant   Dicky Anes, MD 09/05/23 1921

## 2023-09-05 NOTE — ED Provider Notes (Signed)
 Emergency Medicine Observation Re-evaluation Note  Louis Luna is a 73 y.o. male, seen on rounds today.  Pt initially presented to the ED for complaints of Altered Mental Status and IVC Currently, the patient is resting, voices no medical complaints.  Physical Exam  BP 126/74 (BP Location: Left Arm)   Pulse 81   Temp 98.4 F (36.9 C) (Oral)   Resp 18   Ht 5' 10 (1.778 m)   Wt 103 kg   SpO2 98%   BMI 32.58 kg/m  Physical Exam General: Resting in no acute distress Cardiac: No cyanosis Lungs: Equal rise and fall Psych: Not agitated  ED Course / MDM  EKG:   I have reviewed the labs performed to date as well as medications administered while in observation.  Recent changes in the last 24 hours include no events overnight.  Plan  Current plan is for psychiatric disposition.    Kynslei Art J, MD 09/05/23 [phone]

## 2023-09-05 NOTE — Evaluation (Signed)
 Occupational Therapy Evaluation Patient Details Name: Louis Luna MRN: [id number] DOB: 06-06-1951 Today's Date: 09/05/2023   History of Present Illness Pt is a 73 y.o. male presenting to hospital 09/02/23 with c/o AMS (pt reportedly took the car yesterday evening and was out all night per family); worsening AMS over past several days; recent diagnosis of dementia; police called and placed under IVC (now rescinded).  PMH includes htn, sleep apnea, ankle sx, L quadriceps tendon repair, hernia repair.   Clinical Impression   Chart reviewed, pt greeted with tech getting a shower, agreeable to OT evaluation. PTA pt reports he is generally MOD I in Adl/IADL, including driving. Noted ongoing workup for cognitive impairment with referral to neurology per PCP notes. Pt unable to report any medicines he takes. He is oriented to self, place, not oriented to date and some confusion regarding current situation. Impaired attention and safety awareness on this date.  Pt is pleasant however and does report he was driving but can't remember where he went or how long he was gone fore before being brought to the hospital. He also reports increased urinary urgency resulting in difficulties getting to the bathroom. Pt performs ADL grossly at a supervision level, with MIN A for LB dressing. He amb 100' without AD with supervision. OT will follow on a trial basis to further assess functional cognition, medication management ADL performance. Recommend supervision for medication management at this time.       If plan is discharge home, recommend the following: Assistance with cooking/housework;Direct supervision/assist for medications management;Direct supervision/assist for financial management;Assist for transportation    Functional Status Assessment  Patient has had a recent decline in their functional status and demonstrates the ability to make significant improvements in function in a reasonable and predictable  amount of time.  Equipment Recommendations  None recommended by OT    Recommendations for Other Services       Precautions / Restrictions Precautions Precautions: Fall      Mobility Bed Mobility Overal bed mobility: Needs Assistance Bed Mobility: Sit to Supine       Sit to supine: Modified independent (Device/Increase time)        Transfers Overall transfer level: Needs assistance   Transfers: Sit to/from Stand Sit to Stand: Modified independent (Device/Increase time)                  Balance Overall balance assessment: Needs assistance Sitting-balance support: Feet supported Sitting balance-Leahy Scale: Good     Standing balance support: No upper extremity supported Standing balance-Leahy Scale: Good                             ADL either performed or assessed with clinical judgement   ADL Overall ADL's : Needs assistance/impaired Eating/Feeding: Sitting;Modified independent   Grooming: Sitting;Modified independent   Upper Body Bathing: Modified independent;Supervision/ safety;Standing Upper Body Bathing Details (indicate cue type and reason): in shower Lower Body Bathing: Modified independent;Supervison/ safety Lower Body Bathing Details (indicate cue type and reason): in shower Upper Body Dressing : Modified independent   Lower Body Dressing: Minimal assistance Lower Body Dressing Details (indicate cue type and reason): due to large scrub pants Toilet Transfer: Ambulation;Supervision/safety;Contact guard assist Toilet Transfer Details (indicate cue type and reason): simulated with no AD Toileting- Clothing Manipulation and Hygiene: Modified independent   Tub/ Shower Transfer: Walk-in shower;Supervision/safety;Contact guard assist;Ambulation   Functional mobility during ADLs: Supervision/safety (approx 75' no AD)  Vision Baseline Vision/History: 1 Wears glasses Patient Visual Report: No change from baseline        Perception         Praxis         Pertinent Vitals/Pain Pain Assessment Pain Assessment: No/denies pain     Extremity/Trunk Assessment Upper Extremity Assessment Upper Extremity Assessment: Overall WFL for tasks assessed   Lower Extremity Assessment Lower Extremity Assessment: LLE deficits/detail LLE Deficits / Details: baseline LLE deficits- pt reports no change       Communication Communication Communication: No apparent difficulties Cueing Techniques: Verbal cues   Cognition Arousal: Alert Behavior During Therapy: WFL for tasks assessed/performed Overall Cognitive Status: Impaired/Different from baseline Area of Impairment: Orientation, Attention, Memory, Following commands, Safety/judgement, Awareness, Problem solving                 Orientation Level: Disoriented to, Time (some confusion regarding current situation, but does report police came to his house and brought him to [facility]) Current Attention Level: Sustained Memory: Decreased short-term memory Following Commands: Follows one step commands consistently Safety/Judgement: Decreased awareness of deficits, Decreased awareness of safety Awareness: Intellectual Problem Solving: Decreased initiation, Difficulty sequencing, Requires verbal cues General Comments: pleasant and agreeable     General Comments  vss throughout    Exercises Other Exercises Other Exercises: edu re: role of OT, role of rehab   Shoulder Instructions      Home Living Family/patient expects to be discharged to:: Private residence Living Arrangements: Spouse/significant other Available Help at Discharge: Family;Available PRN/intermittently Type of Home: House Home Access: Stairs to enter [employer] of Steps: 3-4 Entrance Stairs-Rails: Right;Left Home Layout: Two level;Able to live on main level with bedroom/bathroom     Bathroom Shower/Tub: Chief Strategy Officer: Standard     Home Equipment: Cane  - single point;Grab bars - tub/shower          Prior Functioning/Environment Prior Level of Function : Independent/Modified Independent;Driving             Mobility Comments: pt reports he amb with a cane, does not report falls ADLs Comments: pt reports MOD I-I in ADL, PRN assist for donning socks/shoes; pt reports he manages his own medication, drives, cooks, cleans; will need to confirm        OT Problem List: Decreased activity tolerance;Impaired balance (sitting and/or standing);Decreased cognition      OT Treatment/Interventions: Self-care/ADL training;DME and/or AE instruction;Therapeutic activities;Balance training;Therapeutic exercise;Patient/family education;Cognitive remediation/compensation    OT Goals(Current goals can be found in the care plan section) Acute Rehab OT Goals Patient Stated Goal: get home OT Goal Formulation: With patient Time For Goal Achievement: 09/19/23 Potential to Achieve Goals: Good ADL Goals Pt Will Transfer to Toilet: with modified independence Additional ADL Goal #1: Pt will improve IADL of med management as evidenced by participating in pill box assessment in order to further assess performance and provide compensatory techniques for improvements in task  OT Frequency: Min 1X/week    Co-evaluation              AM-PAC OT 6 Clicks Daily Activity     Outcome Measure Help from another person eating meals?: None Help from another person taking care of personal grooming?: None Help from another person toileting, which includes using toliet, bedpan, or urinal?: None Help from another person bathing (including washing, rinsing, drying)?: None Help from another person to put on and taking off regular upper body clothing?: None Help from another person to put on  and taking off regular lower body clothing?: A Little 6 Click Score: 23   End of Session Nurse Communication: Mobility status  Activity Tolerance: Patient tolerated treatment  well Patient left: in bed;with call bell/phone within reach  OT Visit Diagnosis: Other abnormalities of gait and mobility (R26.89);Cognitive communication deficit (R41.841)                Time: 8852-8798 OT Time Calculation (min): 14 min Charges:  OT General Charges $OT Visit: 1 Visit OT Evaluation $OT Eval Low Complexity: 1 Low  Therisa Sheffield, OTD OTR/L  09/05/23, 1:55 PM

## 2023-09-05 NOTE — ED Notes (Signed)
 Fresh bed linens placed on bed.

## 2023-09-05 NOTE — ED Notes (Signed)
 Pt provided with breakfast tray.

## 2023-09-05 NOTE — Evaluation (Signed)
 Physical Therapy Evaluation Patient Details Name: Louis Luna MRN: [id number] DOB: 06-07-1951 Today's Date: 09/05/2023  History of Present Illness  Pt is a 73 y.o. male presenting to hospital 09/02/23 with c/o AMS (pt reportedly took the car yesterday evening and was out all night per family); worsening AMS over past several days; recent diagnosis of dementia; police called and placed under IVC (now rescinded).  PMH includes htn, sleep apnea, ankle sx, L quadriceps tendon repair, hernia repair.  Clinical Impression  Prior to recent medical concerns, pt reports being modified independent with ambulation (uses a 3 prong cane for walking d/t chronic R>L hip pain and LBP); pt denies any recent falls; lives with his wife.  Pt oriented to person, hospital, and general reason he is at the hospital.  Pt reported it was December 2055; generalized confusion noted during session; pt appearing tangential intermittently; occasionally appearing distractible; and requiring extra time to process or answer questions at times.  Currently pt is independent with transfers and ambulation (no AD use).  No loss of balance noted with sessions activities.  No acute PT needs identified; will sign off.    If plan is discharge home, recommend the following: Supervision due to cognitive status;Assist for transportation   Can travel by private vehicle        Equipment Recommendations None recommended by PT  Recommendations for Other Services       Functional Status Assessment Patient has not had a recent decline in their functional status     Precautions / Restrictions Restrictions Weight Bearing Restrictions Per Provider Order: No      Mobility  Bed Mobility               General bed mobility comments: Deferred (pt reporting no difficulties with bed mobility)    Transfers Overall transfer level: Independent Equipment used: None               General transfer comment: steady transfer from  bed    Ambulation/Gait Ambulation/Gait assistance: Independent Gait Distance (Feet): 120 Feet Assistive device: None Gait Pattern/deviations: Step-through pattern, Antalgic       General Gait Details: wider BOS; mild increased B lateral sway; mild decreased stance time R LE; no loss of balance noted  Stairs            Wheelchair Mobility     Tilt Bed    Modified Rankin (Stroke Patients Only)       Balance Overall balance assessment: Independent (No loss of balance with sitting or standing/walking activities during session.)                                           Pertinent Vitals/Pain Pain Assessment Pain Assessment: Faces Faces Pain Scale: Hurts little more Pain Location: chronic R>L hip pain and LBP Pain Descriptors / Indicators: Aching, Discomfort Pain Intervention(s): Limited activity within patient's tolerance, Monitored during session    Home Living Family/patient expects to be discharged to:: Private residence Living Arrangements: Spouse/significant other Available Help at Discharge: Family;Available PRN/intermittently Type of Home: House Home Access: Stairs to enter Entrance Stairs-Rails: Right;Left Entrance Stairs-Number of Steps: 3-4   Home Layout: Two level;Able to live on main level with bedroom/bathroom Home Equipment: Cane - single point;Grab bars - tub/shower      Prior Function Prior Level of Function : Independent/Modified Independent  Mobility Comments: Pt reports no recent falls.  Pt reports using 3 prong cane for walking d/t R>L hip pain and LBP (chronic)       Extremity/Trunk Assessment   Upper Extremity Assessment Upper Extremity Assessment: Overall WFL for tasks assessed    Lower Extremity Assessment Lower Extremity Assessment: Overall WFL for tasks assessed    Cervical / Trunk Assessment Cervical / Trunk Assessment: Other exceptions Cervical / Trunk Exceptions: forward  head/shoulders  Communication   Communication Communication: No apparent difficulties Cueing Techniques: Verbal cues  Cognition Arousal: Alert Behavior During Therapy: WFL for tasks assessed/performed Overall Cognitive Status: No family/caregiver present to determine baseline cognitive functioning                                 General Comments: Oriented to person, hospital, and general reason he is in the hospital.  Pt reported it was December 2055; generalized confusion noted during session.        General Comments  Nursing cleared pt for participation in physical therapy.  Pt agreeable to PT session.  Pt up walking in his room (independently) upon PT arrival.    Exercises     Assessment/Plan    PT Assessment Patient does not need any further PT services  PT Problem List         PT Treatment Interventions      PT Goals (Current goals can be found in the Care Plan section)  Acute Rehab PT Goals Patient Stated Goal: to go home PT Goal Formulation: With patient Time For Goal Achievement: 09/19/23 Potential to Achieve Goals: Good    Frequency       Co-evaluation               AM-PAC PT 6 Clicks Mobility  Outcome Measure Help needed turning from your back to your side while in a flat bed without using bedrails?: None Help needed moving from lying on your back to sitting on the side of a flat bed without using bedrails?: None Help needed moving to and from a bed to a chair (including a wheelchair)?: None Help needed standing up from a chair using your arms (e.g., wheelchair or bedside chair)?: None Help needed to walk in hospital room?: None Help needed climbing 3-5 steps with a railing? : None 6 Click Score: 24    End of Session   Activity Tolerance: Patient tolerated treatment well Patient left:  (Sitting on EOB) Nurse Communication: Mobility status;Precautions;Other (comment) (Pt requesting his glasses and to call his wife) PT Visit  Diagnosis: Muscle weakness (generalized) (M62.81)    Time: 9155-9094 PT Time Calculation (min) (ACUTE ONLY): 21 min   Charges:   PT Evaluation $PT Eval Low Complexity: 1 Low   PT General Charges $$ ACUTE PT VISIT: 1 Visit        Damien Caulk, PT 09/05/23, 9:31 AM

## 2023-09-05 NOTE — ED Notes (Signed)
 Family at bedside with patient.

## 2023-09-05 NOTE — ED Notes (Signed)
 VOL/ TOC

## 2023-09-05 NOTE — ED Provider Notes (Signed)
 MR BRAIN WO CONTRAST Result Date: 09/05/2023 CLINICAL DATA:  Initial evaluation for acute altered mental status. EXAM: MRI HEAD WITHOUT CONTRAST TECHNIQUE: Multiplanar, multiecho pulse sequences of the brain and surrounding structures were obtained without intravenous contrast. COMPARISON:  CT from 09/02/2023. FINDINGS: Brain: Mild age-related cerebral atrophy. Patchy T2/FLAIR hyperintensity involving the periventricular deep white matter both cerebral hemispheres, most characteristic of chronic microvascular ischemic disease, moderate to advanced in nature. No evidence for acute or subacute ischemia. Gray-white matter differentiation maintained. No recent chronic cortical infarction. No acute intracranial hemorrhage. Few scattered chronic micro hemorrhages noted, most pronounced about the occipital lobes bilaterally, likely hypertensive in nature. No mass lesion, midline shift or mass effect. No hydrocephalus or extra-axial fluid collection. Pituitary gland suprasellar region within normal limits. Vascular: Major intracranial vascular flow voids are maintained. Skull and upper cervical spine: Cranial junctional limits. Bone marrow signal intensity normal. No scalp soft tissue abnormality. Sinuses/Orbits: Globes orbital soft tissues within normal limits. Paranasal sinuses are largely clear. No mastoid effusion. Other: None. IMPRESSION: 1. No acute intracranial abnormality. 2. Age-related cerebral atrophy with moderate to advanced chronic microvascular ischemic disease. Electronically Signed   By: Morene Hoard M.D.   On: 09/05/2023 21:04      Dicky Anes, MD 09/06/23 1103

## 2023-09-05 NOTE — ED Notes (Signed)
pt recieved snack and drink 

## 2023-09-05 NOTE — ED Notes (Signed)
 VOL  TOC  CONSULT

## 2023-09-06 NOTE — ED Notes (Signed)
 VOL/Pending D/C placement SNF

## 2023-09-06 NOTE — ED Provider Notes (Signed)
 Emergency Medicine Observation Re-evaluation Note  Louis Luna is a 73 y.o. male, seen on rounds today.  Pt initially presented to the ED for complaints of Altered Mental Status and IVC  Currently, the patient is is no acute distress. Denies any concerns at this time.  Physical Exam  Blood pressure (!) 133/97, pulse 96, temperature 97.7 F (36.5 C), temperature source Oral, resp. rate 18, height 5' 10 (1.778 m), weight 103 kg, SpO2 95%.  Physical Exam: General: No apparent distress Pulm: Normal WOB Neuro: Moving all extremities Psych: Resting comfortably     ED Course / MDM   I have reviewed the labs performed to date as well as medications administered while in observation.  Recent changes in the last 24 hours include: No acute events overnight.  Plan   Current plan: Patient awaiting social work dispo for snf.  Patient is not under full IVC at this time.    Suzanne Kirsch, MD 09/06/23 [phone]

## 2023-09-06 NOTE — ED Notes (Signed)
 Pt ambulated to bathroom to preform ADL's no assistance required

## 2023-09-06 NOTE — TOC CM/SW Note (Signed)
 CSW attempted call to patient's spouse to discuss DC planning. Left a VM requesting a return call.  Alfonso Ramus, LCSW Transitions of Care Department [phone]

## 2023-09-06 NOTE — ED Notes (Signed)
 Pt provided with lunch tray.

## 2023-09-06 NOTE — ED Notes (Signed)
 Patient has granddaughter visiting at this time.

## 2023-09-06 NOTE — ED Notes (Signed)
 Hospital meal provided.  20% consumed, pt tolerated w/o complaints.  Waste discarded appropriately.

## 2023-09-06 NOTE — ED Notes (Signed)
Pt refused breakfast at this time.

## 2023-09-06 NOTE — ED Notes (Signed)
 Patient becoming slightly agitated because he wants to leave and check on his mother.  Patient reassured that everything will be ok and that he will be find sleeping here for the night.

## 2023-09-06 NOTE — ED Notes (Signed)
Patient received dinner tray at this time.  

## 2023-09-06 NOTE — ED Notes (Signed)
 Pt showered without assistance. New scrubs, underwear, and no-skid socks given. Pt has no other requests at this time.

## 2023-09-07 MED ORDER — OLANZAPINE 10 MG PO TABS
10.0000 mg | ORAL_TABLET | ORAL | Status: AC
  Administered 2023-09-07: 10 mg via ORAL
  Filled 2023-09-07: qty 1

## 2023-09-07 MED ORDER — ZIPRASIDONE MESYLATE 20 MG IM SOLR: 20.0000 mg | Freq: Once | INTRAMUSCULAR | Status: DC

## 2023-09-07 NOTE — ED Notes (Signed)
Patient is vol pending placement 

## 2023-09-07 NOTE — ED Notes (Signed)
 Pt demanding to see his wife, staff explained that she was not in the hospital but would be willing to call her.  Pt became agitated.  He stated that we were holding him here illegally and he would like to leave.  Pt requesting his car keys. Pt arrived via [employer], pt becoming more agitated, ED MD notified of patient behavior(s)

## 2023-09-07 NOTE — ED Provider Notes (Signed)
-----------------------------------------   7:10 AM on 09/07/2023 -----------------------------------------   Blood pressure 119/79, pulse 96, temperature 97.9 F (36.6 C), resp. rate 20, height 1.778 m (5' 10), weight 103 kg, SpO2 94%.  The patient is calm and cooperative at this time.  However, overnight he became acutely agitated and required frequent redirection.  He was trying to leave in the middle of the blizzard and eventually he was calm and and redirected and placed into a room.   Gordan Huxley, MD 09/07/23 [phone]

## 2023-09-07 NOTE — ED Notes (Signed)
 Vol/pending D/C placement SNF

## 2023-09-07 NOTE — ED Notes (Signed)
 Woke pt to administer medications, pt refused, will attempt again later

## 2023-09-07 NOTE — ED Notes (Signed)
 Pt gave verbal permission to speak with his daughter Arliss Journey @ [phone]

## 2023-09-07 NOTE — Consult Note (Addendum)
 Patient noted sitting on his bed requesting to speak with this clinical research associate. Patient is alert and oriented to x 2, reporting that the year is 2001 and not understanding the reason for his ED presentation. Patient previously recommended for SNF placement. TOC consult previously ordered by an alternate provider. He denies SI/HI/AVH.

## 2023-09-07 NOTE — ED Notes (Signed)
 Patient up into hallway without clothing and into bathroom.  Patient had been incontinent of urine in bed.  Patient given clean scrubs and clean linen.

## 2023-09-07 NOTE — ED Notes (Signed)
 Pt took PO medication

## 2023-09-07 NOTE — ED Notes (Signed)
 This RN has been speaking with the patient for over 20 minutes attempting to reorient him to the fact that he is in the Emergency Room and that it is 2:00 am.  Patient is becoming agitates and telling this RN and security that he is going to leave and that we can not stop him.  RN and security speaking calmly attempting to reorient him to place, time and situation. Dr Gordan came an also attempt to speak with patient.

## 2023-09-07 NOTE — ED Notes (Signed)
 Patient moved to rm 22 for more comfortable environment.

## 2023-09-08 LAB — BASIC METABOLIC PANEL
Anion gap: 14 (ref 5–15)
BUN: 26 mg/dL — ABNORMAL HIGH (ref 8–23)
CO2: 31 mmol/L (ref 22–32)
Calcium: 9.7 mg/dL (ref 8.9–10.3)
Chloride: 95 mmol/L — ABNORMAL LOW (ref 98–111)
Creatinine, Ser: 1.29 mg/dL — ABNORMAL HIGH (ref 0.61–1.24)
GFR, Estimated: 59 mL/min — ABNORMAL LOW (ref >60)
Glucose, Bld: 106 mg/dL — ABNORMAL HIGH (ref 70–99)
Potassium: 3.1 mmol/L — ABNORMAL LOW (ref 3.5–5.1)
Sodium: 140 mmol/L (ref 135–145)

## 2023-09-08 NOTE — ED Notes (Signed)
 Pt has been calm and cooperative thus far this shift, Louis Luna is confused at times however he is easily directable.  Pt's family at bedside for a visit currently. Cont to monitor as ordered

## 2023-09-08 NOTE — ED Notes (Signed)
 Hospital meal provided, pt tolerated w/o complaints.  Waste discarded appropriately.

## 2023-09-08 NOTE — ED Provider Notes (Signed)
-----------------------------------------   3:24 AM on 09/08/2023 -----------------------------------------   Blood pressure (!) 103/38, pulse 88, temperature 98 F (36.7 C), resp. rate (!) 22, height 5' 10 (1.778 m), weight 103 kg, SpO2 92%.  The patient is calm and cooperative at this time.  There have been no acute events since the last update.  Awaiting disposition plan from case management/social work.    Roschelle Calandra, Josette SAILOR, DO 09/08/23 [phone]

## 2023-09-08 NOTE — ED Notes (Signed)
 Pt provided with lunch tray. Pt sitting up eating.

## 2023-09-08 NOTE — TOC Initial Note (Signed)
 Transition of Care [facility]) - Initial/Assessment Note    Patient Details  Name: Louis Luna MRN: [id number] Date of Birth: 12-08-1950  Transition of Care [facility]) CM/SW Contact:    Louis Luna, Louis Luna Phone Number: 09/08/2023, 3:52 PM  Clinical Narrative:                 CSW spoke to patient's daughter Louis Luna, [phone] to discuss plan for discharge. Per patient's daughter, patient resides with spouse with daughter actively assisting with care, however due to spouse's medical condition, she is unable to provide ongoing care for patient. Placement process discussed. Patient's daughter actively looking for placement for patient, due to income and assets would likely not qualify for LTC Medicaid and would need to privately pay for placement. Per patient's daughter, patient has a bed offer at [facility], however due to cost would likely not be able to afford this expense. Daughter meeting with an attorney tomorrow to apply for POA for patient and her mother.  Alternative options discussed including contacting [facility] [phone] for placement assistance or [facility] [phone]. Patient's daughter agreeable to contacting one of these agencies for any possible assistance for memory care placement. Patient's daughter aware that patient may not be able to remain in the hospital until placed, discussed options for private duty care as well.  Patient's daughter will plan to contact [facility] today to discuss placement assistance options.  Transition of Care to continue to follow  Louis Pincock, LCSW Transition of Care    Expected Discharge Plan: Long Term Nursing Home Barriers to Discharge: Continued Medical Work up   Patient Goals and CMS Choice            Expected Discharge Plan and Services       Living arrangements for the past 2 months: Single Family Home                                      Prior Living Arrangements/Services Living  arrangements for the past 2 months: Single Family Home Lives with:: Adult Children, Spouse                   Activities of Daily Living      Permission Sought/Granted                  Emotional Assessment   Attitude/Demeanor/Rapport: Unable to Assess Affect (typically observed): Unable to Assess   Alcohol / Substance Use: Not Applicable Psych Involvement: Yes (comment)  Admission diagnosis:  IVC Patient Active Problem List   Diagnosis Date Noted   Polypharmacy 09/04/2023   Mild major neurocognitive disorder due to unknown etiology, with psychotic disturbance (HCC) 09/03/2023   Acute left-sided low back pain without sciatica 12/26/2022   Screening for prostate cancer 10/17/2022   Medicare annual wellness visit, subsequent 10/17/2022   Weight gain finding 10/17/2022   Elevated serum glucose 10/17/2022   Lung cancer screening declined by patient 10/17/2022   Secondary hypercoagulable state (HCC) 10/17/2022   Aortic atherosclerosis (HCC) 10/17/2022   OSA and COPD overlap syndrome (HCC) 10/17/2022   Depression, recurrent (HCC) 10/17/2022   Annual physical exam 10/17/2022   Morbid obesity (HCC) 10/17/2022   Persistent atrial fibrillation (HCC)    Personal history of tobacco use, presenting hazards to health 01/26/2016   HLD (hyperlipidemia) 01/27/2015   BP (high blood pressure) 01/27/2015   PCP:  Louis Charlie CROME, MD Pharmacy:   [id number] - [place], [place] - [address] AT [facility] [address] [place] [address] Phone: [phone] Fax: [phone]     Social Drivers of Health (SDOH) Social History: SDOH Screenings   Alcohol Screen: Low Risk  (12/26/2022)  Depression (PHQ2-9): Low Risk  (12/26/2022)  Tobacco Use: Medium Risk (09/02/2023)   SDOH Interventions:     Readmission Risk Interventions     No data to display

## 2023-09-08 NOTE — ED Notes (Signed)
 Vitals taken. Pt refused snack at this time.

## 2023-09-08 NOTE — ED Notes (Signed)
VOL/Pending placement 

## 2023-09-09 NOTE — ED Notes (Signed)
 This tech offered pt a shower. Pt refused a shower at this time.

## 2023-09-09 NOTE — ED Notes (Signed)
Pt received dinner tray.

## 2023-09-09 NOTE — ED Notes (Signed)
 Family at bedside along with pt advocate.

## 2023-09-09 NOTE — ED Notes (Signed)
Pt given PM snack at this time.  

## 2023-09-09 NOTE — ED Provider Notes (Signed)
-----------------------------------------   6:50 AM on 09/09/2023 -----------------------------------------   Blood pressure (!) 127/55, pulse 92, temperature 98.2 F (36.8 C), temperature source Oral, resp. rate 16, height 5' 10 (1.778 m), weight 103 kg, SpO2 97%.  The patient is calm and cooperative at this time.  There have been no acute events since the last update.  Awaiting disposition plan from Social Work team.   Thana Ramp J, MD 09/09/23 [phone]

## 2023-09-09 NOTE — ED Notes (Addendum)
 Spoke with patient and his granddaughter. Pt reports wanting to be involved in his plan of care. Explained to patient that we are looking for a facility for the patient to take care of him. At this time his family is unable to care for him and they would like more resources to help take care of him. Pt asking many questions. Pt becoming agitated and upset. Encouraged patient to spend time with his granddaughter while she was here. Pt agreed.

## 2023-09-10 NOTE — ED Notes (Signed)
 Pt ambulated to bathroom to preform ADL's no assistance required

## 2023-09-10 NOTE — ED Notes (Signed)
 Daughter in with pt.  PR rebecca with visitor and security at [facility].  Pt calm and cooperative.

## 2023-09-10 NOTE — ED Provider Notes (Signed)
 Emergency Medicine Observation Re-evaluation Note  Physical Exam   BP 113/73 (BP Location: Left Arm)   Pulse 90   Temp 98 F (36.7 C) (Oral)   Resp 20   Ht 5' 10 (1.778 m)   Wt 103 kg   SpO2 95%   BMI 32.58 kg/m   Patient appears in no acute distress.  ED Course / MDM   No reported events during my shift at the time of this note.   Pt is awaiting dispo from consultants   Ginnie Shams MD    Shams Ginnie, MD 09/10/23 [phone]

## 2023-09-10 NOTE — ED Notes (Signed)
Hospital meal provided.  50% consumed, pt tolerated w/o complaints.  Waste discarded appropriately.

## 2023-09-10 NOTE — ED Notes (Signed)
TOC placement 

## 2023-09-11 MED ORDER — DIAZEPAM 5 MG PO TABS: 2.5000 mg | ORAL_TABLET | Freq: Three times a day (TID) | ORAL | 0 refills | Status: AC | PRN

## 2023-09-11 MED ORDER — POTASSIUM CHLORIDE CRYS ER 20 MEQ PO TBCR
40.0000 meq | EXTENDED_RELEASE_TABLET | Freq: Once | ORAL | Status: AC
  Administered 2023-09-11: 40 meq via ORAL
  Filled 2023-09-11: qty 2

## 2023-09-11 NOTE — ED Provider Notes (Signed)
 Notified by RN that patient has been evaluated by social work and was cleared for discharge with family.  Patient's daughter did arrive and had questions about patient's disposition plan.  I did review his chart and evaluated the patient. I discussed that his testing here was without emergent findings and that he did not meet criteria for inpatient medical admission and he has been cleared by her psychiatry team.  Social work has provided outpatient resources.  She expressed understanding with plan for discharge and outpatient follow-up.  Patient discharged in stable condition.   Claria Crofts, MD 09/11/23 [phone]

## 2023-09-11 NOTE — ED Notes (Signed)
 Dr ray spoke with daughter and patient.  D/c inst to daughter.  Pt calm and cooperative

## 2023-09-11 NOTE — ED Notes (Signed)
vol/toc placement.. 

## 2023-09-11 NOTE — Discharge Instructions (Addendum)
 You were seen in the emergency department for your confusion and abnormal behavior.  Your testing was for the overall reassuring.  Please follow-up with your primary care doctor and neurologist for further evaluation.  Return to the ER for new or worsening symptoms.

## 2023-09-11 NOTE — ED Notes (Signed)
 Daughter in with pt for a visit.  Dr ray aware.

## 2023-09-11 NOTE — ED Notes (Signed)
 Report received from Amy, RN.

## 2023-09-11 NOTE — ED Notes (Signed)
 Pt awake and incontinent of urine, ambulated to bathroom and clean and dried with standby assistance at this time.

## 2023-09-11 NOTE — TOC Progression Note (Signed)
 Transition of Care [facility]) - Progression Note    Patient Details  Name: Louis Luna MRN: [id number] Date of Birth: [birth year]  Transition of Care [facility]) CM/SW Contact  Tilmon Font, [place] Phone Number: 09/11/2023, 5:10 PM  Clinical Narrative:     CSW completed chart review and made contact with pt's daughter to discuss discharge planning. Dtr reports she met with her lawyer for POA, but was informed she would have to seek guardianship. She further reports she toured [address], but they will not be able to pay for placement until [facility] is in place because they will have to get access to pt's 401K. CSW explained emergency guardianship could take up to 3 weeks and discussed the process. CSW informed pt's dtr pt is medically stable, per doctor's notes, and ready for discharge. Daughter was concerned about her father driving off and having to call emergency services and also hallucinating. CSW explained the family will need to put safety measures in place at home until placement is sought, I.e. taking car keys, using baby monitors, using bells on doors in the home to know what room pt is in, providing supervision. She further expressed concerns about hallucinations, stating her Dad thought the recliner was a motorcycle. CSW informed her this could be followed up on an OPT level. Dtr asked about dx. CSW informed her dx is altered mental status and mild dementia w/o bx disturbance. Dtr became increasingly upset stating she wanted to speak with a doctor and wanted his MRI read. CSW unformed her there was no longer an attending assigned to him, but she could speak with the doctor over ED. At this point, daughter became irate and CSW ended the phone call. CSW updated Pattie Borders, Press photographer.   Expected Discharge Plan: Long Term Nursing Home Barriers to Discharge: Continued Medical Work up  Expected Discharge Plan and Services       Living arrangements for the past 2 months: Single Family Home                                        Social Determinants of Health (SDOH) Interventions SDOH Screenings   Alcohol Screen: Low Risk  (12/26/2022)  Depression (PHQ2-9): Low Risk  (12/26/2022)  Tobacco Use: Medium Risk (09/02/2023)    Readmission Risk Interventions     No data to display

## 2023-09-11 NOTE — ED Provider Notes (Signed)
-----------------------------------------   5:32 AM on 09/11/2023 -----------------------------------------   Blood pressure 115/77, pulse 98, temperature 97.7 F (36.5 C), temperature source Oral, resp. rate 17, height 5\' 10"  (1.778 m), weight 103 kg, SpO2 97%.  The patient is calm and cooperative at this time.  There have been no acute events since the last update.  Awaiting disposition plan from Social Work team.   Vivan Agostino J, MD 09/11/23 [phone]

## 2023-09-11 NOTE — ED Notes (Signed)
 Dinner tray given to pt

## 2023-09-12 ENCOUNTER — Telehealth: Payer: Self-pay

## 2023-09-12 NOTE — Transitions of Care (Post Inpatient/ED Visit) (Signed)
   09/12/2023  Name: Louis Luna MRN: [id number] DOB: 1950/08/29  Today's TOC FU Call Status: Today's TOC FU Call Status:: Unsuccessful Call (1st Attempt) Unsuccessful Call (1st Attempt) Date: 09/12/23  Attempted to reach the patient regarding the most recent Inpatient/ED visit.  Follow Up Plan: Additional outreach attempts will be made to reach the patient to complete the Transitions of Care (Post Inpatient/ED visit) call.   Signature Karena Addison, LPN [facility] Nurse Health Advisor Direct Dial [phone]

## 2023-09-13 DIAGNOSIS — G8929 Other chronic pain: Secondary | ICD-10-CM | POA: Diagnosis not present

## 2023-09-13 DIAGNOSIS — M25552 Pain in left hip: Secondary | ICD-10-CM | POA: Diagnosis not present

## 2023-09-13 DIAGNOSIS — I4819 Other persistent atrial fibrillation: Secondary | ICD-10-CM | POA: Diagnosis not present

## 2023-09-13 DIAGNOSIS — J449 Chronic obstructive pulmonary disease, unspecified: Secondary | ICD-10-CM | POA: Diagnosis not present

## 2023-09-13 DIAGNOSIS — F339 Major depressive disorder, recurrent, unspecified: Secondary | ICD-10-CM | POA: Diagnosis not present

## 2023-09-13 DIAGNOSIS — G3184 Mild cognitive impairment, so stated: Secondary | ICD-10-CM | POA: Diagnosis not present

## 2023-09-13 DIAGNOSIS — G4733 Obstructive sleep apnea (adult) (pediatric): Secondary | ICD-10-CM | POA: Diagnosis not present

## 2023-09-17 NOTE — Transitions of Care (Post Inpatient/ED Visit) (Signed)
   09/17/2023  Name: Louis Luna MRN: [id number] DOB: [birth year]  Today's TOC FU Call Status: Today's TOC FU Call Status:: Successful TOC FU Call Completed Unsuccessful Call (1st Attempt) Date: 09/12/23 [facility] FU Call Complete Date: 09/17/23 Patient's Name and Date of Birth confirmed.  Transition Care Management Follow-up Telephone Call Date of Discharge: 09/11/23 Discharge Facility: [facility]) Type of Discharge: Emergency Department Reason for ED Visit: Other: (disorientation) How have you been since you were released from the hospital?: Better  Items Reviewed: Did you receive and understand the discharge instructions provided?: No Medications obtained,verified, and reconciled?: Yes (Medications Reviewed) Any new allergies since your discharge?: No Dietary orders reviewed?: Yes Do you have support at home?: Yes People in Home: spouse  Medications Reviewed Today: Medications Reviewed Today   Medications were not reviewed in this encounter     Home Care and Equipment/Supplies: Were Home Health Services Ordered?: NA Any new equipment or medical supplies ordered?: NA  Functional Questionnaire: Do you need assistance with bathing/showering or dressing?: No Do you need assistance with meal preparation?: No Do you need assistance with eating?: No Do you have difficulty maintaining continence: No Do you need assistance with getting out of bed/getting out of a chair/moving?: No Do you have difficulty managing or taking your medications?: No  Follow up appointments reviewed: PCP Follow-up appointment confirmed?: No (transferred to [place])    SIGNATURE Karena Addison, LPN [facility] Nurse Health Advisor Direct Dial [phone]

## 2023-09-19 DIAGNOSIS — G3184 Mild cognitive impairment, so stated: Secondary | ICD-10-CM | POA: Diagnosis not present

## 2023-10-14 ENCOUNTER — Ambulatory Visit: Payer: Medicare HMO | Admitting: Urology

## 2023-10-15 DIAGNOSIS — M545 Low back pain, unspecified: Secondary | ICD-10-CM | POA: Diagnosis not present

## 2023-10-15 DIAGNOSIS — G4733 Obstructive sleep apnea (adult) (pediatric): Secondary | ICD-10-CM | POA: Diagnosis not present

## 2023-10-15 DIAGNOSIS — I4819 Other persistent atrial fibrillation: Secondary | ICD-10-CM | POA: Diagnosis not present

## 2023-10-15 DIAGNOSIS — G3184 Mild cognitive impairment, so stated: Secondary | ICD-10-CM | POA: Diagnosis not present

## 2023-10-15 DIAGNOSIS — F339 Major depressive disorder, recurrent, unspecified: Secondary | ICD-10-CM | POA: Diagnosis not present

## 2023-10-15 DIAGNOSIS — J449 Chronic obstructive pulmonary disease, unspecified: Secondary | ICD-10-CM | POA: Diagnosis not present

## 2023-10-15 DIAGNOSIS — E7800 Pure hypercholesterolemia, unspecified: Secondary | ICD-10-CM | POA: Diagnosis not present

## 2023-10-15 DIAGNOSIS — I10 Essential (primary) hypertension: Secondary | ICD-10-CM | POA: Diagnosis not present

## 2023-10-21 ENCOUNTER — Ambulatory Visit: Payer: Medicare HMO | Admitting: Urology

## 2023-12-02 ENCOUNTER — Ambulatory Visit: Payer: Medicare HMO | Admitting: Urology

## 2023-12-03 ENCOUNTER — Encounter: Payer: Self-pay | Admitting: Urology

## 2023-12-31 DIAGNOSIS — G939 Disorder of brain, unspecified: Secondary | ICD-10-CM | POA: Diagnosis not present

## 2023-12-31 DIAGNOSIS — F0280 Dementia in other diseases classified elsewhere without behavioral disturbance: Secondary | ICD-10-CM | POA: Diagnosis not present

## 2023-12-31 DIAGNOSIS — G309 Alzheimer's disease, unspecified: Secondary | ICD-10-CM | POA: Diagnosis not present

## 2023-12-31 DIAGNOSIS — F0150 Vascular dementia without behavioral disturbance: Secondary | ICD-10-CM | POA: Diagnosis not present

## 2024-01-10 ENCOUNTER — Encounter: Payer: Self-pay | Admitting: Nurse Practitioner

## 2024-01-10 ENCOUNTER — Ambulatory Visit: Attending: Nurse Practitioner | Admitting: Nurse Practitioner

## 2024-01-10 NOTE — Progress Notes (Deleted)
 Office Visit    Patient Name: Louis Luna Date of Encounter: 01/10/2024  Primary Care Provider:  Nikki Barters, MD Primary Cardiologist:  Louis Delton, MD  Chief Complaint    73 y.o. male with a history of permanent atrial fibrillation status post failed cardioversion in January 2023, hypertension, hyperlipidemia, GERD, depression, sleep apnea, and remote tobacco abuse, who presents for follow-up of atrial fibrillation.  Past Medical History   Subjective   Past Medical History:  Diagnosis Date   Depression    GERD (gastroesophageal reflux disease)    H/O elevated lipids    History of echocardiogram    a. 05/2024 Echo: EF 50 to 55% with normal RV function and mildly dilated left atrium.   HLD (hyperlipidemia)    Hypertension    Permanent atrial fibrillation (HCC)    a. 04/2021 Afib dx - asymptomatic-->xarelto  (CHA2DS2VASc = 2); b. 08/2021 s/p DCCV-->return of Afib-->rate control strategy.   Personal history of tobacco use, presenting hazards to health 01/26/2016   Sleep apnea    Past Surgical History:  Procedure Laterality Date   ANKLE SURGERY     APPENDECTOMY     CARDIOVERSION N/A 09/11/2021   Procedure: CARDIOVERSION;  Surgeon: Louis Delton, MD;  Location: [facility] ORS;  Service: Cardiovascular;  Laterality: N/A;   COLONOSCOPY N/A 03/23/2016   Procedure: COLONOSCOPY;  Surgeon: Louis Click, MD;  Location: [facility] ENDOSCOPY;  Service: Endoscopy;  Laterality: N/A;   HERNIA REPAIR     inguinal-left 04/1979   QUADRICEPS TENDON REPAIR Left 05/05/2015   Procedure: REPAIR QUADRICEP TENDON;  Surgeon: Louis Angelucci, MD;  Location: [facility] ORS;  Service: Orthopedics;  Laterality: Left;   TUMOR EXCISION     from Rt side of the neck-benign    Allergies  No Known Allergies     History of Present Illness      73 y.o. y/o male with a history of permanent atrial fibrillation, hypertension, hyperlipidemia, GERD, depression, sleep apnea, and remote tobacco abuse.   He was previously diagnosed with atrial fibrillation in September 2022 when a routine, preoperative ECG showed A-fib without acute ST or T changes.  He was asymptomatic.  He was placed on Xarelto  and maintained on beta-blocker therapy.  An echocardiogram in October 2022 showed an EF of 50 to 55% with normal RV function and mildly dilated left atrium.  Cardioversion was performed in January 2023 however, he had return of asymptomatic atrial fibrillation shortly thereafter.  He was evaluated by Dr. Marven Luna in March 2023 and decision was made for ongoing rate control.   [name] was last seen in cardiology clinic in March 2020 for at which time he was doing well.  In January 2025, he was seen in the emergency department with altered mental status, felt to be mild Alzheimer's with vascular dementia, likely exacerbated by medication misuse.  MRI showed extensive microvascular ischemic changes and he has since done well. Objective   Home Medications    Current Outpatient Medications  Medication Sig Dispense Refill   amLODipine  (NORVASC ) 5 MG tablet TAKE 1 TABLET(5 MG) BY MOUTH DAILY 90 tablet 3   atenolol  (TENORMIN ) 50 MG tablet Take 1 tablet (50 mg total) by mouth daily. 90 tablet 3   atorvastatin  (LIPITOR) 40 MG tablet TAKE 1 TABLET(40 MG) BY MOUTH DAILY 90 tablet 3   calcium -vitamin D (OSCAL WITH D) 500-5 MG-MCG tablet Take 1 tablet by mouth daily with breakfast. (Patient not taking: Reported on 09/03/2023)     celecoxib  (CELEBREX )  200 MG capsule Take 1 capsule (200 mg total) by mouth 2 (two) times daily. 30 capsule 0   chlorthalidone  (HYGROTON ) 25 MG tablet TAKE 1 TABLET(25 MG) BY MOUTH EVERY MORNING 90 tablet 3   cyclobenzaprine  (FLEXERIL ) 10 MG tablet Take 10 mg by mouth 3 (three) times daily. (Patient not taking: Reported on 09/03/2023)     diazepam  (VALIUM ) 5 MG tablet Take 0.5 tablets (2.5 mg total) by mouth every 8 (eight) hours as needed. 30 tablet 0   methocarbamol  (ROBAXIN -750) 750 MG tablet  Take 1 tablet (750 mg total) by mouth 4 (four) times daily. (Patient not taking: Reported on 09/03/2023) 30 tablet 0   Misc Natural Products (PROSTATE HEALTH PO) Take 250 mg by mouth in the morning and at bedtime. Super Beta Prostate     Omega-3-6-9 CAPS Take 1 capsule by mouth 2 (two) times daily. (Patient not taking: Reported on 09/03/2023)     potassium chloride  (KLOR-CON  M) 10 MEQ tablet Take 1 tablet (10 mEq total) by mouth 2 (two) times daily. 180 tablet 3   psyllium (METAMUCIL SMOOTH TEXTURE) 28 % packet Take 1 packet by mouth 2 (two) times daily. (Patient not taking: Reported on 11/23/2022)     rivaroxaban  (XARELTO ) 20 MG TABS tablet Take 1 tablet (20 mg total) by mouth daily with supper. 30 tablet 5   sertraline  (ZOLOFT ) 50 MG tablet TAKE 1 TABLET BY MOUTH EVERY DAY 90 tablet 3   vitamin B-12 (CYANOCOBALAMIN) 500 MCG tablet Take 500 mcg by mouth daily.     Vitamin D3 (VITAMIN D) 25 MCG tablet Take 1,000 Units by mouth daily.     Current Facility-Administered Medications  Medication Dose Route Frequency Provider Last Rate Last Admin   methylPREDNISolone  acetate (DEPO-MEDROL ) injection 40 mg  40 mg Intramuscular Once Louis Becton, FNP         Physical Exam    VS:  There were no vitals taken for this visit. , BMI There is no height or weight on file to calculate BMI.       GEN: Well nourished, well developed, in no acute distress. HEENT: normal. Neck: Supple, no JVD, carotid bruits, or masses. Cardiac: RRR, no murmurs, rubs, or gallops. No clubbing, cyanosis, edema.  Radials 2+/PT 2+ and equal bilaterally.  Respiratory:  Respirations regular and unlabored, clear to auscultation bilaterally. GI: Soft, nontender, nondistended, BS + x 4. MS: no deformity or atrophy. Skin: warm and dry, no rash. Neuro:  Strength and sensation are intact. Psych: Normal affect.  Accessory Clinical Findings    ECG personally reviewed by me today -    *** - no acute changes.  Lab Results  Component  Value Date   WBC 9.1 09/02/2023   HGB 14.1 09/02/2023   HCT 40.9 09/02/2023   MCV 90.5 09/02/2023   PLT 214 09/02/2023   Lab Results  Component Value Date   CREATININE 1.29 (H) 09/08/2023   BUN 26 (H) 09/08/2023   NA 140 09/08/2023   K 3.1 (L) 09/08/2023   CL 95 (L) 09/08/2023   CO2 31 09/08/2023   Lab Results  Component Value Date   ALT 18 09/02/2023   AST 25 09/02/2023   ALKPHOS 86 09/02/2023   BILITOT 2.1 (H) 09/02/2023   Lab Results  Component Value Date   CHOL 144 10/17/2022   HDL 40 10/17/2022   LDLCALC 75 10/17/2022   TRIG 168 (H) 10/17/2022   CHOLHDL 3.6 10/17/2022    Lab Results  Component Value Date  HGBA1C 5.8 (H) 10/17/2022   Lab Results  Component Value Date   TSH 1.600 10/17/2022       Assessment & Plan    1.  ***  Laneta Pintos, NP 01/10/2024, 1:23 PM

## 2024-01-15 DIAGNOSIS — F339 Major depressive disorder, recurrent, unspecified: Secondary | ICD-10-CM | POA: Diagnosis not present

## 2024-01-15 DIAGNOSIS — I700 Atherosclerosis of aorta: Secondary | ICD-10-CM | POA: Diagnosis not present

## 2024-01-15 DIAGNOSIS — Z0000 Encounter for general adult medical examination without abnormal findings: Secondary | ICD-10-CM | POA: Diagnosis not present

## 2024-01-15 DIAGNOSIS — R7303 Prediabetes: Secondary | ICD-10-CM | POA: Diagnosis not present

## 2024-01-15 DIAGNOSIS — I10 Essential (primary) hypertension: Secondary | ICD-10-CM | POA: Diagnosis not present

## 2024-01-15 DIAGNOSIS — E7800 Pure hypercholesterolemia, unspecified: Secondary | ICD-10-CM | POA: Diagnosis not present

## 2024-01-15 DIAGNOSIS — Z1331 Encounter for screening for depression: Secondary | ICD-10-CM | POA: Diagnosis not present

## 2024-01-15 DIAGNOSIS — Z6837 Body mass index (BMI) 37.0-37.9, adult: Secondary | ICD-10-CM | POA: Diagnosis not present

## 2024-01-15 NOTE — Progress Notes (Signed)
 Office Visit    Patient Name: Louis Luna Date of Encounter: 01/17/2024  Primary Care Provider:  Nikki Barters, MD Primary Cardiologist:  Constancia Delton, MD  Chief Complaint    73 y.o. male with a history of permanent atrial fibrillation status post failed cardioversion in January 2023, hypertension, hyperlipidemia, GERD, depression, sleep apnea, and remote tobacco abuse, who presents for follow-up of atrial fibrillation.   Past Medical History   Subjective   Past Medical History:  Diagnosis Date   Depression    GERD (gastroesophageal reflux disease)    H/O elevated lipids    History of echocardiogram    a. 05/2021 Echo: EF 50 to 55% with normal RV function and mildly dilated left atrium.   HLD (hyperlipidemia)    Hypertension    Permanent atrial fibrillation (HCC)    a. 04/2021 Afib dx - asymptomatic-->xarelto  (CHA2DS2VASc = 2); b. 08/2021 s/p DCCV-->return of Afib-->rate control strategy.   Personal history of tobacco use, presenting hazards to health 01/26/2016   Sleep apnea    Past Surgical History:  Procedure Laterality Date   ANKLE SURGERY     APPENDECTOMY     CARDIOVERSION N/A 09/11/2021   Procedure: CARDIOVERSION;  Surgeon: Constancia Delton, MD;  Location: [facility] ORS;  Service: Cardiovascular;  Laterality: N/A;   COLONOSCOPY N/A 03/23/2016   Procedure: COLONOSCOPY;  Surgeon: Cassie Click, MD;  Location: [facility] ENDOSCOPY;  Service: Endoscopy;  Laterality: N/A;   HERNIA REPAIR     inguinal-left 04/1979   QUADRICEPS TENDON REPAIR Left 05/05/2015   Procedure: REPAIR QUADRICEP TENDON;  Surgeon: Molli Angelucci, MD;  Location: [facility] ORS;  Service: Orthopedics;  Laterality: Left;   TUMOR EXCISION     from Rt side of the neck-benign    Allergies  No Known Allergies     History of Present Illness      73 y.o. y/o male with a history of permanent atrial fibrillation, hypertension, hyperlipidemia, GERD, depression, sleep apnea, and remote tobacco abuse.   He was previously diagnosed with atrial fibrillation in September 2022 when a routine, preoperative ECG showed A-fib without acute ST or T changes.  He was asymptomatic.  He was placed on Xarelto  and maintained on beta-blocker therapy.  An echocardiogram in October 2022 showed an EF of 50 to 55% with normal RV function and mildly dilated left atrium.  Cardioversion was performed in January 2023 however, he had return of asymptomatic atrial fibrillation shortly thereafter.  He was evaluated by Dr. Marven Slimmer in March 2023 and decision was made for ongoing rate control.    [name] was last seen in cardiology clinic in March 2024 at which time he was doing well.  In January 2025, he was seen in the emergency department with altered mental status, felt to be mild Alzheimer's with vascular dementia, likely exacerbated by medication misuse.  MRI showed extensive microvascular ischemic changes.    Over the past year, [name] has generally felt well.  He is disappointed in his weight.  He notes he is very sedentary and would like to become more active.  Last year, he started noticing some dyspnea on exertion when mowing the lawn and so he hired a company to do it instead.  He does not experience chest pain and denies palpitations, PND, orthopnea, dizziness, syncope, or early satiety.  He has chronic, mild lower extremity swelling which he was told was due to venous insufficiency. Objective   Home Medications    Current Outpatient Medications  Medication Sig Dispense Refill   amLODipine  (NORVASC ) 5 MG tablet TAKE 1 TABLET(5 MG) BY MOUTH DAILY 90 tablet 3   atenolol  (TENORMIN ) 50 MG tablet Take 1 tablet (50 mg total) by mouth daily. 90 tablet 3   atorvastatin  (LIPITOR) 40 MG tablet TAKE 1 TABLET(40 MG) BY MOUTH DAILY 90 tablet 3   calcium -vitamin D (OSCAL WITH D) 500-5 MG-MCG tablet Take 1 tablet by mouth daily with breakfast.     chlorthalidone  (HYGROTON ) 25 MG tablet TAKE 1 TABLET(25 MG) BY MOUTH EVERY  MORNING 90 tablet 3   diazepam  (VALIUM ) 5 MG tablet Take 0.5 tablets (2.5 mg total) by mouth every 8 (eight) hours as needed. 30 tablet 0   Misc Natural Products (PROSTATE HEALTH PO) Take 250 mg by mouth in the morning and at bedtime. Super Beta Prostate     Omega-3-6-9 CAPS Take 1 capsule by mouth 2 (two) times daily.     potassium chloride  (KLOR-CON  M) 10 MEQ tablet Take 1 tablet (10 mEq total) by mouth 2 (two) times daily. 180 tablet 3   rivaroxaban  (XARELTO ) 20 MG TABS tablet Take 1 tablet (20 mg total) by mouth daily with supper. 30 tablet 5   sertraline  (ZOLOFT ) 50 MG tablet TAKE 1 TABLET BY MOUTH EVERY DAY 90 tablet 3   vitamin B-12 (CYANOCOBALAMIN) 500 MCG tablet Take 500 mcg by mouth daily.     Vitamin D3 (VITAMIN D) 25 MCG tablet Take 1,000 Units by mouth daily.     celecoxib  (CELEBREX ) 200 MG capsule Take 1 capsule (200 mg total) by mouth 2 (two) times daily. (Patient not taking: Reported on 01/17/2024) 30 capsule 0   cyclobenzaprine  (FLEXERIL ) 10 MG tablet Take 10 mg by mouth 3 (three) times daily. (Patient not taking: Reported on 09/03/2023)     methocarbamol  (ROBAXIN -750) 750 MG tablet Take 1 tablet (750 mg total) by mouth 4 (four) times daily. (Patient not taking: Reported on 09/03/2023) 30 tablet 0   Current Facility-Administered Medications  Medication Dose Route Frequency Provider Last Rate Last Admin   methylPREDNISolone  acetate (DEPO-MEDROL ) injection 40 mg  40 mg Intramuscular Once Normie Becton, FNP         Physical Exam    VS:  BP 136/78 (BP Location: Left Arm, Patient Position: Sitting, Cuff Size: Large)   Pulse 62   Resp 17   Ht 5\' 10"  (1.778 m)   Wt 244 lb (110.7 kg)   SpO2 96%   BMI 35.01 kg/m  , BMI Body mass index is 35.01 kg/m.       GEN: Well nourished, well developed, in no acute distress. HEENT: normal. Neck: Supple, no JVD, carotid bruits, or masses. Cardiac: Irregularly irregular, no murmurs, rubs, or gallops. No clubbing, cyanosis, 1+ bilateral ankle  edema.  Radials 2+/PT 2+ and equal bilaterally.  Respiratory:  Respirations regular and unlabored, clear to auscultation bilaterally. GI: Obese, soft, nontender, nondistended, BS + x 4. MS: no deformity or atrophy. Skin: warm and dry, no rash. Neuro:  Strength and sensation are intact. Psych: Normal affect.  Accessory Clinical Findings    ECG personally reviewed by me today - EKG Interpretation Date/Time:  Friday Jan 17 2024 11:23:10 EDT Ventricular Rate:  76 PR Interval:    QRS Duration:  86 QT Interval:  376 QTC Calculation: 423 R Axis:   34  Text Interpretation: Atrial fibrillation ST & T wave abnormality, consider inferior ischemia Confirmed by Laneta Pintos [phone]) on 01/17/2024 11:28:16 AM  - no acute changes.  Labs from Care Everywhere dated Jan 15, 2024:  Sodium 141, potassium 3.6, chloride 99, CO2 35.2, BUN 12, creatinine 0.8, glucose 103 Calcium  9.6, albumin 4.5, total protein 6.9 Total bilirubin 2.4, alkaline phosphatase 95, AST 25, ALT 20 Total cholesterol 127, triglycerides 129, HDL 37, LDL 64 Hemoglobin L9F 6.0  Lab Results  Component Value Date   WBC 9.1 09/02/2023   HGB 14.1 09/02/2023   HCT 40.9 09/02/2023   MCV 90.5 09/02/2023   PLT 214 09/02/2023       Assessment & Plan    1.  Permanent atrial fibrillation: Well rate controlled on beta-blocker therapy.  He is chronically anticoagulated on Xarelto  with stable lab work earlier this year.  He is asymptomatic.  No changes today.  2.  Dyspnea on exertion: Beginning last year, patient noted dyspnea when mowing his lawn so he stopped doing and hired someone else.  He notes he is very sedentary and will still have some dyspnea on exertion with higher levels of activity.  We agreed to obtain an echocardiogram to reevaluate LV function.  3.  Primary hypertension: Blood pressure stable today on beta-blocker, chlorthalidone .  4.  Hyperlipidemia:  LDL of 64 earlier this week.  Normal LFTs.  He remains on  atorvastatin  therapy.  5.  Obstructive sleep apnea: On CPAP.  6.  Lower extremity edema: Mild lower extremity swelling on examination.  He notes this is chronic.  He is not careful with his sodium intake.  We discussed the importance of sodium restriction, leg elevation and consideration for compression.  He remains on chlorthalidone  therapy.  7.  Disposition: Follow-up echocardiogram.  If stable, patient will follow-up in 1 year.  Laneta Pintos, NP 01/17/2024, 11:57 AM

## 2024-01-17 ENCOUNTER — Encounter: Payer: Self-pay | Admitting: Nurse Practitioner

## 2024-01-17 ENCOUNTER — Ambulatory Visit: Attending: Nurse Practitioner | Admitting: Nurse Practitioner

## 2024-01-17 VITALS — BP 136/78 | HR 62 | Resp 17 | Ht 70.0 in | Wt 244.0 lb

## 2024-01-17 DIAGNOSIS — R0609 Other forms of dyspnea: Secondary | ICD-10-CM | POA: Diagnosis not present

## 2024-01-17 DIAGNOSIS — I4821 Permanent atrial fibrillation: Secondary | ICD-10-CM

## 2024-01-17 DIAGNOSIS — I10 Essential (primary) hypertension: Secondary | ICD-10-CM | POA: Diagnosis not present

## 2024-01-17 DIAGNOSIS — E7800 Pure hypercholesterolemia, unspecified: Secondary | ICD-10-CM | POA: Diagnosis not present

## 2024-01-17 DIAGNOSIS — R600 Localized edema: Secondary | ICD-10-CM | POA: Diagnosis not present

## 2024-01-17 NOTE — Patient Instructions (Addendum)
 Medication Instructions:  Your physician recommends that you continue on your current medications as directed. Please refer to the Current Medication list given to you today.  *If you need a refill on your cardiac medications before your next appointment, please call your pharmacy*  Lab Work: No labs ordered today  If you have labs (blood work) drawn today and your tests are completely normal, you will receive your results only by: MyChart Message (if you have MyChart) OR A paper copy in the mail If you have any lab test that is abnormal or we need to change your treatment, we will call you to review the results.  Testing/Procedures: Your physician has requested that you have an echocardiogram. Echocardiography is a painless test that uses sound waves to create images of your heart. It provides your doctor with information about the size and shape of your heart and how well your heart's chambers and valves are working.   You may receive an ultrasound enhancing agent through an IV if needed to better visualize your heart during the echo. This procedure takes approximately one hour.  There are no restrictions for this procedure.  This will take place at [address] [facility] [address], [place] [address]  Please note: We ask at that you not bring children with you during ultrasound (echo/ vascular) testing. Due to room size and safety concerns, children are not allowed in the ultrasound rooms during exams. Our front office staff cannot provide observation of children in our lobby area while testing is being conducted. An adult accompanying a patient to their appointment will only be allowed in the ultrasound room at the discretion of the ultrasound technician under special circumstances. We apologize for any inconvenience.   Follow-Up: At [facility], you and your health needs are our priority.  As part of our continuing mission to provide you with exceptional heart  care, our providers are all part of one team.  This team includes your primary Cardiologist (physician) and Advanced Practice Providers or APPs (Physician Assistants and Nurse Practitioners) who all work together to provide you with the care you need, when you need it.  Your next appointment:   1 year(s)  Provider:   You may see Constancia Delton, MD or one of the following Advanced Practice Providers on your designated Care Team:   Laneta Pintos, NP Gildardo Labrador, PA-C Varney Gentleman, PA-C Cadence [place], PA-C Ronald Cockayne, NP Morey Ar, NP

## 2024-01-29 ENCOUNTER — Other Ambulatory Visit: Payer: Self-pay | Admitting: Cardiology

## 2024-01-29 DIAGNOSIS — I4819 Other persistent atrial fibrillation: Secondary | ICD-10-CM

## 2024-01-29 NOTE — Telephone Encounter (Signed)
 Prescription refill request for Xarelto  received.  Indication:afib Last office visit:5/25 Weight:110.7  kg Age:73 Scr:0.8  5/25 CrCl:126.84  ml/min  Prescription refilled

## 2024-02-03 ENCOUNTER — Other Ambulatory Visit

## 2024-02-12 ENCOUNTER — Ambulatory Visit

## 2024-02-25 ENCOUNTER — Ambulatory Visit: Attending: Nurse Practitioner

## 2024-02-25 DIAGNOSIS — R0609 Other forms of dyspnea: Secondary | ICD-10-CM

## 2024-02-25 DIAGNOSIS — I4821 Permanent atrial fibrillation: Secondary | ICD-10-CM

## 2024-02-25 LAB — ECHOCARDIOGRAM COMPLETE
AR max vel: 1.53 cm2
AV Area VTI: 1.86 cm2
AV Area mean vel: 1.43 cm2
AV Mean grad: 5 mmHg
AV Peak grad: 9.2 mmHg
Ao pk vel: 1.52 m/s
Area-P 1/2: 4.21 cm2
Calc EF: 53.6 %
S' Lateral: 2.8 cm
Single Plane A2C EF: 53.6 %
Single Plane A4C EF: 54.5 %

## 2024-02-26 ENCOUNTER — Telehealth: Payer: Self-pay | Admitting: Cardiology

## 2024-02-26 ENCOUNTER — Ambulatory Visit: Payer: Self-pay | Admitting: Nurse Practitioner

## 2024-02-26 NOTE — Telephone Encounter (Signed)
 Calling to make provider aware that fax is being sent today regarding documents for verification of Chronic Condition for pt. Please advise

## 2024-05-22 ENCOUNTER — Other Ambulatory Visit: Payer: Self-pay | Admitting: Cardiology

## 2024-07-24 ENCOUNTER — Other Ambulatory Visit: Payer: Self-pay | Admitting: Cardiology

## 2024-07-24 DIAGNOSIS — I4819 Other persistent atrial fibrillation: Secondary | ICD-10-CM

## 2024-07-27 NOTE — Telephone Encounter (Signed)
 Prescription refill request for Xarelto  received.  Indication: AF Last office visit: 5/25 Weight: 110.7 kg Age: 73 Scr: 0.8 CrCl: 127
